# Patient Record
Sex: Female | Born: 1945
Health system: Southern US, Community
[De-identification: ages and names within clinical notes are randomized; demographics above are authoritative.]

## PROBLEM LIST (undated history)

## (undated) DIAGNOSIS — M1990 Unspecified osteoarthritis, unspecified site: Secondary | ICD-10-CM

## (undated) DIAGNOSIS — Z9889 Other specified postprocedural states: Secondary | ICD-10-CM

## (undated) DIAGNOSIS — IMO0001 Reserved for inherently not codable concepts without codable children: Secondary | ICD-10-CM

## (undated) DIAGNOSIS — K219 Gastro-esophageal reflux disease without esophagitis: Secondary | ICD-10-CM

## (undated) DIAGNOSIS — R7302 Impaired glucose tolerance (oral): Secondary | ICD-10-CM

## (undated) DIAGNOSIS — M8580 Other specified disorders of bone density and structure, unspecified site: Secondary | ICD-10-CM

## (undated) DIAGNOSIS — J45909 Unspecified asthma, uncomplicated: Secondary | ICD-10-CM

## (undated) DIAGNOSIS — I10 Essential (primary) hypertension: Secondary | ICD-10-CM

## (undated) DIAGNOSIS — R7301 Impaired fasting glucose: Secondary | ICD-10-CM

## (undated) DIAGNOSIS — F419 Anxiety disorder, unspecified: Secondary | ICD-10-CM

## (undated) DIAGNOSIS — R112 Nausea with vomiting, unspecified: Secondary | ICD-10-CM

## (undated) DIAGNOSIS — J189 Pneumonia, unspecified organism: Secondary | ICD-10-CM

## (undated) DIAGNOSIS — T7840XA Allergy, unspecified, initial encounter: Secondary | ICD-10-CM

## (undated) DIAGNOSIS — E782 Mixed hyperlipidemia: Secondary | ICD-10-CM

## (undated) HISTORY — DX: Allergy, unspecified, initial encounter: T78.40XA

## (undated) HISTORY — DX: Gastro-esophageal reflux disease without esophagitis: K21.9

## (undated) HISTORY — DX: Other specified disorders of bone density and structure, unspecified site: M85.80

## (undated) HISTORY — PX: OTHER SURGICAL HISTORY: SHX169

## (undated) HISTORY — DX: Essential (primary) hypertension: I10

## (undated) HISTORY — DX: Anxiety disorder, unspecified: F41.9

## (undated) HISTORY — DX: Mixed hyperlipidemia: E78.2

## (undated) HISTORY — PX: SHOULDER SURGERY: SHX246

## (undated) HISTORY — DX: Impaired glucose tolerance (oral): R73.02

## (undated) HISTORY — PX: CARPAL TUNNEL RELEASE: SHX101

## (undated) HISTORY — PX: TUBAL LIGATION: SHX77

## (undated) HISTORY — DX: Impaired fasting glucose: R73.01

## (undated) HISTORY — DX: Reserved for inherently not codable concepts without codable children: IMO0001

---

## 2000-10-24 ENCOUNTER — Encounter: Payer: Self-pay | Admitting: *Deleted

## 2000-10-24 ENCOUNTER — Ambulatory Visit (HOSPITAL_COMMUNITY): Admission: RE | Admit: 2000-10-24 | Discharge: 2000-10-24 | Payer: Self-pay | Admitting: *Deleted

## 2001-11-12 ENCOUNTER — Ambulatory Visit (HOSPITAL_COMMUNITY): Admission: RE | Admit: 2001-11-12 | Discharge: 2001-11-12 | Payer: Self-pay | Admitting: *Deleted

## 2001-11-12 ENCOUNTER — Encounter: Payer: Self-pay | Admitting: *Deleted

## 2002-11-18 ENCOUNTER — Encounter: Payer: Self-pay | Admitting: *Deleted

## 2002-11-18 ENCOUNTER — Ambulatory Visit (HOSPITAL_COMMUNITY): Admission: RE | Admit: 2002-11-18 | Discharge: 2002-11-18 | Payer: Self-pay | Admitting: *Deleted

## 2003-03-04 ENCOUNTER — Ambulatory Visit (HOSPITAL_COMMUNITY): Admission: RE | Admit: 2003-03-04 | Discharge: 2003-03-04 | Payer: Self-pay | Admitting: Family Medicine

## 2003-07-22 ENCOUNTER — Ambulatory Visit (HOSPITAL_COMMUNITY): Admission: RE | Admit: 2003-07-22 | Discharge: 2003-07-22 | Payer: Self-pay | Admitting: *Deleted

## 2003-09-14 ENCOUNTER — Ambulatory Visit (HOSPITAL_COMMUNITY): Admission: RE | Admit: 2003-09-14 | Discharge: 2003-09-14 | Payer: Self-pay | Admitting: Internal Medicine

## 2003-12-14 ENCOUNTER — Ambulatory Visit (HOSPITAL_COMMUNITY): Admission: RE | Admit: 2003-12-14 | Discharge: 2003-12-14 | Payer: Self-pay | Admitting: *Deleted

## 2004-01-17 ENCOUNTER — Ambulatory Visit (HOSPITAL_COMMUNITY): Admission: RE | Admit: 2004-01-17 | Discharge: 2004-01-17 | Payer: Self-pay | Admitting: Family Medicine

## 2004-12-14 ENCOUNTER — Ambulatory Visit (HOSPITAL_COMMUNITY): Admission: RE | Admit: 2004-12-14 | Discharge: 2004-12-14 | Payer: Self-pay | Admitting: *Deleted

## 2005-12-17 ENCOUNTER — Ambulatory Visit (HOSPITAL_COMMUNITY): Admission: RE | Admit: 2005-12-17 | Discharge: 2005-12-17 | Payer: Self-pay | Admitting: Obstetrics and Gynecology

## 2006-12-19 ENCOUNTER — Ambulatory Visit (HOSPITAL_COMMUNITY): Admission: RE | Admit: 2006-12-19 | Discharge: 2006-12-19 | Payer: Self-pay | Admitting: Specialist

## 2007-09-03 ENCOUNTER — Other Ambulatory Visit: Admission: RE | Admit: 2007-09-03 | Discharge: 2007-09-03 | Payer: Self-pay | Admitting: Obstetrics and Gynecology

## 2007-12-25 ENCOUNTER — Ambulatory Visit (HOSPITAL_COMMUNITY): Admission: RE | Admit: 2007-12-25 | Discharge: 2007-12-25 | Payer: Self-pay | Admitting: Obstetrics and Gynecology

## 2008-09-03 ENCOUNTER — Other Ambulatory Visit: Admission: RE | Admit: 2008-09-03 | Discharge: 2008-09-03 | Payer: Self-pay | Admitting: Obstetrics and Gynecology

## 2008-12-27 ENCOUNTER — Ambulatory Visit (HOSPITAL_COMMUNITY): Admission: RE | Admit: 2008-12-27 | Discharge: 2008-12-27 | Payer: Self-pay | Admitting: Obstetrics and Gynecology

## 2009-08-17 ENCOUNTER — Other Ambulatory Visit: Admission: RE | Admit: 2009-08-17 | Discharge: 2009-08-17 | Payer: Self-pay | Admitting: Obstetrics and Gynecology

## 2009-12-29 ENCOUNTER — Ambulatory Visit (HOSPITAL_COMMUNITY): Admission: RE | Admit: 2009-12-29 | Discharge: 2009-12-29 | Payer: Self-pay | Admitting: Obstetrics and Gynecology

## 2010-01-04 ENCOUNTER — Ambulatory Visit (HOSPITAL_COMMUNITY): Admission: RE | Admit: 2010-01-04 | Discharge: 2010-01-04 | Payer: Self-pay | Admitting: Obstetrics and Gynecology

## 2010-08-30 ENCOUNTER — Other Ambulatory Visit (HOSPITAL_COMMUNITY)
Admission: RE | Admit: 2010-08-30 | Discharge: 2010-08-30 | Disposition: A | Discharge status: Home / Self Care | Payer: Medicare Other | Source: Ambulatory Visit | Attending: Obstetrics & Gynecology | Admitting: Obstetrics & Gynecology

## 2010-08-30 DIAGNOSIS — Z124 Encounter for screening for malignant neoplasm of cervix: Secondary | ICD-10-CM | POA: Insufficient documentation

## 2010-09-22 NOTE — Op Note (Signed)
Breanna Mason, KEMMERER                       ACCOUNT NO.:  [id number]   MEDICAL RECORD NO.:  [id number]                   PATIENT TYPE:  AMB   LOCATION:  DAY                                  FACILITY:  [facility]   PHYSICIAN:  Lionel December, M.D.                 DATE OF BIRTH:  04-15-1946   DATE OF PROCEDURE:  09/14/2003  DATE OF DISCHARGE:                                 OPERATIVE REPORT   PROCEDURE:  Total colonoscopy.   INDICATIONS:  Kalsey is a 65 year old Caucasian female who is undergoing  screening colonoscopy.  Procedure was reviewed with the patient and informed  consent was obtained.   PREOPERATIVE MEDICATIONS:  Demerol 50 mg IV, Versed 7 mg IV in divided  doses.   FINDINGS:  The procedure was performed in the endoscopy suite.  The  patient's vital signs and O2 saturation were monitored during the procedure  and remained stable.  The patient was placed in the left lateral recumbent  position.  Rectal examination was performed.  No abnormality noted no  external or digital exam.  Olympus video scope was placed in the rectum and  advanced under vision into the sigmoid colon which was somewhat tortuous.  Preparation was satisfactory.  The scope was passed to the cecum which was  identified by the ileocecal valve.  The blunt cecum was well seen and was  normal.  As the scope was withdrawn, colonic mucosa was carefully examined.  There was a single diverticulum at the hepatic flexure.  There was a 3 mm  polyp at proximal transverse colon which was obliterated by cold biopsy.  The mucosa of the rest of the colon was normal.  Scope was retroflexed to  examine the anorectal junction and small hemorrhoids were noted below the  dentate line.  The scope was straightened and withdrawn.  The patient  tolerated the procedure well.   FINAL DIAGNOSIS:  1. Single small polyp at the proximal transverse colon which was obliterated     by cold biopsy.  2. Single diverticulum at the hepatic  flexure.  3. Small external hemorrhoids.   RECOMMENDATIONS:  1. High-fiber diet.  2. I will be contacting the patient with biopsy results and further     recommendations.      ___________________________________________                                            Lionel December, M.D.   NR/MEDQ  D:  09/14/2003  T:  09/14/2003  Job:  [id number]   cc:   Donna Bernard, M.D.  [address]  [place]  [place] [address]  Fax: [phone]

## 2010-11-28 ENCOUNTER — Other Ambulatory Visit: Payer: Self-pay | Admitting: Obstetrics and Gynecology

## 2010-11-28 DIAGNOSIS — Z139 Encounter for screening, unspecified: Secondary | ICD-10-CM

## 2011-01-01 ENCOUNTER — Ambulatory Visit (HOSPITAL_COMMUNITY): Payer: 59

## 2011-01-09 ENCOUNTER — Ambulatory Visit (HOSPITAL_COMMUNITY)
Admission: RE | Admit: 2011-01-09 | Discharge: 2011-01-09 | Disposition: A | Discharge status: Home / Self Care | Payer: Medicare Other | Source: Ambulatory Visit | Attending: Obstetrics and Gynecology | Admitting: Obstetrics and Gynecology

## 2011-01-09 DIAGNOSIS — Z1231 Encounter for screening mammogram for malignant neoplasm of breast: Secondary | ICD-10-CM | POA: Insufficient documentation

## 2011-01-09 DIAGNOSIS — Z139 Encounter for screening, unspecified: Secondary | ICD-10-CM

## 2011-02-16 ENCOUNTER — Encounter: Payer: Self-pay | Admitting: Cardiology

## 2011-03-06 ENCOUNTER — Encounter: Payer: Self-pay | Admitting: Cardiology

## 2011-03-06 ENCOUNTER — Ambulatory Visit (INDEPENDENT_AMBULATORY_CARE_PROVIDER_SITE_OTHER): Payer: Medicare Other | Admitting: Cardiology

## 2011-03-06 DIAGNOSIS — I10 Essential (primary) hypertension: Secondary | ICD-10-CM

## 2011-03-06 DIAGNOSIS — R7302 Impaired glucose tolerance (oral): Secondary | ICD-10-CM | POA: Insufficient documentation

## 2011-03-06 DIAGNOSIS — R079 Chest pain, unspecified: Secondary | ICD-10-CM

## 2011-03-06 DIAGNOSIS — R7309 Other abnormal glucose: Secondary | ICD-10-CM

## 2011-03-06 DIAGNOSIS — E782 Mixed hyperlipidemia: Secondary | ICD-10-CM

## 2011-03-06 DIAGNOSIS — J45909 Unspecified asthma, uncomplicated: Secondary | ICD-10-CM

## 2011-03-06 NOTE — Assessment & Plan Note (Signed)
Could be that some of her symptoms are related to exercise-induced asthma.

## 2011-03-06 NOTE — Progress Notes (Signed)
Clinical Summary Breanna Mason is a 65 y.o.female referred for cardiology consultation by Dr. Gerda Diss. She reports a several month history of exertional chest tightness, specifically when she is walking on her treadmill at home. This occurs when she walks on an incline at speed greater than 3 miles per hour. She states that symptoms last for approximately 30 minutes prior to resolving with rest. She reports a history of asthma, and thought that this may be related. She states that she does not use her rescue inhaler with any regularity, does use Advair. Otherwise when she is walking on level ground, walking outdoors, she reports no exertional chest pain or unusual shortness of breath.  She reports compliance with her medications. Cardiac risk factors are reviewed. She has a low Framingham risk score. Recent labs show total cholesterol 208, HDL 53, triglycerides 92, LDL 137, AST 19, ALT 23.   Allergies  Allergen Reactions  . Penicillins     Medication list reviewed.  Past Medical History  Diagnosis Date  . Essential hypertension, benign   . GERD (gastroesophageal reflux disease)   . Asthma   . Mixed hyperlipidemia   . Impaired glucose tolerance   . Osteoporosis   . Anxiety     Past Surgical History  Procedure Date  . Cesarean section   . Bilateral tubal ligation   . Shoulder surgery     Family History  Problem Relation Age of Onset  . Hypertension Father   . Diabetes type II Father     Social History Breanna Mason reports that she has never smoked. She has never used smokeless tobacco. Breanna Mason reports that she does not drink alcohol.  Review of Systems No cough or hemoptysis. No fevers or chills. Reports stable appetite. No melena or hematochezia. No palpitations or syncope. Otherwise negative.  Physical Examination Filed Vitals:   03/06/11 0837  BP: 116/73  Pulse: 75   Normally nourished appearing woman in no acute distress. HEENT: Strabismus noted, oropharynx  clear with moist mucosa. Neck: Supple, no elevated JVP or carotid bruits, no thyromegaly. Lungs: Clear to auscultation, diminished breath sounds, no wheezing or labored breathing. Cardiac: Regular rate and rhythm, no significant murmur or gallop. Abdomen: Soft, nontender, bowel sounds present. Skin: Warm and dry. Musculoskeletal: No kyphosis. Extremities: No edema, limited range of motion right shoulder. Neuropsychiatric: Alert and oriented x3, affect appropriate.  ECG Recent tracing from October 19 reviewed showing sinus rhythm with normal intervals, nonspecific T-wave changes.   Problem List and Plan

## 2011-03-06 NOTE — Assessment & Plan Note (Signed)
On statin therapy, recent LDL 137. Ideally goal should be under 100.

## 2011-03-06 NOTE — Assessment & Plan Note (Signed)
Suggestive of angina based on description, and with cardiac risk factors including hypertension, hyperlipidemia, and impaired glucose tolerance. Framingham risk score is however low, and there is no definitive family history of premature CAD. Baseline ECG is normal. Further risk stratification is being pursued via exercise Myoview.

## 2011-03-06 NOTE — Assessment & Plan Note (Signed)
Followed by Dr.Luking. Hemoglobin A1c 6.0.

## 2011-03-06 NOTE — Patient Instructions (Signed)
Your physician has requested that you have en exercise stress myoview. For further information please visit [email or web address] Please follow instruction sheet, as given.  Your physician recommends that you continue on your current medications as directed. Please refer to the Current Medication list given to you today.  Your physician recommends that you schedule a follow-up appointment in: We will contact you with results of test

## 2011-03-06 NOTE — Assessment & Plan Note (Signed)
Blood pressure well-controlled today. 

## 2011-03-13 ENCOUNTER — Encounter (HOSPITAL_COMMUNITY)
Admission: RE | Admit: 2011-03-13 | Discharge: 2011-03-13 | Disposition: A | Discharge status: Home / Self Care | Payer: Medicare Other | Source: Ambulatory Visit | Attending: Cardiology | Admitting: Cardiology

## 2011-03-13 ENCOUNTER — Ambulatory Visit (INDEPENDENT_AMBULATORY_CARE_PROVIDER_SITE_OTHER): Payer: Medicare Other | Admitting: *Deleted

## 2011-03-13 ENCOUNTER — Encounter (HOSPITAL_COMMUNITY): Payer: Self-pay | Admitting: Cardiology

## 2011-03-13 DIAGNOSIS — R079 Chest pain, unspecified: Secondary | ICD-10-CM

## 2011-03-13 DIAGNOSIS — I10 Essential (primary) hypertension: Secondary | ICD-10-CM | POA: Insufficient documentation

## 2011-03-13 MED ORDER — TECHNETIUM TC 99M TETROFOSMIN IV KIT
30.0000 | PACK | Freq: Once | INTRAVENOUS | Status: AC | PRN
  Administered 2011-03-13: 30 via INTRAVENOUS

## 2011-03-13 MED ORDER — TECHNETIUM TC 99M TETROFOSMIN IV KIT
10.0000 | PACK | Freq: Once | INTRAVENOUS | Status: AC | PRN
  Administered 2011-03-13: 9.9 via INTRAVENOUS

## 2011-03-13 NOTE — Progress Notes (Signed)
Stress Lab Nurses Notes - [facility]  SUPRENA TRAVAGLINI 03/13/2011  Reason for doing test: Chest Pain  Type of test: Stress Myoview  Nurse performing test: Parke Poisson, RN  Nuclear Medicine Tech: Lyndel Pleasure  Echo Tech: Not Applicable  MD performing test: Ival Bible & Joni Reining, NP  Family MD: Lubertha South  Test explained and consent signed: yes  IV started: 22g jelco, Saline lock flushed, No redness or edema and Saline lock started in radiology  Symptoms: SOB & fatigue  Treatment/Intervention: None  Reason test stopped: fatigue  After recovery IV was: Discontinued via X-ray tech and No redness or edema  Patient to return to Nuc. Med at : 73 Noon  Patient discharged: Home  Patient's Condition upon discharge was: stable  Comments: During test peak BP 178/72 & HR 151.  Recovery BP 128/70 & HR 100.  Symptoms resolved in recovery.  [name]

## 2011-05-15 DIAGNOSIS — IMO0002 Reserved for concepts with insufficient information to code with codable children: Secondary | ICD-10-CM | POA: Diagnosis not present

## 2011-06-21 DIAGNOSIS — H5200 Hypermetropia, unspecified eye: Secondary | ICD-10-CM | POA: Diagnosis not present

## 2011-06-21 DIAGNOSIS — H35369 Drusen (degenerative) of macula, unspecified eye: Secondary | ICD-10-CM | POA: Diagnosis not present

## 2011-06-21 DIAGNOSIS — H524 Presbyopia: Secondary | ICD-10-CM | POA: Diagnosis not present

## 2011-08-27 DIAGNOSIS — J218 Acute bronchiolitis due to other specified organisms: Secondary | ICD-10-CM | POA: Diagnosis not present

## 2011-08-27 DIAGNOSIS — J301 Allergic rhinitis due to pollen: Secondary | ICD-10-CM | POA: Diagnosis not present

## 2011-08-27 DIAGNOSIS — J988 Other specified respiratory disorders: Secondary | ICD-10-CM | POA: Diagnosis not present

## 2011-08-31 DIAGNOSIS — R7301 Impaired fasting glucose: Secondary | ICD-10-CM | POA: Diagnosis not present

## 2011-08-31 DIAGNOSIS — E782 Mixed hyperlipidemia: Secondary | ICD-10-CM | POA: Diagnosis not present

## 2011-08-31 DIAGNOSIS — Z79899 Other long term (current) drug therapy: Secondary | ICD-10-CM | POA: Diagnosis not present

## 2011-09-06 DIAGNOSIS — I10 Essential (primary) hypertension: Secondary | ICD-10-CM | POA: Diagnosis not present

## 2011-09-06 DIAGNOSIS — R7301 Impaired fasting glucose: Secondary | ICD-10-CM | POA: Diagnosis not present

## 2011-09-06 DIAGNOSIS — J45909 Unspecified asthma, uncomplicated: Secondary | ICD-10-CM | POA: Diagnosis not present

## 2011-09-06 DIAGNOSIS — E785 Hyperlipidemia, unspecified: Secondary | ICD-10-CM | POA: Diagnosis not present

## 2011-09-20 DIAGNOSIS — L570 Actinic keratosis: Secondary | ICD-10-CM | POA: Diagnosis not present

## 2011-12-10 ENCOUNTER — Other Ambulatory Visit: Payer: Self-pay | Admitting: Obstetrics and Gynecology

## 2011-12-10 DIAGNOSIS — Z139 Encounter for screening, unspecified: Secondary | ICD-10-CM

## 2012-01-14 ENCOUNTER — Ambulatory Visit (HOSPITAL_COMMUNITY)
Admission: RE | Admit: 2012-01-14 | Discharge: 2012-01-14 | Disposition: A | Discharge status: Home / Self Care | Payer: Medicare Other | Source: Ambulatory Visit | Attending: Obstetrics and Gynecology | Admitting: Obstetrics and Gynecology

## 2012-01-14 DIAGNOSIS — Z1231 Encounter for screening mammogram for malignant neoplasm of breast: Secondary | ICD-10-CM | POA: Diagnosis not present

## 2012-01-14 DIAGNOSIS — Z139 Encounter for screening, unspecified: Secondary | ICD-10-CM

## 2012-01-24 DIAGNOSIS — M19019 Primary osteoarthritis, unspecified shoulder: Secondary | ICD-10-CM | POA: Diagnosis not present

## 2012-02-27 DIAGNOSIS — Z79899 Other long term (current) drug therapy: Secondary | ICD-10-CM | POA: Diagnosis not present

## 2012-02-27 DIAGNOSIS — R7301 Impaired fasting glucose: Secondary | ICD-10-CM | POA: Diagnosis not present

## 2012-02-27 DIAGNOSIS — E782 Mixed hyperlipidemia: Secondary | ICD-10-CM | POA: Diagnosis not present

## 2012-03-04 DIAGNOSIS — Z23 Encounter for immunization: Secondary | ICD-10-CM | POA: Diagnosis not present

## 2012-03-05 DIAGNOSIS — N390 Urinary tract infection, site not specified: Secondary | ICD-10-CM | POA: Diagnosis not present

## 2012-03-19 DIAGNOSIS — I10 Essential (primary) hypertension: Secondary | ICD-10-CM | POA: Diagnosis not present

## 2012-03-19 DIAGNOSIS — E785 Hyperlipidemia, unspecified: Secondary | ICD-10-CM | POA: Diagnosis not present

## 2012-03-19 DIAGNOSIS — R7301 Impaired fasting glucose: Secondary | ICD-10-CM | POA: Diagnosis not present

## 2012-05-08 DIAGNOSIS — K2970 Gastritis, unspecified, without bleeding: Secondary | ICD-10-CM | POA: Diagnosis not present

## 2012-08-14 DIAGNOSIS — Z0289 Encounter for other administrative examinations: Secondary | ICD-10-CM

## 2012-08-18 ENCOUNTER — Telehealth: Payer: Self-pay | Admitting: Family Medicine

## 2012-08-18 DIAGNOSIS — E782 Mixed hyperlipidemia: Secondary | ICD-10-CM

## 2012-08-18 DIAGNOSIS — Z79899 Other long term (current) drug therapy: Secondary | ICD-10-CM

## 2012-08-18 NOTE — Telephone Encounter (Signed)
Patient needs BW paperwork °

## 2012-08-19 DIAGNOSIS — Z79899 Other long term (current) drug therapy: Secondary | ICD-10-CM | POA: Diagnosis not present

## 2012-08-19 DIAGNOSIS — E782 Mixed hyperlipidemia: Secondary | ICD-10-CM | POA: Diagnosis not present

## 2012-08-19 NOTE — Telephone Encounter (Signed)
Lip liv met 7 

## 2012-08-19 NOTE — Telephone Encounter (Signed)
bw papers ready to be pick up. Pt notified on voicemail

## 2012-08-26 DIAGNOSIS — Z79899 Other long term (current) drug therapy: Secondary | ICD-10-CM | POA: Diagnosis not present

## 2012-08-26 DIAGNOSIS — E782 Mixed hyperlipidemia: Secondary | ICD-10-CM | POA: Diagnosis not present

## 2012-08-26 LAB — HEPATIC FUNCTION PANEL
ALT: 24 U/L (ref 0–35)
AST: 18 U/L (ref 0–37)
Albumin: 4.2 g/dL (ref 3.5–5.2)
Alkaline Phosphatase: 38 U/L — ABNORMAL LOW (ref 39–117)
Bilirubin, Direct: 0.1 mg/dL (ref 0.0–0.3)
Indirect Bilirubin: 0.5 mg/dL (ref 0.0–0.9)
Total Bilirubin: 0.6 mg/dL (ref 0.3–1.2)
Total Protein: 6.6 g/dL (ref 6.0–8.3)

## 2012-08-26 LAB — LIPID PANEL
Cholesterol: 172 mg/dL (ref 0–200)
HDL: 51 mg/dL (ref >39)
LDL Cholesterol: 106 mg/dL — ABNORMAL HIGH (ref 0–99)
Total CHOL/HDL Ratio: 3.4 Ratio
Triglycerides: 74 mg/dL (ref <150)
VLDL: 15 mg/dL (ref 0–40)

## 2012-08-26 LAB — BASIC METABOLIC PANEL
BUN: 15 mg/dL (ref 6–23)
CO2: 25 mEq/L (ref 19–32)
Calcium: 9 mg/dL (ref 8.4–10.5)
Chloride: 107 mEq/L (ref 96–112)
Creat: 0.67 mg/dL (ref 0.50–1.10)
Glucose, Bld: 112 mg/dL — ABNORMAL HIGH (ref 70–99)
Potassium: 4.3 mEq/L (ref 3.5–5.3)
Sodium: 139 mEq/L (ref 135–145)

## 2012-09-05 ENCOUNTER — Encounter: Payer: Self-pay | Admitting: Family Medicine

## 2012-09-05 ENCOUNTER — Ambulatory Visit (INDEPENDENT_AMBULATORY_CARE_PROVIDER_SITE_OTHER): Payer: Medicare Other | Admitting: Family Medicine

## 2012-09-05 VITALS — BP 108/77 | HR 60 | Wt 147.8 lb

## 2012-09-05 DIAGNOSIS — E782 Mixed hyperlipidemia: Secondary | ICD-10-CM

## 2012-09-05 DIAGNOSIS — R7302 Impaired glucose tolerance (oral): Secondary | ICD-10-CM

## 2012-09-05 DIAGNOSIS — J45909 Unspecified asthma, uncomplicated: Secondary | ICD-10-CM | POA: Diagnosis not present

## 2012-09-05 DIAGNOSIS — K219 Gastro-esophageal reflux disease without esophagitis: Secondary | ICD-10-CM

## 2012-09-05 DIAGNOSIS — I10 Essential (primary) hypertension: Secondary | ICD-10-CM | POA: Diagnosis not present

## 2012-09-05 DIAGNOSIS — R7309 Other abnormal glucose: Secondary | ICD-10-CM

## 2012-09-05 NOTE — Progress Notes (Signed)
  Subjective:    Patient ID: Breanna Mason, female    DOB: 1945/09/16, 67 y.o.   MRN: [id number]  HPI  patient arrives office for followup of many concerns. She claims compliance with all of her medications. No obvious side effects from the medicine.  Patient has chronic rhinitis. This is also complicated by true asthma. Patient reports compliance with her medicines. Overall her breathing is doing well.  Patient claims compliance with her blood pressure medicine. No headache or chest pain. Decent appetite. Walking some. Watch her salt intake.  Patient reports her reflux is stable as long she stays on her medication.  Trying to watch fats in her diet has cut down her cholesterol intake. Exercising regularly.    Review of Systems    review systems otherwise negative. Objective:   Physical Exam Alert no acute distress. Vitals reviewed. HEENT normal. Lungs clear. Heart regular in rhythm. Abdomen benign. Neuro intact. Ankles without edema.   Results for orders placed in visit on 08/18/12  LIPID PANEL      Result Value Range   Cholesterol 172  0 - 200 mg/dL   Triglycerides 74  <782 mg/dL   HDL 51  >95 mg/dL   Total CHOL/HDL Ratio 3.4     VLDL 15  0 - 40 mg/dL   LDL Cholesterol 621 (*) 0 - 99 mg/dL  HEPATIC FUNCTION PANEL      Result Value Range   Total Bilirubin 0.6  0.3 - 1.2 mg/dL   Bilirubin, Direct 0.1  0.0 - 0.3 mg/dL   Indirect Bilirubin 0.5  0.0 - 0.9 mg/dL   Alkaline Phosphatase 38 (*) 39 - 117 U/L   AST 18  0 - 37 U/L   ALT 24  0 - 35 U/L   Total Protein 6.6  6.0 - 8.3 g/dL   Albumin 4.2  3.5 - 5.2 g/dL  BASIC METABOLIC PANEL      Result Value Range   Sodium 139  135 - 145 mEq/L   Potassium 4.3  3.5 - 5.3 mEq/L   Chloride 107  96 - 112 mEq/L   CO2 25  19 - 32 mEq/L   Glucose, Bld 112 (*) 70 - 99 mg/dL   BUN 15  6 - 23 mg/dL   Creat 3.08  6.57 - 8.46 mg/dL   Calcium 9.0  8.4 - 96.2 mg/dL       Assessment & Plan:  Impression 1 hypertension good control. #2  hyperlipidemia good control result reviewed. #3 glucose intolerance ongoing discussed. #4 reflux clinically stable. #5 asthma stable. Plan maintain all meds. Diet exercise discussed. Recheck in 6 months. Medications maintained. WSL

## 2012-09-07 DIAGNOSIS — K219 Gastro-esophageal reflux disease without esophagitis: Secondary | ICD-10-CM | POA: Insufficient documentation

## 2012-09-09 ENCOUNTER — Encounter: Payer: Self-pay | Admitting: *Deleted

## 2012-10-15 ENCOUNTER — Other Ambulatory Visit: Payer: Self-pay | Admitting: Obstetrics and Gynecology

## 2012-10-17 ENCOUNTER — Other Ambulatory Visit (HOSPITAL_COMMUNITY)
Admission: RE | Admit: 2012-10-17 | Discharge: 2012-10-17 | Disposition: A | Discharge status: Home / Self Care | Payer: Medicare Other | Source: Ambulatory Visit | Attending: Obstetrics and Gynecology | Admitting: Obstetrics and Gynecology

## 2012-10-17 ENCOUNTER — Encounter: Payer: Self-pay | Admitting: Obstetrics and Gynecology

## 2012-10-17 ENCOUNTER — Ambulatory Visit (INDEPENDENT_AMBULATORY_CARE_PROVIDER_SITE_OTHER): Payer: Medicare Other | Admitting: Obstetrics and Gynecology

## 2012-10-17 VITALS — BP 120/80 | Ht 63.0 in | Wt 152.2 lb

## 2012-10-17 DIAGNOSIS — Z124 Encounter for screening for malignant neoplasm of cervix: Secondary | ICD-10-CM

## 2012-10-17 DIAGNOSIS — Z01419 Encounter for gynecological examination (general) (routine) without abnormal findings: Secondary | ICD-10-CM | POA: Insufficient documentation

## 2012-10-17 DIAGNOSIS — Z1151 Encounter for screening for human papillomavirus (HPV): Secondary | ICD-10-CM | POA: Diagnosis not present

## 2012-10-17 NOTE — Progress Notes (Signed)
Patient ID: Breanna Mason, female   DOB: 24-Jan-1946, 67 y.o.   MRN: [id number] Pt here today for annual pap and physical. Pt states she is doing well, has some burning with intercourse. Pt denies any issues at this time.    [facility] Visit  Patient name: Breanna Mason MRN [id number]  Date of birth: Jul 05, 1945  CC & HPI:  Breanna Mason is a 67 y.o. female presenting today for annual visit No hormone tx, none desired No gyn probs at present  ROS:    Pertinent History Reviewed:  Medical & Surgical Hx:  Reviewed: Significant for see pmh Medications: Reviewed & Updated - see associated section Social History: Reviewed -  reports that she has never smoked. She has never used smokeless tobacco.  Objective Findings:  Vitals: BP 120/80  Ht 5\' 3"  (1.6 m)  Wt 69.037 kg (152 lb 3.2 oz)  BMI 26.97 kg/m2  Physical Examination: General appearance - alert, well appearing, and in no distress, oriented to person, place, and time, normal appearing weight and well hydrated Mental status - alert, oriented to person, place, and time, normal mood, behavior, speech, dress, motor activity, and thought processes Mouth - mucous membranes moist, pharynx normal without lesions Neck - supple, no significant adenopathy Heart - normal rate and regular rhythm Abdomen - soft, nontender, nondistended, no masses or organomegaly Breasts - breasts appear normal, no suspicious masses, no skin or nipple changes or axillary nodes Pelvic - normal external genitalia, vulva, vagina, cervix, uterus and adnexa Extremities - peripheral pulses normal, no pedal edema, no clubbing or cyanosis, no pedal edema noted    Assessment & Plan:    gyn exam with pap Labs thru work/ primary care Pap q 3-5 yr

## 2012-10-24 ENCOUNTER — Other Ambulatory Visit: Payer: Medicare Other | Admitting: Obstetrics and Gynecology

## 2012-11-03 ENCOUNTER — Other Ambulatory Visit: Payer: Self-pay

## 2012-11-03 MED ORDER — PRAVASTATIN SODIUM 40 MG PO TABS: 40.0000 mg | ORAL_TABLET | Freq: Every day | ORAL | Status: DC

## 2012-11-24 ENCOUNTER — Other Ambulatory Visit: Payer: Self-pay | Admitting: Family Medicine

## 2012-12-10 ENCOUNTER — Other Ambulatory Visit: Payer: Self-pay

## 2012-12-15 ENCOUNTER — Other Ambulatory Visit: Payer: Self-pay | Admitting: Obstetrics and Gynecology

## 2012-12-15 DIAGNOSIS — Z139 Encounter for screening, unspecified: Secondary | ICD-10-CM

## 2013-01-16 ENCOUNTER — Ambulatory Visit (HOSPITAL_COMMUNITY)
Admission: RE | Admit: 2013-01-16 | Discharge: 2013-01-16 | Disposition: A | Discharge status: Home / Self Care | Payer: Medicare Other | Source: Ambulatory Visit | Attending: Obstetrics and Gynecology | Admitting: Obstetrics and Gynecology

## 2013-01-16 DIAGNOSIS — Z1231 Encounter for screening mammogram for malignant neoplasm of breast: Secondary | ICD-10-CM | POA: Diagnosis not present

## 2013-01-16 DIAGNOSIS — Z139 Encounter for screening, unspecified: Secondary | ICD-10-CM

## 2013-01-26 ENCOUNTER — Other Ambulatory Visit: Payer: Self-pay | Admitting: Obstetrics and Gynecology

## 2013-01-26 ENCOUNTER — Other Ambulatory Visit: Payer: Self-pay | Admitting: Family Medicine

## 2013-01-26 NOTE — Telephone Encounter (Signed)
Ok plus 5 monthly ref 

## 2013-01-30 ENCOUNTER — Other Ambulatory Visit: Payer: Self-pay | Admitting: *Deleted

## 2013-01-30 ENCOUNTER — Other Ambulatory Visit: Payer: Self-pay | Admitting: Family Medicine

## 2013-01-30 ENCOUNTER — Telehealth: Payer: Self-pay | Admitting: Family Medicine

## 2013-01-30 MED ORDER — ALPRAZOLAM 0.5 MG PO TABS: ORAL_TABLET | ORAL | Status: DC

## 2013-01-30 NOTE — Telephone Encounter (Signed)
Rx was already faxed to pharmacy.  Patient notified.

## 2013-01-30 NOTE — Telephone Encounter (Signed)
Ok plus 2 ref 

## 2013-01-30 NOTE — Telephone Encounter (Signed)
Patient needs Rx for alprazolam to [facility] is saying they do not have it.

## 2013-02-09 ENCOUNTER — Telehealth: Payer: Self-pay | Admitting: Family Medicine

## 2013-02-09 DIAGNOSIS — Z79899 Other long term (current) drug therapy: Secondary | ICD-10-CM

## 2013-02-09 DIAGNOSIS — E782 Mixed hyperlipidemia: Secondary | ICD-10-CM

## 2013-02-09 NOTE — Telephone Encounter (Signed)
Patient has appointment 02/25/2013 for Follow Up.  Patient would like to have blood work completed this coming Saturday 02/14/2013.  Please call Patient when this has been called to the lab.

## 2013-02-12 NOTE — Telephone Encounter (Signed)
FYI, patient wants done at [facility]

## 2013-02-12 NOTE — Telephone Encounter (Signed)
Lip liv m7 

## 2013-02-13 DIAGNOSIS — Z23 Encounter for immunization: Secondary | ICD-10-CM | POA: Diagnosis not present

## 2013-02-13 NOTE — Telephone Encounter (Signed)
Blood work ordered in Epic. Patient notified. 

## 2013-02-17 DIAGNOSIS — Z79899 Other long term (current) drug therapy: Secondary | ICD-10-CM | POA: Diagnosis not present

## 2013-02-17 DIAGNOSIS — E782 Mixed hyperlipidemia: Secondary | ICD-10-CM | POA: Diagnosis not present

## 2013-02-18 LAB — LIPID PANEL
HDL: 52 mg/dL (ref >39)
LDL Cholesterol: 123 mg/dL — ABNORMAL HIGH (ref 0–99)
Triglycerides: 87 mg/dL (ref <150)

## 2013-02-18 LAB — HEPATIC FUNCTION PANEL
ALT: 19 U/L (ref 0–35)
Bilirubin, Direct: 0.1 mg/dL (ref 0.0–0.3)
Total Bilirubin: 0.4 mg/dL (ref 0.3–1.2)
Total Protein: 6.5 g/dL (ref 6.0–8.3)

## 2013-02-18 LAB — BASIC METABOLIC PANEL
CO2: 28 mEq/L (ref 19–32)
Glucose, Bld: 100 mg/dL — ABNORMAL HIGH (ref 70–99)
Potassium: 4.3 mEq/L (ref 3.5–5.3)
Sodium: 141 mEq/L (ref 135–145)

## 2013-02-25 ENCOUNTER — Ambulatory Visit (INDEPENDENT_AMBULATORY_CARE_PROVIDER_SITE_OTHER): Payer: Medicare Other | Admitting: Family Medicine

## 2013-02-25 ENCOUNTER — Encounter: Payer: Self-pay | Admitting: Family Medicine

## 2013-02-25 VITALS — BP 128/82 | Ht 63.0 in | Wt 145.0 lb

## 2013-02-25 DIAGNOSIS — J45909 Unspecified asthma, uncomplicated: Secondary | ICD-10-CM | POA: Diagnosis not present

## 2013-02-25 DIAGNOSIS — I10 Essential (primary) hypertension: Secondary | ICD-10-CM | POA: Diagnosis not present

## 2013-02-25 DIAGNOSIS — R7309 Other abnormal glucose: Secondary | ICD-10-CM

## 2013-02-25 DIAGNOSIS — E782 Mixed hyperlipidemia: Secondary | ICD-10-CM

## 2013-02-25 DIAGNOSIS — K219 Gastro-esophageal reflux disease without esophagitis: Secondary | ICD-10-CM | POA: Diagnosis not present

## 2013-02-25 DIAGNOSIS — R7302 Impaired glucose tolerance (oral): Secondary | ICD-10-CM

## 2013-02-25 MED ORDER — ENALAPRIL MALEATE 10 MG PO TABS: ORAL_TABLET | ORAL | Status: DC

## 2013-02-25 MED ORDER — RANITIDINE HCL 300 MG PO CAPS: 300.0000 mg | ORAL_CAPSULE | Freq: Every evening | ORAL | Status: DC

## 2013-02-25 MED ORDER — PRAVASTATIN SODIUM 40 MG PO TABS: 40.0000 mg | ORAL_TABLET | Freq: Every day | ORAL | Status: DC

## 2013-02-25 NOTE — Progress Notes (Signed)
  Subjective:    Patient ID: Breanna Mason, female    DOB: Dec 27, 1945, 67 y.o.   MRN: [id number]  HPI Patient arrives for a follow up on hypertension. Claims compliance with her medication. Blood pressure good when checked elsewhere.  Also worked on sugar intake. Family history diabetes. Patient has a problem with sugar.  Claims overall good control of her asthma. Only has to use inhaler very rarely.   Claims compliance with her lipid medication. Trying to watch diet in that regard. No obvious side effects.  No major difficulties with reflux. Last flu shot last week pneumonia shot within the last 3 years. Working hard on diet. Had lost weight.   Exercise so so. an   Results for orders placed in visit on 02/09/13  LIPID PANEL      Result Value Range   Cholesterol 192  0 - 200 mg/dL   Triglycerides 87  <409 mg/dL   HDL 52  >81 mg/dL   Total CHOL/HDL Ratio 3.7     VLDL 17  0 - 40 mg/dL   LDL Cholesterol 191 (*) 0 - 99 mg/dL  HEPATIC FUNCTION PANEL      Result Value Range   Total Bilirubin 0.4  0.3 - 1.2 mg/dL   Bilirubin, Direct 0.1  0.0 - 0.3 mg/dL   Indirect Bilirubin 0.3  0.0 - 0.9 mg/dL   Alkaline Phosphatase 44  39 - 117 U/L   AST 17  0 - 37 U/L   ALT 19  0 - 35 U/L   Total Protein 6.5  6.0 - 8.3 g/dL   Albumin 4.0  3.5 - 5.2 g/dL  BASIC METABOLIC PANEL      Result Value Range   Sodium 141  135 - 145 mEq/L   Potassium 4.3  3.5 - 5.3 mEq/L   Chloride 105  96 - 112 mEq/L   CO2 28  19 - 32 mEq/L   Glucose, Bld 100 (*) 70 - 99 mg/dL   BUN 17  6 - 23 mg/dL   Creat 4.78  2.95 - 6.21 mg/dL   Calcium 8.8  8.4 - 30.8 mg/dL   discuss recent lab work.   Review of Systems No chest pain no headache no back pain no abdominal pain no change in bowel habits no blood in stool no weight loss weight gain no excess fever or fatigue ROS otherwise negative    Objective:   Physical Exam  Alert blood pressure excellent on repeat HEENT normal. Lungs clear. Heart regular in  rhythm. Ankles without edema.      Assessment & Plan:  Impression 1 asthma clinically stable. #2 hypertension good control. Number hyperlipidemia good control. #4 impaired fasting glucose improved discussed with patient. #5 Zantac stable on meds discuss. #6 osteoporosis stable on meds. Plan more exercises warranted discussed at length maintain same medications check every 6 months 25 minutes spent most in discussion. WSL

## 2013-03-03 ENCOUNTER — Other Ambulatory Visit: Payer: Self-pay | Admitting: *Deleted

## 2013-03-03 MED ORDER — RANITIDINE HCL 300 MG PO CAPS: 300.0000 mg | ORAL_CAPSULE | Freq: Every evening | ORAL | Status: DC

## 2013-03-03 MED ORDER — ENALAPRIL MALEATE 10 MG PO TABS: ORAL_TABLET | ORAL | Status: DC

## 2013-03-03 MED ORDER — PRAVASTATIN SODIUM 40 MG PO TABS: 40.0000 mg | ORAL_TABLET | Freq: Every day | ORAL | Status: DC

## 2013-04-10 ENCOUNTER — Ambulatory Visit (INDEPENDENT_AMBULATORY_CARE_PROVIDER_SITE_OTHER): Payer: Medicare Other | Admitting: Family Medicine

## 2013-04-10 ENCOUNTER — Emergency Department (HOSPITAL_COMMUNITY)
Admission: EM | Admit: 2013-04-10 | Discharge: 2013-04-10 | Discharge status: Against Medical Advice | Payer: Medicare Other | Attending: Emergency Medicine | Admitting: Emergency Medicine

## 2013-04-10 ENCOUNTER — Encounter: Payer: Self-pay | Admitting: Family Medicine

## 2013-04-10 ENCOUNTER — Emergency Department (HOSPITAL_COMMUNITY): Payer: Medicare Other

## 2013-04-10 ENCOUNTER — Encounter (HOSPITAL_COMMUNITY): Payer: Self-pay | Admitting: Emergency Medicine

## 2013-04-10 VITALS — BP 110/78 | Temp 97.8°F | Ht 63.0 in | Wt 147.1 lb

## 2013-04-10 DIAGNOSIS — R0789 Other chest pain: Secondary | ICD-10-CM

## 2013-04-10 DIAGNOSIS — F411 Generalized anxiety disorder: Secondary | ICD-10-CM | POA: Diagnosis not present

## 2013-04-10 DIAGNOSIS — R079 Chest pain, unspecified: Secondary | ICD-10-CM

## 2013-04-10 DIAGNOSIS — M899 Disorder of bone, unspecified: Secondary | ICD-10-CM | POA: Diagnosis not present

## 2013-04-10 DIAGNOSIS — Z79899 Other long term (current) drug therapy: Secondary | ICD-10-CM | POA: Diagnosis not present

## 2013-04-10 DIAGNOSIS — K219 Gastro-esophageal reflux disease without esophagitis: Secondary | ICD-10-CM | POA: Insufficient documentation

## 2013-04-10 DIAGNOSIS — Z880 Allergy status to penicillin: Secondary | ICD-10-CM | POA: Diagnosis not present

## 2013-04-10 DIAGNOSIS — J45901 Unspecified asthma with (acute) exacerbation: Secondary | ICD-10-CM | POA: Insufficient documentation

## 2013-04-10 DIAGNOSIS — I10 Essential (primary) hypertension: Secondary | ICD-10-CM | POA: Diagnosis not present

## 2013-04-10 DIAGNOSIS — E785 Hyperlipidemia, unspecified: Secondary | ICD-10-CM | POA: Diagnosis not present

## 2013-04-10 LAB — BASIC METABOLIC PANEL
BUN: 14 mg/dL (ref 6–23)
Chloride: 104 mEq/L (ref 96–112)
Creatinine, Ser: 0.71 mg/dL (ref 0.50–1.10)
GFR calc Af Amer: >90 mL/min (ref >90)
GFR calc non Af Amer: 88 mL/min — ABNORMAL LOW (ref >90)
Glucose, Bld: 98 mg/dL (ref 70–99)

## 2013-04-10 LAB — CBC WITH DIFFERENTIAL/PLATELET
Basophils Absolute: 0 10*3/uL (ref 0.0–0.1)
Basophils Relative: 0 % (ref 0–1)
Eosinophils Absolute: 0.1 10*3/uL (ref 0.0–0.7)
HCT: 41.7 % (ref 36.0–46.0)
Hemoglobin: 14.3 g/dL (ref 12.0–15.0)
Lymphs Abs: 1.7 10*3/uL (ref 0.7–4.0)
MCH: 31.3 pg (ref 26.0–34.0)
MCHC: 34.3 g/dL (ref 30.0–36.0)
Monocytes Absolute: 0.7 10*3/uL (ref 0.1–1.0)
Monocytes Relative: 13 % — ABNORMAL HIGH (ref 3–12)
Neutro Abs: 3.1 10*3/uL (ref 1.7–7.7)
RBC: 4.57 MIL/uL (ref 3.87–5.11)

## 2013-04-10 LAB — HEPATIC FUNCTION PANEL
ALT: 29 U/L (ref 0–35)
AST: 22 U/L (ref 0–37)
Albumin: 4.2 g/dL (ref 3.5–5.2)
Alkaline Phosphatase: 57 U/L (ref 39–117)
Bilirubin, Direct: <0.1 mg/dL (ref 0.0–0.3)
Total Bilirubin: 0.2 mg/dL — ABNORMAL LOW (ref 0.3–1.2)
Total Protein: 7.4 g/dL (ref 6.0–8.3)

## 2013-04-10 MED ORDER — OMEPRAZOLE 20 MG PO CPDR: 20.0000 mg | DELAYED_RELEASE_CAPSULE | Freq: Every day | ORAL | Status: DC

## 2013-04-10 NOTE — ED Provider Notes (Addendum)
CSN: [id number]     Arrival date & time 04/10/13  1215 History  This chart was scribed for Glynn Octave, MD by Leone Payor, ED Scribe. This patient was seen in room [facility]12[facility] and the patient's care was started 1:34 PM.    Chief Complaint  Patient presents with  . Chest Pain    The history is provided by the patient. No language interpreter was used.    HPI Comments: Breanna Mason is a 67 y.o. female with past medical history of HTN, GERD who presents to the Emergency Department complaining of intermittent episodes of central, non-radiating chest pain that began 5 days ago. She states this pain is usually worse at night. She states the last episode was early this morning and lasted for 7 hours with some improvement in intensity. She had similar episode occurring 4 days ago. Pt states she has associated SOB with the chest pains as well as a mild cough. She reports having a stress test several years ago. She denies history of MI.    Past Medical History  Diagnosis Date  . Essential hypertension, benign   . GERD (gastroesophageal reflux disease)   . Asthma   . Mixed hyperlipidemia   . Impaired glucose tolerance   . Osteoporosis   . Anxiety   . Osteopenia   . Reflux   . IFG (impaired fasting glucose)   . Allergy    Past Surgical History  Procedure Laterality Date  . Cesarean section    . Bilateral tubal ligation    . Shoulder surgery     Family History  Problem Relation Age of Onset  . Hypertension Father   . Diabetes type II Father    History  Substance Use Topics  . Smoking status: Never Smoker   . Smokeless tobacco: Never Used  . Alcohol Use: No   OB History   Grav Para Term Preterm Abortions TAB SAB Ect Mult Living                 Review of Systems A complete 10 system review of systems was obtained and all systems are negative except as noted in the HPI and PMH.   Allergies  Penicillins  Home Medications   Current Outpatient Rx  Name  Route  Sig   Dispense  Refill  . alendronate (FOSAMAX) 70 MG tablet      TAKE ONE TABLET BY MOUTH ONCE A WEEK   14 tablet   3   . ALPRAZolam (XANAX) 0.5 MG tablet      TAKE ONE TABLET BY MOUTH EVERY DAY AS NEEDED FOR ANXIETY   30 tablet   2     May fill monthly   . calcium-vitamin D (OSCAL WITH D) 500-200 MG-UNIT per tablet   Oral   Take 1 tablet by mouth daily.           . enalapril (VASOTEC) 10 MG tablet      TAKE ONE TABLET BY MOUTH EVERY DAY   90 tablet   1   . meclizine (ANTIVERT) 25 MG tablet   Oral   Take 25 mg by mouth as needed.           . pravastatin (PRAVACHOL) 40 MG tablet   Oral   Take 1 tablet (40 mg total) by mouth daily.   90 tablet   1   . omeprazole (PRILOSEC) 20 MG capsule   Oral   Take 1 capsule (20 mg total) by mouth daily.  30 capsule   0    BP 154/80  Pulse 82  Temp(Src) 98.1 F (36.7 C) (Oral)  Resp 22  Ht 5\' 3"  (1.6 m)  Wt 147 lb (66.679 kg)  BMI 26.05 kg/m2  SpO2 95% Physical Exam  Nursing note and vitals reviewed. Constitutional: She is oriented to person, place, and time. She appears well-developed and well-nourished. No distress.  HENT:  Head: Normocephalic and atraumatic.  Eyes: EOM are normal.  Neck: Neck supple. No tracheal deviation present.  Cardiovascular: Normal rate, regular rhythm and normal heart sounds.   Pulmonary/Chest: Effort normal and breath sounds normal. No respiratory distress. She has no wheezes. She has no rales. She exhibits no tenderness.  Abdominal: Soft. Bowel sounds are normal. There is no tenderness (no RUQ or epigastric tenderness).  Musculoskeletal: Normal range of motion.  Neurological: She is alert and oriented to person, place, and time.  Skin: Skin is warm and dry.  Psychiatric: She has a normal mood and affect. Her behavior is normal.    ED Course  Procedures   DIAGNOSTIC STUDIES: Oxygen Saturation is 100% on RA, normal by my interpretation.    COORDINATION OF CARE: 1:38 PM Will order  CXR, Hepatic function panel, Lipase, CBC, BMP, Troponin. Discussed treatment plan with pt at bedside and pt agreed to plan.   Labs Review Labs Reviewed  CBC WITH DIFFERENTIAL - Abnormal; Notable for the following:    Monocytes Relative 13 (*)    All other components within normal limits  BASIC METABOLIC PANEL - Abnormal; Notable for the following:    GFR calc non Af Amer 88 (*)    All other components within normal limits  HEPATIC FUNCTION PANEL - Abnormal; Notable for the following:    Total Bilirubin 0.2 (*)    All other components within normal limits  TROPONIN I  LIPASE, BLOOD  TROPONIN I   Imaging Review Dg Chest 2 View  04/10/2013   CLINICAL DATA:  68 year old female chest pain, hypertension. Initial encounter.  EXAM: CHEST  2 VIEW  COMPARISON:  None.  FINDINGS: Lung volumes at the upper limits of normal. Normal cardiac size and mediastinal contours. Visualized tracheal air column is within normal limits. No pneumothorax, pulmonary edema, pleural effusion or confluent pulmonary opacity. Postoperative changes to the right shoulder and proximal right humerus. No acute osseous abnormality identified.  IMPRESSION: No acute cardiopulmonary abnormality.   Electronically Signed   By: Augusto Gamble M.D.   On: 04/10/2013 13:03    EKG Interpretation    Date/Time:  Friday April 10 2013 12:22:20 EST Ventricular Rate:  82 PR Interval:  152 QRS Duration: 94 QT Interval:  396 QTC Calculation: 462 R Axis:   -4 Text Interpretation:  Normal sinus rhythm Normal ECG No previous ECGs available No previous ECGs available Confirmed by Manus Gunning  MD,  ([id number]) on 04/10/2013 2:19:18 PM            MDM   1. Atypical chest pain    Patient with episodes of chest pain have been intermittent for the past week her in the center of her chest and lasts for several hours at a time it is does not radiate. It is worse at night. He goes away with burping. Denies any cardiac history. She has a history  of hyperlipidemia acid reflux. She sent by Dr. looking for evaluation. No chest pain currently.  EKG is normal. Patient had a stress test in 2012 negative. IMPRESSION:  Low risk exercise Myoview. No clearly diagnostic  ST-segment  changes in the setting of lead motion artifact. Hypertensive blood  pressure response was noted. Perfusion imaging shows evidence of  breast tissue attenuation, however no definite ischemia or scar.  LVEF is normal at 77% without focal wall motion abnormality  Her chest pain seems atypical for ACS. It is not exertional. This morning the pain was ongoing for 4 AM to 11 AM. Troponin is negative.  Patient is asymptomatic and wants to go home. She is agreeable to second troponin. She was offered observation for complete cardiac rule out which she declined. She is advised to schedule stress test with Dr. Diona Browner. She'll be started on PPI for gastritis/esophagitis.  Patient is unwilling to wait for second troponin and will leave AMA. She understands the risks including heart attack and death and has capacity to make decisions.  I personally performed the services described in this documentation, which was scribed in my presence. The recorded information has been reviewed and is accurate.   Glynn Octave, MD 04/10/13 1611  Glynn Octave, MD 04/11/13 [phone]

## 2013-04-10 NOTE — ED Notes (Signed)
Pt not happy with care. States she was made to come here and nothing has been done for her. Nurse reviewed all result with pt and family member. Pt is waiting on troponin to be drawn. Pt made aware

## 2013-04-10 NOTE — ED Notes (Addendum)
Pt refused to wait for 2nd Troponin.  Notified edp.  Explained to pt that if she leaves AMA she's at risk for her condition worsening or death.  Pt reports that she understands.

## 2013-04-10 NOTE — ED Notes (Signed)
Pt c/o chest pain that gets worse at night for the past week.  Pt says wakes up in the middle of the night with pain and sob.  Reports pain is in center of chest.  Denies n/v.  Pt says during the day she can drink a sprite, burp, and pain goes away.

## 2013-04-10 NOTE — Progress Notes (Signed)
   Subjective:    Patient ID: Breanna Mason, female    DOB: Apr 30, 1946, 67 y.o.   MRN: [id number]  Chest Pain  This is a new problem. The current episode started in the past 7 days. The onset quality is sudden. The problem occurs constantly. The problem has been unchanged. The pain is present in the epigastric region. The pain is at a severity of 3/10. The pain is mild. The quality of the pain is described as pressure. The pain does not radiate.   Substernal discomfort woke up in the middle of the night  Felt sob, no sig nausea  Some discomfort at first of the week, this occur during the day  Felt it both day and nght. Drank some sprite. Seemed to ease off a bit.  Nausea or sicknessnone, and no sweatiness, no sob  Long-standing history of hypertension and hyperlipidemia. Significant history of smoke exposure.  Review of Systems  Cardiovascular: Positive for chest pain.   No abdominal pain no change in bowel habits no blood in stools ROS otherwise negative    Objective:   Physical Exam  Alert somewhat anxious appearing no acute distress. Vital stable. H&T normal. Lungs clear. Heart rare in rhythm. Abdomen benign. Ankles without edema.  EKG normal sinus rhythm no significant ST T changes      Assessment & Plan:  Impression chest discomfort with multiple r risk factors and some symptomatology suggestive of possible unstable angina feel further workup is warranted discussed with patient. Plan I spoke with ER Dr. Gaynell Face Penn for further tests and evaluation to rule out acute ischemic process. WSL

## 2013-04-15 ENCOUNTER — Encounter: Payer: Self-pay | Admitting: Family Medicine

## 2013-04-15 ENCOUNTER — Ambulatory Visit (INDEPENDENT_AMBULATORY_CARE_PROVIDER_SITE_OTHER): Payer: Medicare Other | Admitting: Family Medicine

## 2013-04-15 VITALS — BP 130/80 | Ht 63.0 in | Wt 148.0 lb

## 2013-04-15 DIAGNOSIS — R079 Chest pain, unspecified: Secondary | ICD-10-CM | POA: Diagnosis not present

## 2013-04-15 MED ORDER — CEPHALEXIN 500 MG PO CAPS: 500.0000 mg | ORAL_CAPSULE | Freq: Three times a day (TID) | ORAL | Status: DC

## 2013-04-15 MED ORDER — RANITIDINE HCL 300 MG PO TABS: 300.0000 mg | ORAL_TABLET | Freq: Every day | ORAL | Status: DC

## 2013-04-15 MED ORDER — ALBUTEROL SULFATE HFA 108 (90 BASE) MCG/ACT IN AERS: 2.0000 | INHALATION_SPRAY | Freq: Four times a day (QID) | RESPIRATORY_TRACT | Status: DC | PRN

## 2013-04-15 MED ORDER — ADVAIR DISKUS 250-50 MCG/DOSE IN AEPB: 1.0000 | INHALATION_SPRAY | Freq: Two times a day (BID) | RESPIRATORY_TRACT | Status: DC

## 2013-04-15 NOTE — Patient Instructions (Signed)
You will hear from brendale this week on the cardiology consult

## 2013-04-15 NOTE — Progress Notes (Signed)
   Subjective:    Patient ID: Breanna Mason, female    DOB: [birth year], 67 y.o.   MRN: [id number]  HPI Patient is here today to follow up on chest pain. Was seen at [facility] ER on last Friday and had a EKG, chest xray and blood work done. She would like to discuss these results.   Had a challenging time with the ER. She felt that she was not treated fairly. It made her stay in emergency room for 5 hours. Then decided she did not need a second set of enzymes. Full ER note reviewed. Cardiac enzymes were fortunately negative. EKG negative. Telemetry negative. Patient was started on omeprazole in hopes of helping some of her symptoms.  Patient thinks that she may have an element of bronchitis. Cough at times. Occasional productive of phlegm. No fever no chills  Woke up in the mid of the night with pressure again this weekend.. Substernal transient lasted a few minutes.    Review of Systems No nausea no diaphoresis no dyspnea no abdominal pain no change in bowel habits    Objective:   Physical Exam  Alert mildly anxious. HEENT normal vital stable. Lungs clear. Heart regular in rhythm. Abdomen benign.      Assessment & Plan:  Impression 1 chest pain doubt cardiac but enough features that we should see through a proper workup. Discussed at length with patient. Easily 25 minutes spent most in discussion. Plan add antibiotic. Cardiology referral. After one month on omeprazole switch to ranitidine. Followup in several months.

## 2013-04-17 ENCOUNTER — Ambulatory Visit: Payer: Medicare Other | Admitting: Cardiology

## 2013-04-18 ENCOUNTER — Encounter: Payer: Self-pay | Admitting: Family Medicine

## 2013-04-20 ENCOUNTER — Ambulatory Visit (INDEPENDENT_AMBULATORY_CARE_PROVIDER_SITE_OTHER): Payer: Medicare Other | Admitting: Adult Health

## 2013-04-20 ENCOUNTER — Encounter: Payer: Self-pay | Admitting: Adult Health

## 2013-04-20 ENCOUNTER — Encounter: Payer: Self-pay | Admitting: *Deleted

## 2013-04-20 VITALS — BP 114/57 | HR 86 | Ht 63.0 in | Wt 145.0 lb

## 2013-04-20 DIAGNOSIS — E782 Mixed hyperlipidemia: Secondary | ICD-10-CM | POA: Diagnosis not present

## 2013-04-20 DIAGNOSIS — I10 Essential (primary) hypertension: Secondary | ICD-10-CM | POA: Diagnosis not present

## 2013-04-20 DIAGNOSIS — R079 Chest pain, unspecified: Secondary | ICD-10-CM

## 2013-04-20 DIAGNOSIS — R0789 Other chest pain: Secondary | ICD-10-CM | POA: Insufficient documentation

## 2013-04-20 MED ORDER — BUDESONIDE-FORMOTEROL FUMARATE 160-4.5 MCG/ACT IN AERO: 1.0000 | INHALATION_SPRAY | Freq: Two times a day (BID) | RESPIRATORY_TRACT | Status: DC

## 2013-04-20 NOTE — Progress Notes (Signed)
HPI: Breanna Mason is a 67 year old patient of Dr. Diona Browner we are following for ongoing assessment and management of recurrent chest pain. She has a history of hypertension, GERD, has been lost to followup since 2012. Other history includes asthma, anxiety, and mixed hyperlipidemia. On last visit her EKG was normal, she had a stress Myoview which was completed 10 2012 that was normal without evidence of ischemia. Chest pain was found to be atypical for ACS. The patient was recently seen in the emergency room with recurrent chest pain. She is referred back to cardiology with her further evaluation.   Patient states one week ago the patient woke up with chest pain substernal nonradiating without associated shortness of breath or diaphoresis. She described it as tightness. The pain when away on its own by lunchtime but she remains sore thereafter. The patient states she usually feels it first thing in the morning and has been occurring almost everyday since original that one week ago. She states the pain is usually gone by lunchtime. This not occur with any exertion, anxiety, and is not associated with any issues with breathing. She was recently started on protonix 20 mg daily, and told to hold Zantac for one week. She was to return to Zantac after a 30 day supply of the protonix. She states she has not feeling any different with use of new medication.  Allergies  Allergen Reactions  . Penicillins Other (See Comments)    " Makes me sick"    Current Outpatient Prescriptions  Medication Sig Dispense Refill  . albuterol (PROVENTIL HFA;VENTOLIN HFA) 108 (90 BASE) MCG/ACT inhaler Inhale 2 puffs into the lungs every 6 (six) hours as needed for wheezing or shortness of breath.  1 Inhaler  2  . alendronate (FOSAMAX) 70 MG tablet TAKE ONE TABLET BY MOUTH ONCE A WEEK  14 tablet  3  . ALPRAZolam (XANAX) 0.5 MG tablet TAKE ONE TABLET BY MOUTH EVERY DAY AS NEEDED FOR ANXIETY  30 tablet  2  .  budesonide-formoterol (SYMBICORT) 160-4.5 MCG/ACT inhaler Inhale 1 puff into the lungs 2 (two) times daily.  1 Inhaler  5  . calcium-vitamin D (OSCAL WITH D) 500-200 MG-UNIT per tablet Take 1 tablet by mouth daily.        . cephALEXin (KEFLEX) 500 MG capsule Take 1 capsule (500 mg total) by mouth 3 (three) times daily. For 10 days  30 capsule  0  . enalapril (VASOTEC) 10 MG tablet TAKE ONE TABLET BY MOUTH EVERY DAY  90 tablet  1  . meclizine (ANTIVERT) 25 MG tablet Take 25 mg by mouth as needed.        . pravastatin (PRAVACHOL) 40 MG tablet Take 1 tablet (40 mg total) by mouth daily.  90 tablet  1  . ranitidine (ZANTAC) 300 MG tablet Take 1 tablet (300 mg total) by mouth at bedtime.  30 tablet  2   No current facility-administered medications for this visit.    Past Medical History  Diagnosis Date  . Essential hypertension, benign   . GERD (gastroesophageal reflux disease)   . Asthma   . Mixed hyperlipidemia   . Impaired glucose tolerance   . Osteoporosis   . Anxiety   . Osteopenia   . Reflux   . IFG (impaired fasting glucose)   . Allergy     Past Surgical History  Procedure Laterality Date  . Cesarean section    . Bilateral tubal ligation    . Shoulder surgery  ROS: Review of systems complete and found to be negative unless listed above  PHYSICAL EXAM BP 114/57  Pulse 86  Ht 5\' 3"  (1.6 m)  Wt 145 lb (65.772 kg)  BMI 25.69 kg/m2  General: Well developed, well nourished, in no acute distress Head: Eyes PERRLA, No xanthomas.   Normal cephalic and atramatic  Lungs: Clear bilaterally to auscultation and percussion. Heart: HRRR S1 S2, without MRG.  Pulses are 2+ & equal.            No carotid bruit. No JVD.  No abdominal bruits. No femoral bruits. Abdomen: Bowel sounds are positive, abdomen soft and non-tender without masses or                  Hernia's noted. Msk:  Back normal, normal gait. Normal strength and tone for age. Extremities: No clubbing, cyanosis or  edema.  DP +1 Neuro: Alert and oriented X 3. Psych:  Good affect, responds appropriately  :  ASSESSMENT AND PLAN

## 2013-04-20 NOTE — Assessment & Plan Note (Signed)
Followed by Dr. Gerda Diss on annual appointments. No blood work will be drawn.

## 2013-04-20 NOTE — Assessment & Plan Note (Signed)
Blood pressure is well-controlled currently we will not make any changes in her medication regimen at this time. She will continue ACE inhibitor.

## 2013-04-20 NOTE — Patient Instructions (Signed)
Your physician recommends that you schedule a follow-up appointment in: Post test with Dr Diona Browner  Your physician has requested that you have a stress echocardiogram. For further information please visit [email or web address] Please follow instruction sheet as given.

## 2013-04-20 NOTE — Progress Notes (Deleted)
Name: Breanna Mason    DOB: June 05, 1945  Age: 67 y.o.  MR#: [id number]       PCP:  Harlow Asa, MD      Insurance: Payor: MEDICARE / Plan: MEDICARE PART A AND B / Product Type: *No Product type* /   CC:    Chief Complaint  Patient presents with  . Chest Pain  . Hypertension    VS Filed Vitals:   04/20/13 1414  BP: 114/57  Pulse: 86  Height: 5\' 3"  (1.6 m)  Weight: 145 lb (65.772 kg)    Weights Current Weight  04/20/13 145 lb (65.772 kg)  04/15/13 148 lb (67.132 kg)  04/10/13 147 lb (66.679 kg)    Blood Pressure  BP Readings from Last 3 Encounters:  04/20/13 114/57  04/15/13 130/80  04/10/13 154/80     Admit date:  (Not on file) Last encounter with RMR:  Visit date not found   Allergy Penicillins  Current Outpatient Prescriptions  Medication Sig Dispense Refill  . albuterol (PROVENTIL HFA;VENTOLIN HFA) 108 (90 BASE) MCG/ACT inhaler Inhale 2 puffs into the lungs every 6 (six) hours as needed for wheezing or shortness of breath.  1 Inhaler  2  . alendronate (FOSAMAX) 70 MG tablet TAKE ONE TABLET BY MOUTH ONCE A WEEK  14 tablet  3  . ALPRAZolam (XANAX) 0.5 MG tablet TAKE ONE TABLET BY MOUTH EVERY DAY AS NEEDED FOR ANXIETY  30 tablet  2  . budesonide-formoterol (SYMBICORT) 160-4.5 MCG/ACT inhaler Inhale 1 puff into the lungs 2 (two) times daily.  1 Inhaler  5  . calcium-vitamin D (OSCAL WITH D) 500-200 MG-UNIT per tablet Take 1 tablet by mouth daily.        . cephALEXin (KEFLEX) 500 MG capsule Take 1 capsule (500 mg total) by mouth 3 (three) times daily. For 10 days  30 capsule  0  . enalapril (VASOTEC) 10 MG tablet TAKE ONE TABLET BY MOUTH EVERY DAY  90 tablet  1  . meclizine (ANTIVERT) 25 MG tablet Take 25 mg by mouth as needed.        . pravastatin (PRAVACHOL) 40 MG tablet Take 1 tablet (40 mg total) by mouth daily.  90 tablet  1  . ranitidine (ZANTAC) 300 MG tablet Take 1 tablet (300 mg total) by mouth at bedtime.  30 tablet  2   No current facility-administered  medications for this visit.    Discontinued Meds:    Medications Discontinued During This Encounter  Medication Reason  . omeprazole (PRILOSEC) 20 MG capsule Error    Patient Active Problem List   Diagnosis Date Noted  . Esophageal reflux 09/07/2012  . Exertional chest pain 03/06/2011  . Essential hypertension, benign 03/06/2011  . Mixed hyperlipidemia 03/06/2011  . Impaired glucose tolerance 03/06/2011  . Asthma 03/06/2011    LABS    Component Value Date/Time   NA 139 04/10/2013 1321   NA 141 02/17/2013 0735   NA 139 08/26/2012 0834   K 3.8 04/10/2013 1321   K 4.3 02/17/2013 0735   K 4.3 08/26/2012 0834   CL 104 04/10/2013 1321   CL 105 02/17/2013 0735   CL 107 08/26/2012 0834   CO2 25 04/10/2013 1321   CO2 28 02/17/2013 0735   CO2 25 08/26/2012 0834   GLUCOSE 98 04/10/2013 1321   GLUCOSE 100* 02/17/2013 0735   GLUCOSE 112* 08/26/2012 0834   BUN 14 04/10/2013 1321   BUN 17 02/17/2013 0735   BUN 15 08/26/2012 0834  CREATININE 0.71 04/10/2013 1321   CREATININE 0.59 02/17/2013 0735   CREATININE 0.67 08/26/2012 0834   CALCIUM 9.4 04/10/2013 1321   CALCIUM 8.8 02/17/2013 0735   CALCIUM 9.0 08/26/2012 0834   GFRNONAA 88* 04/10/2013 1321   GFRAA >90 04/10/2013 1321   CMP     Component Value Date/Time   NA 139 04/10/2013 1321   K 3.8 04/10/2013 1321   CL 104 04/10/2013 1321   CO2 25 04/10/2013 1321   GLUCOSE 98 04/10/2013 1321   BUN 14 04/10/2013 1321   CREATININE 0.71 04/10/2013 1321   CREATININE 0.59 02/17/2013 0735   CALCIUM 9.4 04/10/2013 1321   PROT 7.4 04/10/2013 1321   ALBUMIN 4.2 04/10/2013 1321   AST 22 04/10/2013 1321   ALT 29 04/10/2013 1321   ALKPHOS 57 04/10/2013 1321   BILITOT 0.2* 04/10/2013 1321   GFRNONAA 88* 04/10/2013 1321   GFRAA >90 04/10/2013 1321       Component Value Date/Time   WBC 5.7 04/10/2013 1321   HGB 14.3 04/10/2013 1321   HCT 41.7 04/10/2013 1321   MCV 91.2 04/10/2013 1321    Lipid Panel     Component Value Date/Time   CHOL 192 02/17/2013 0735    TRIG 87 02/17/2013 0735   HDL 52 02/17/2013 0735   CHOLHDL 3.7 02/17/2013 0735   VLDL 17 02/17/2013 0735   LDLCALC 123* 02/17/2013 0735    ABG No results found for this basename: phart, pco2, pco2art, po2, po2art, hco3, tco2, acidbasedef, o2sat     No results found for this basename: TSH   BNP (last 3 results) No results found for this basename: PROBNP,  in the last 8760 hours Cardiac Panel (last 3 results) No results found for this basename: CKTOTAL, CKMB, TROPONINI, RELINDX,  in the last 72 hours  Iron/TIBC/Ferritin No results found for this basename: iron, tibc, ferritin     EKG Orders placed during the hospital encounter of 04/10/13  . EKG 12-LEAD  . EKG 12-LEAD  . ED EKG  . ED EKG  . EKG     Prior Assessment and Plan Problem List as of 04/20/2013   Exertional chest pain   Last Assessment & Plan   03/06/2011 Office Visit Written 03/06/2011  9:09 AM by Jonelle Sidle, MD     Suggestive of angina based on description, and with cardiac risk factors including hypertension, hyperlipidemia, and impaired glucose tolerance. Framingham risk score is however low, and there is no definitive family history of premature CAD. Baseline ECG is normal. Further risk stratification is being pursued via exercise Myoview.    Essential hypertension, benign   Last Assessment & Plan   03/06/2011 Office Visit Written 03/06/2011  9:10 AM by Jonelle Sidle, MD     Blood pressure well controlled today.    Mixed hyperlipidemia   Last Assessment & Plan   03/06/2011 Office Visit Written 03/06/2011  9:11 AM by Jonelle Sidle, MD     On statin therapy, recent LDL 137. Ideally goal should be under 100.    Impaired glucose tolerance   Last Assessment & Plan   03/06/2011 Office Visit Written 03/06/2011  9:10 AM by Jonelle Sidle, MD     Followed by Dr.Luking. Hemoglobin A1c 6.0.    Asthma   Last Assessment & Plan   03/06/2011 Office Visit Written 03/06/2011  9:10 AM by Jonelle Sidle, MD     Could be that some of her symptoms are related to exercise-induced asthma.  Esophageal reflux       Imaging: Dg Chest 2 View  04/10/2013   CLINICAL DATA:  67 year old female chest pain, hypertension. Initial encounter.  EXAM: CHEST  2 VIEW  COMPARISON:  None.  FINDINGS: Lung volumes at the upper limits of normal. Normal cardiac size and mediastinal contours. Visualized tracheal air column is within normal limits. No pneumothorax, pulmonary edema, pleural effusion or confluent pulmonary opacity. Postoperative changes to the right shoulder and proximal right humerus. No acute osseous abnormality identified.  IMPRESSION: No acute cardiopulmonary abnormality.   Electronically Signed   By: Augusto Gamble M.D.   On: 04/10/2013 13:03

## 2013-04-20 NOTE — Assessment & Plan Note (Signed)
The pain appears atypical. Is not elicited by exercise, not associated with shortness of breath, diaphoresis, or dizziness. Occurs only in the morning after she awakens, he remains sore until about lunchtime. She has been treated with a PPI. Cardiovascular risk factors include hypertension age and family history. We will schedule her for a stress echocardiogram for further assessment for ischemia. I doubt that this is related.   Would consider sleep study for OSA, as this may be causing some soreness in the morning and the chest wall. We will await stress test results before referring her back to PCP for further testing for noncardiac etiology of chest pain.

## 2013-04-27 ENCOUNTER — Other Ambulatory Visit: Payer: Self-pay | Admitting: Family Medicine

## 2013-05-01 ENCOUNTER — Encounter: Payer: Self-pay | Admitting: Family Medicine

## 2013-05-05 ENCOUNTER — Ambulatory Visit (HOSPITAL_COMMUNITY)
Admission: RE | Admit: 2013-05-05 | Discharge: 2013-05-05 | Disposition: A | Discharge status: Home / Self Care | Payer: Medicare Other | Source: Ambulatory Visit | Attending: Adult Health | Admitting: Adult Health

## 2013-05-05 ENCOUNTER — Encounter (HOSPITAL_COMMUNITY): Payer: Self-pay

## 2013-05-05 DIAGNOSIS — E785 Hyperlipidemia, unspecified: Secondary | ICD-10-CM | POA: Diagnosis not present

## 2013-05-05 DIAGNOSIS — R072 Precordial pain: Secondary | ICD-10-CM | POA: Diagnosis not present

## 2013-05-05 DIAGNOSIS — I10 Essential (primary) hypertension: Secondary | ICD-10-CM | POA: Insufficient documentation

## 2013-05-05 DIAGNOSIS — R079 Chest pain, unspecified: Secondary | ICD-10-CM

## 2013-05-05 NOTE — Progress Notes (Signed)
Stress Lab Nurses Notes - Breanna Mason  Breanna Mason 05/05/2013 Reason for doing test: Chest Pain Type of test: Stress Echo Nurse performing test: Parke Poisson, RN Nuclear Medicine Tech: Not Applicable Echo Tech: Veda Canning MD performing test: Ival Bible / K.Lyman Bishop NP Family MD: Lubertha South Test explained and consent signed: yes IV started: No IV started Symptoms: None Treatment/Intervention: None Reason test stopped: reached target HR After recovery IV was: NA Patient to return to Nuc. Med at : NA Patient discharged: Home Patient's Condition upon discharge was: stable Comments:During test peak BP 153/89 & HR 164.  Recovery BP 124/77 & HR 93.  Symptoms resolved in recovery. [name]

## 2013-05-05 NOTE — Progress Notes (Signed)
*  PRELIMINARY RESULTS* Echocardiogram Echocardiogram Stress Test has been performed.  ,  05/05/2013, 10:55 AM

## 2013-05-08 ENCOUNTER — Ambulatory Visit: Payer: Medicare Other | Admitting: Cardiology

## 2013-05-19 ENCOUNTER — Ambulatory Visit (INDEPENDENT_AMBULATORY_CARE_PROVIDER_SITE_OTHER): Payer: Medicare Other | Admitting: Cardiology

## 2013-05-19 ENCOUNTER — Encounter: Payer: Self-pay | Admitting: Cardiology

## 2013-05-19 VITALS — BP 126/47 | HR 78 | Ht 63.0 in | Wt 146.0 lb

## 2013-05-19 DIAGNOSIS — I10 Essential (primary) hypertension: Secondary | ICD-10-CM | POA: Diagnosis not present

## 2013-05-19 DIAGNOSIS — R0789 Other chest pain: Secondary | ICD-10-CM | POA: Diagnosis not present

## 2013-05-19 NOTE — Assessment & Plan Note (Signed)
No change to current regimen. Keep followup with Dr. Luking. 

## 2013-05-19 NOTE — Progress Notes (Signed)
Clinical Summary Breanna Mason is a 69 y.o.female presenting for office followup. I saw her in consultation back in October 2012 and at that time she had a low risk exercise Myoview without evidence of ischemia, and has followed up with Dr. Wolfgang Phoenix since then. More recently she was referred back to the office and saw Ms. Lawrence NP in December 2014 with atypical chest pain.  She was referred for repeat ischemic testing and underwent an exercise echocardiogram in December 2014 that was low risk, no evidence of ischemia by ECG or echocardiographic criteria.  She reports symptoms that are suggestive of reflux. She has been trying Zantac and Nexium intermittently. Still has symptoms in the morning, gets up and has belching, seems to be somewhat better with medications.   Allergies  Allergen Reactions  . Penicillins Other (See Comments)    " Makes me sick"    Current Outpatient Prescriptions  Medication Sig Dispense Refill  . albuterol (PROVENTIL HFA;VENTOLIN HFA) 108 (90 BASE) MCG/ACT inhaler Inhale 2 puffs into the lungs every 6 (six) hours as needed for wheezing or shortness of breath.  1 Inhaler  2  . alendronate (FOSAMAX) 70 MG tablet TAKE ONE TABLET BY MOUTH ONCE A WEEK  14 tablet  3  . ALPRAZolam (XANAX) 0.5 MG tablet TAKE ONE TABLET BY MOUTH EVERY DAY AS NEEDED FOR ANXIETY  30 tablet  2  . budesonide-formoterol (SYMBICORT) 160-4.5 MCG/ACT inhaler Inhale 1 puff into the lungs 2 (two) times daily.  1 Inhaler  5  . calcium-vitamin D (OSCAL WITH D) 500-200 MG-UNIT per tablet Take 1 tablet by mouth daily.        . enalapril (VASOTEC) 10 MG tablet TAKE ONE TABLET BY MOUTH EVERY DAY  90 tablet  1  . meclizine (ANTIVERT) 25 MG tablet Take 25 mg by mouth as needed.        . pravastatin (PRAVACHOL) 40 MG tablet TAKE ONE TABLET BY MOUTH ONCE DAILY  90 tablet  1  . pravastatin (PRAVACHOL) 40 MG tablet Take 20 mg by mouth daily.      . ranitidine (ZANTAC) 300 MG tablet Take 1 tablet (300 mg  total) by mouth at bedtime.  30 tablet  2   No current facility-administered medications for this visit.    Past Medical History  Diagnosis Date  . Essential hypertension, benign   . GERD (gastroesophageal reflux disease)   . Asthma   . Mixed hyperlipidemia   . Impaired glucose tolerance   . Osteoporosis   . Anxiety   . Osteopenia   . Reflux   . IFG (impaired fasting glucose)   . Allergy     Social History Breanna Mason reports that she has never smoked. She has never used smokeless tobacco. Breanna Mason reports that she does not drink alcohol.  Review of Systems No palpitations, dizziness, syncope. No exertional chest pain. No unusual breathlessness. Otherwise negative.  Physical Examination Filed Vitals:   05/19/13 1113  BP: 126/47  Pulse: 78   Filed Weights   05/19/13 1113  Weight: 146 lb (66.225 kg)   Patient appears comfortable at rest. HEENT: Conjunctiva and lids normal, oropharynx clear. Neck: Supple, no elevated JVP or carotid bruits, no thyromegaly. Lungs: Clear to auscultation, nonlabored breathing at rest. Cardiac: Regular rate and rhythm, no S3 or significant systolic murmur, no pericardial rub. Extremities: No pitting edema, distal pulses 2+.   Problem List and Plan   Chest discomfort Possibly reflux based on description. Recent exercise echocardiogram  was normal, low risk for obstructive CAD and predicts low risk of cardiac events. No further cardiac workup at this time. Followup can be as needed. Keep regular visits with Dr. Wolfgang Phoenix.  Essential hypertension, benign No change to current regimen. Keep followup with Dr. Wolfgang Phoenix.    Satira Sark, M.D., F.A.C.C.

## 2013-05-19 NOTE — Patient Instructions (Addendum)
Your physician recommends that you schedule a follow-up appointment only as needed   Your physician recommends that you continue on your current medications as directed. Please refer to the Current Medication list given to you today.

## 2013-05-19 NOTE — Assessment & Plan Note (Addendum)
Possibly reflux based on description. Recent exercise echocardiogram was normal, low risk for obstructive CAD and predicts low risk of cardiac events. No further cardiac workup at this time. Followup can be as needed. Keep regular visits with Dr. Wolfgang Phoenix.

## 2013-05-22 ENCOUNTER — Other Ambulatory Visit: Payer: Self-pay | Admitting: Family Medicine

## 2013-05-28 ENCOUNTER — Ambulatory Visit (INDEPENDENT_AMBULATORY_CARE_PROVIDER_SITE_OTHER): Payer: Medicare Other | Admitting: Family Medicine

## 2013-05-28 ENCOUNTER — Encounter: Payer: Self-pay | Admitting: Family Medicine

## 2013-05-28 VITALS — BP 132/88 | Temp 97.9°F | Ht 63.0 in | Wt 146.0 lb

## 2013-05-28 DIAGNOSIS — K219 Gastro-esophageal reflux disease without esophagitis: Secondary | ICD-10-CM | POA: Diagnosis not present

## 2013-05-28 MED ORDER — PANTOPRAZOLE SODIUM 40 MG PO TBEC: 40.0000 mg | DELAYED_RELEASE_TABLET | Freq: Every day | ORAL | Status: DC

## 2013-05-28 NOTE — Progress Notes (Signed)
   Subjective:    Patient ID: Breanna Mason, female    DOB: Oct 06, 1945, 68 y.o.   MRN: [id number]  Gastrophageal Reflux She complains of belching. This is a new problem. The current episode started more than 1 month ago. Treatments tried: ranitidine 300mg     Pt went to ED 04/10/13 for chest pain. Saw Dr. Domenic Polite on 05/19/13.  Was on nexium/ranitidien  ne for chest discomfort--did okay Patient notes often worse after meals. Some substernal pressure. Some slight nausea. No diaphoresis shortness of breath or severe pain  Review of Systems No vomiting or diarrhea see above ROS otherwise negative    Objective:   Physical Exam  Alert slight malaise. HEENT normal. Lungs clear. Heart rare in rhythm. Abdomen benign. Chest wall nontender.      Assessment & Plan:  Impression probable reflux discussed plan add protonic since daily. Of note cardiac workup negative.. Symptomatic care discussed. WSL

## 2013-06-14 ENCOUNTER — Encounter: Payer: Self-pay | Admitting: Family Medicine

## 2013-06-15 MED ORDER — SUCRALFATE 1 G PO TABS: ORAL_TABLET | ORAL | Status: DC

## 2013-07-02 ENCOUNTER — Encounter: Payer: Self-pay | Admitting: Family Medicine

## 2013-07-03 ENCOUNTER — Other Ambulatory Visit: Payer: Self-pay | Admitting: *Deleted

## 2013-07-03 MED ORDER — MECLIZINE HCL 25 MG PO TABS: 25.0000 mg | ORAL_TABLET | ORAL | Status: DC | PRN

## 2013-07-14 ENCOUNTER — Ambulatory Visit: Payer: Medicare Other | Admitting: Family Medicine

## 2013-08-04 ENCOUNTER — Telehealth: Payer: Self-pay | Admitting: Family Medicine

## 2013-08-04 NOTE — Telephone Encounter (Signed)
Phone number to dr. Olevia Perches office given to patient

## 2013-08-04 NOTE — Telephone Encounter (Signed)
Pt is needing a colonoscopy and wants to know if we can set this up for her?  She saw Dr Laural Golden last time if we could use him again.   Question give pt a call

## 2013-08-04 NOTE — Telephone Encounter (Signed)
Nurse cll give her [facility] number, families now sched reg screeningcolonoscopies directly

## 2013-08-05 ENCOUNTER — Other Ambulatory Visit (INDEPENDENT_AMBULATORY_CARE_PROVIDER_SITE_OTHER): Payer: Self-pay | Admitting: *Deleted

## 2013-08-05 ENCOUNTER — Telehealth (INDEPENDENT_AMBULATORY_CARE_PROVIDER_SITE_OTHER): Payer: Self-pay | Admitting: *Deleted

## 2013-08-05 DIAGNOSIS — Z86010 Personal history of colonic polyps: Secondary | ICD-10-CM

## 2013-08-05 DIAGNOSIS — Z1211 Encounter for screening for malignant neoplasm of colon: Secondary | ICD-10-CM

## 2013-08-05 MED ORDER — PEG-KCL-NACL-NASULF-NA ASC-C 100 G PO SOLR: 1.0000 | Freq: Once | ORAL | Status: DC

## 2013-08-05 NOTE — Telephone Encounter (Signed)
Patient needs movi prep 

## 2013-08-14 ENCOUNTER — Telehealth: Payer: Self-pay | Admitting: Family Medicine

## 2013-08-14 DIAGNOSIS — Z79899 Other long term (current) drug therapy: Secondary | ICD-10-CM

## 2013-08-14 DIAGNOSIS — E782 Mixed hyperlipidemia: Secondary | ICD-10-CM

## 2013-08-14 NOTE — Telephone Encounter (Signed)
Pt calling to get BW orders, call when sent

## 2013-08-16 NOTE — Telephone Encounter (Signed)
Lip liv glu 

## 2013-08-17 NOTE — Telephone Encounter (Signed)
Blood work orders placed in Epic. Patient notified. 

## 2013-08-24 ENCOUNTER — Telehealth (INDEPENDENT_AMBULATORY_CARE_PROVIDER_SITE_OTHER): Payer: Self-pay | Admitting: *Deleted

## 2013-08-24 DIAGNOSIS — E782 Mixed hyperlipidemia: Secondary | ICD-10-CM | POA: Diagnosis not present

## 2013-08-24 DIAGNOSIS — Z79899 Other long term (current) drug therapy: Secondary | ICD-10-CM | POA: Diagnosis not present

## 2013-08-24 NOTE — Telephone Encounter (Signed)
agree

## 2013-08-24 NOTE — Telephone Encounter (Signed)
  Procedure: tcs  Reason/Indication:  Hx polyps  Has patient had this procedure before?  Yes, 2005 -- epic  If so, when, by whom and where?    Is there a family history of colon cancer?  no  Who?  What age when diagnosed?    Is patient diabetic?   no      Does patient have prosthetic heart valve?  no  Do you have a pacemaker?  no  Has patient ever had endocarditis? no  Has patient had joint replacement within last 12 months?  no  Does patient tend to be constipated or take laxatives? no  Is patient on Coumadin, Plavix and/or Aspirin? no  Medications: pravastatin 20 mg daily, enalapril 10 mg daily, alprazolam 0.5 mg prn, alendronate 70 mg 1 tab weekly, oscal 500 mg daily  Allergies: pcn  Medication Adjustment:   Procedure date & time: 09/17/13

## 2013-08-25 LAB — LIPID PANEL
Cholesterol: 206 mg/dL — ABNORMAL HIGH (ref 0–200)
HDL: 53 mg/dL (ref >39)
LDL Cholesterol: 134 mg/dL — ABNORMAL HIGH (ref 0–99)
Total CHOL/HDL Ratio: 3.9 Ratio
Triglycerides: 95 mg/dL (ref <150)
VLDL: 19 mg/dL (ref 0–40)

## 2013-08-25 LAB — HEPATIC FUNCTION PANEL
ALT: 21 U/L (ref 0–35)
AST: 17 U/L (ref 0–37)
Albumin: 4.4 g/dL (ref 3.5–5.2)
Alkaline Phosphatase: 41 U/L (ref 39–117)
Bilirubin, Direct: 0.1 mg/dL (ref 0.0–0.3)
Indirect Bilirubin: 0.4 mg/dL (ref 0.2–1.2)
Total Bilirubin: 0.5 mg/dL (ref 0.2–1.2)
Total Protein: 6.6 g/dL (ref 6.0–8.3)

## 2013-08-25 LAB — GLUCOSE, RANDOM: Glucose, Bld: 110 mg/dL — ABNORMAL HIGH (ref 70–99)

## 2013-09-04 ENCOUNTER — Encounter (HOSPITAL_COMMUNITY): Payer: Self-pay | Admitting: Pharmacy Technician

## 2013-09-04 ENCOUNTER — Ambulatory Visit (INDEPENDENT_AMBULATORY_CARE_PROVIDER_SITE_OTHER): Payer: Medicare Other | Admitting: Family Medicine

## 2013-09-04 ENCOUNTER — Encounter: Payer: Self-pay | Admitting: Family Medicine

## 2013-09-04 VITALS — BP 112/80 | Ht 63.0 in | Wt 150.0 lb

## 2013-09-04 DIAGNOSIS — R7309 Other abnormal glucose: Secondary | ICD-10-CM | POA: Diagnosis not present

## 2013-09-04 DIAGNOSIS — Z23 Encounter for immunization: Secondary | ICD-10-CM

## 2013-09-04 DIAGNOSIS — E785 Hyperlipidemia, unspecified: Secondary | ICD-10-CM | POA: Diagnosis not present

## 2013-09-04 MED ORDER — ENALAPRIL MALEATE 10 MG PO TABS: ORAL_TABLET | ORAL | Status: DC

## 2013-09-04 MED ORDER — ALPRAZOLAM 0.5 MG PO TABS: ORAL_TABLET | ORAL | Status: DC

## 2013-09-04 MED ORDER — PRAVASTATIN SODIUM 40 MG PO TABS: ORAL_TABLET | ORAL | Status: DC

## 2013-09-04 NOTE — Progress Notes (Signed)
   Subjective:    Patient ID: Breanna Mason, female    DOB: [birth year], 68 y.o.   MRN: [id number]  Hypertension This is a chronic problem. The current episode started more than 1 year ago. The problem has been gradually improving since onset. The problem is controlled. There are no associated agents to hypertension. There are no known risk factors for coronary artery disease. Treatments tried: enalapril. The current treatment provides significant improvement. There are no compliance problems.   Patient wants to discuss a tetanus and pneumonia vaccine. No other concerns at this time.  History of glucose intolerance. Trying to watch her sugar in her diet. Positive family history diabetes.  Compliant with medicine for her cholesterol. No obvious side effects. Mostly watch her diet.  Compliant with blood pressure medications. No obvious side effects. Watch her diet. Not exercising as much as she would hope.  Exercises off and on  Sticking with med    Results for orders placed in visit on 08/14/13  LIPID PANEL      Result Value Ref Range   Cholesterol 206 (*) 0 - 200 mg/dL   Triglycerides 95  <150 mg/dL   HDL 53  >39 mg/dL   Total CHOL/HDL Ratio 3.9     VLDL 19  0 - 40 mg/dL   LDL Cholesterol 134 (*) 0 - 99 mg/dL  HEPATIC FUNCTION PANEL      Result Value Ref Range   Total Bilirubin 0.5  0.2 - 1.2 mg/dL   Bilirubin, Direct 0.1  0.0 - 0.3 mg/dL   Indirect Bilirubin 0.4  0.2 - 1.2 mg/dL   Alkaline Phosphatase 41  39 - 117 U/L   AST 17  0 - 37 U/L   ALT 21  0 - 35 U/L   Total Protein 6.6  6.0 - 8.3 g/dL   Albumin 4.4  3.5 - 5.2 g/dL  GLUCOSE, RANDOM      Result Value Ref Range   Glucose, Bld 110 (*) 70 - 99 mg/dL   Tries not to miss the meds  Needs refills on prav zanax and bp med  Review of Systems No headache no chest pain no back pain no abdominal pain no change in bowel habits no blood in stool.    Objective:   Physical Exam Alert and no major distress. HEENT normal.  Vital stable. Blood pressure good on repeat. Lungs clear. Heart regular in rhythm. Ankles without edema.       Assessment & Plan:  Impression 1 hyperlipidemia good control blood work reviewed. #2 hypertension good control. #3 intermittent insomnia discussed with #4 glucose intolerance stable discussed plan exercise encourage. Maintain same meds. Check in 6 months. Tetanus shot. And pneumonia shot. WSL

## 2013-09-17 ENCOUNTER — Ambulatory Visit (HOSPITAL_COMMUNITY)
Admission: RE | Admit: 2013-09-17 | Discharge: 2013-09-17 | Disposition: A | Discharge status: Home / Self Care | Payer: Medicare Other | Source: Ambulatory Visit | Attending: Internal Medicine | Admitting: Internal Medicine

## 2013-09-17 ENCOUNTER — Encounter (HOSPITAL_COMMUNITY): Payer: Self-pay | Admitting: *Deleted

## 2013-09-17 ENCOUNTER — Encounter (HOSPITAL_COMMUNITY)
Admission: RE | Disposition: A | Discharge status: Home / Self Care | Payer: Self-pay | Source: Ambulatory Visit | Attending: Internal Medicine

## 2013-09-17 DIAGNOSIS — K644 Residual hemorrhoidal skin tags: Secondary | ICD-10-CM | POA: Insufficient documentation

## 2013-09-17 DIAGNOSIS — E782 Mixed hyperlipidemia: Secondary | ICD-10-CM | POA: Insufficient documentation

## 2013-09-17 DIAGNOSIS — Z79899 Other long term (current) drug therapy: Secondary | ICD-10-CM | POA: Diagnosis not present

## 2013-09-17 DIAGNOSIS — M810 Age-related osteoporosis without current pathological fracture: Secondary | ICD-10-CM | POA: Diagnosis not present

## 2013-09-17 DIAGNOSIS — K5730 Diverticulosis of large intestine without perforation or abscess without bleeding: Secondary | ICD-10-CM | POA: Diagnosis not present

## 2013-09-17 DIAGNOSIS — Z1211 Encounter for screening for malignant neoplasm of colon: Secondary | ICD-10-CM | POA: Insufficient documentation

## 2013-09-17 DIAGNOSIS — K219 Gastro-esophageal reflux disease without esophagitis: Secondary | ICD-10-CM | POA: Diagnosis not present

## 2013-09-17 DIAGNOSIS — Z86010 Personal history of colonic polyps: Secondary | ICD-10-CM

## 2013-09-17 DIAGNOSIS — D126 Benign neoplasm of colon, unspecified: Secondary | ICD-10-CM | POA: Insufficient documentation

## 2013-09-17 DIAGNOSIS — J45909 Unspecified asthma, uncomplicated: Secondary | ICD-10-CM | POA: Diagnosis not present

## 2013-09-17 DIAGNOSIS — Z7983 Long term (current) use of bisphosphonates: Secondary | ICD-10-CM | POA: Insufficient documentation

## 2013-09-17 DIAGNOSIS — F411 Generalized anxiety disorder: Secondary | ICD-10-CM | POA: Insufficient documentation

## 2013-09-17 DIAGNOSIS — I10 Essential (primary) hypertension: Secondary | ICD-10-CM | POA: Diagnosis not present

## 2013-09-17 HISTORY — PX: COLONOSCOPY: SHX5424

## 2013-09-17 SURGERY — COLONOSCOPY
Anesthesia: Moderate Sedation

## 2013-09-17 MED ORDER — MEPERIDINE HCL 50 MG/ML IJ SOLN
INTRAMUSCULAR | Status: DC | PRN
  Administered 2013-09-17 (×2): 25 mg via INTRAVENOUS

## 2013-09-17 MED ORDER — MIDAZOLAM HCL 5 MG/5ML IJ SOLN
INTRAMUSCULAR | Status: AC
  Filled 2013-09-17: qty 10

## 2013-09-17 MED ORDER — MIDAZOLAM HCL 5 MG/5ML IJ SOLN
INTRAMUSCULAR | Status: DC | PRN
  Administered 2013-09-17 (×2): 1 mg via INTRAVENOUS
  Administered 2013-09-17: 2 mg via INTRAVENOUS
  Administered 2013-09-17: 1 mg via INTRAVENOUS
  Administered 2013-09-17 (×2): 2 mg via INTRAVENOUS

## 2013-09-17 MED ORDER — STERILE WATER FOR IRRIGATION IR SOLN
Status: DC | PRN
  Administered 2013-09-17: 12:00:00

## 2013-09-17 MED ORDER — MEPERIDINE HCL 50 MG/ML IJ SOLN
INTRAMUSCULAR | Status: AC
  Filled 2013-09-17: qty 1

## 2013-09-17 MED ORDER — SODIUM CHLORIDE 0.9 % IV SOLN
INTRAVENOUS | Status: DC
  Administered 2013-09-17: 11:00:00 via INTRAVENOUS

## 2013-09-17 NOTE — Op Note (Signed)
COLONOSCOPY PROCEDURE REPORT  PATIENT:  Breanna Mason  MR#:  [id number] Birthdate:  01-07-1946, 68 y.o., female Endoscopist:  Dr. Rogene Houston, MD Referred By:  Dr. Grace Bushy. Wolfgang Phoenix, MD  Procedure Date: 09/17/2013  Procedure:   Colonoscopy  Indications:  Patient is 68 year old Caucasian female who is here for screening colonoscopy. Her last exam was 10 years ago with removal of single small polyp which possibly was hypoplastic.  Informed Consent:  The procedure and risks were reviewed with the patient and informed consent was obtained.  Medications:  Demerol 50 mg IV Versed 9 mg IV  Description of procedure:  After a digital rectal exam was performed, that colonoscope was advanced from the anus through the rectum and colon to the area of the cecum, ileocecal valve and appendiceal orifice. The cecum was deeply intubated. These structures were well-seen and photographed for the record. From the level of the cecum and ileocecal valve, the scope was slowly and cautiously withdrawn. The mucosal surfaces were carefully surveyed utilizing scope tip to flexion to facilitate fold flattening as needed. The scope was pulled down into the rectum where a thorough exam including retroflexion was performed.  Findings:   Prep excellent. 5 mm polyp cold snare from splenic flexure other 5 mm polyp cold snare from sigmoid colon. These polyps were submitted together. Threel diverticula in sigmoid colon. Normal rectal mucosa. Small hemorrhoids below the dentate line.   Therapeutic/Diagnostic Maneuvers Performed:  See above   Complications:  None  Cecal Withdrawal Time:  8 minutes  Impression:  Examination performed to cecum. Two 5 mm polyps were cold snared and submitted together(splenic flexure and sigmoid colon). Three diverticula at sigmoid colon. External hemorrhoids.  Recommendations:  Standard instructions given. I will contact patient with biopsy results and further  recommendations.  Rogene Houston  09/17/2013 12:53 PM  CC: Dr. Rubbie Battiest, MD & Dr. Rayne Du ref. provider found

## 2013-09-17 NOTE — H&P (Signed)
Breanna Mason is an 68 y.o. female.   Chief Complaint: Patient is here for colonoscopy. HPI: Patient is 68 year old Caucasian female who is here for screening colonoscopy. Her last colonoscopy was 10 years ago when she had single small polyp removed. She was advised to wait 10 years before her next exam. She denies abdominal pain change in bowel habits or rectal bleeding. Family history is negative for CRC.  Past Medical History  Diagnosis Date  . Essential hypertension, benign   . GERD (gastroesophageal reflux disease)   . Asthma   . Mixed hyperlipidemia   . Impaired glucose tolerance   . Osteoporosis   . Anxiety   . Osteopenia   . Reflux   . IFG (impaired fasting glucose)   . Allergy     Past Surgical History  Procedure Laterality Date  . Cesarean section    . Bilateral tubal ligation    . Shoulder surgery      Family History  Problem Relation Age of Onset  . Hypertension Father   . Diabetes type II Father    Social History:  reports that she has never smoked. She has never used smokeless tobacco. She reports that she does not drink alcohol or use illicit drugs.  Allergies:  Allergies  Allergen Reactions  . Penicillins Other (See Comments)    " Makes me sick"    Medications Prior to Admission  Medication Sig Dispense Refill  . albuterol (PROVENTIL HFA;VENTOLIN HFA) 108 (90 BASE) MCG/ACT inhaler Inhale 2 puffs into the lungs every 6 (six) hours as needed for wheezing or shortness of breath.  1 Inhaler  2  . alendronate (FOSAMAX) 70 MG tablet TAKE ONE TABLET BY MOUTh on Saturdays      . ALPRAZolam (XANAX) 0.5 MG tablet TAKE ONE TABLET BY MOUTH EVERY DAY AS NEEDED FOR ANXIETY  30 tablet  5  . budesonide-formoterol (SYMBICORT) 160-4.5 MCG/ACT inhaler Inhale 1 puff into the lungs 2 (two) times daily.  1 Inhaler  5  . calcium-vitamin D (OSCAL WITH D) 500-200 MG-UNIT per tablet Take 1 tablet by mouth daily.        . enalapril (VASOTEC) 10 MG tablet Take 10 mg by mouth  at bedtime. TAKE ONE TABLET BY MOUTH EVERY DAY      . meclizine (ANTIVERT) 25 MG tablet Take 1 tablet (25 mg total) by mouth as needed.  30 tablet  0  . pantoprazole (PROTONIX) 40 MG tablet Take 1 tablet (40 mg total) by mouth daily.  30 tablet  6  . pravastatin (PRAVACHOL) 40 MG tablet Take 40 mg by mouth at bedtime.        No results found for this or any previous visit (from the past 48 hour(s)). No results found.  ROS  Blood pressure 121/75, pulse 92, temperature 98.7 F (37.1 C), temperature source Oral, resp. rate 18, weight 145 lb (65.772 kg), SpO2 95.00%. Physical Exam  Constitutional: She appears well-developed and well-nourished.  HENT:  Mouth/Throat: Oropharynx is clear and moist.  Eyes: Conjunctivae are normal.  Neck: No thyromegaly present.  Cardiovascular: Normal rate, regular rhythm and normal heart sounds.   No murmur heard. Respiratory: Effort normal and breath sounds normal.  GI: Soft. She exhibits no distension and no mass. There is no tenderness.  Musculoskeletal: She exhibits no edema.  Lymphadenopathy:    She has no cervical adenopathy.  Neurological: She is alert.  Skin: Skin is warm.     Assessment/Plan Average risk screening colonoscopy. (Patient had  single small polyp removed 10 years possibly hyperplastic)  Najeeb U Rehman 09/17/2013, 12:03 PM

## 2013-09-17 NOTE — Discharge Instructions (Signed)
Resume usual medications and diet. °No driving for 24 hours. °Physician will call with biopsy results. ° °Colonoscopy, Care After °Refer to this sheet in the next few weeks. These instructions provide you with information on caring for yourself after your procedure. Your health care provider may also give you more specific instructions. Your treatment has been planned according to current medical practices, but problems sometimes occur. Call your health care provider if you have any problems or questions after your procedure. °WHAT TO EXPECT AFTER THE PROCEDURE  °After your procedure, it is typical to have the following: °· A small amount of blood in your stool. °· Moderate amounts of gas and mild abdominal cramping or bloating. °HOME CARE INSTRUCTIONS °· Do not drive, operate machinery, or sign important documents for 24 hours. °· You may shower and resume your regular physical activities, but move at a slower pace for the first 24 hours. °· Take frequent rest periods for the first 24 hours. °· Walk around or put a warm pack on your abdomen to help reduce abdominal cramping and bloating. °· Drink enough fluids to keep your urine clear or pale yellow. °· You may resume your normal diet as instructed by your health care provider. Avoid heavy or fried foods that are hard to digest. °· Avoid drinking alcohol for 24 hours or as instructed by your health care provider. °· Only take over-the-counter or prescription medicines as directed by your health care provider. °· If a tissue sample (biopsy) was taken during your procedure: °· Do not take aspirin or blood thinners for 7 days, or as instructed by your health care provider. °· Do not drink alcohol for 7 days, or as instructed by your health care provider. °· Eat soft foods for the first 24 hours. °SEEK MEDICAL CARE IF: °You have persistent spotting of blood in your stool 2 3 days after the procedure. °SEEK IMMEDIATE MEDICAL CARE IF: °· You have more than a small  spotting of blood in your stool. °· You pass large blood clots in your stool. °· Your abdomen is swollen (distended). °· You have nausea or vomiting. °· You have a fever. °· You have increasing abdominal pain that is not relieved with medicine. °Document Released: 12/06/2003 Document Revised: 02/11/2013 Document Reviewed: 12/29/2012 °Exit[employer]® Patient Information ©2014 [employer]. ° °

## 2013-09-18 ENCOUNTER — Encounter (HOSPITAL_COMMUNITY): Payer: Self-pay | Admitting: Internal Medicine

## 2013-12-25 ENCOUNTER — Other Ambulatory Visit: Payer: Self-pay | Admitting: Obstetrics and Gynecology

## 2013-12-25 DIAGNOSIS — Z1231 Encounter for screening mammogram for malignant neoplasm of breast: Secondary | ICD-10-CM

## 2014-01-02 ENCOUNTER — Other Ambulatory Visit: Payer: Self-pay | Admitting: Obstetrics and Gynecology

## 2014-01-18 ENCOUNTER — Ambulatory Visit (HOSPITAL_COMMUNITY)
Admission: RE | Admit: 2014-01-18 | Discharge: 2014-01-18 | Disposition: A | Discharge status: Home / Self Care | Payer: Medicare Other | Source: Ambulatory Visit | Attending: Obstetrics and Gynecology | Admitting: Obstetrics and Gynecology

## 2014-01-18 DIAGNOSIS — Z1231 Encounter for screening mammogram for malignant neoplasm of breast: Secondary | ICD-10-CM | POA: Insufficient documentation

## 2014-01-28 ENCOUNTER — Telehealth: Payer: Self-pay | Admitting: Family Medicine

## 2014-01-28 DIAGNOSIS — E785 Hyperlipidemia, unspecified: Secondary | ICD-10-CM

## 2014-01-28 DIAGNOSIS — R7301 Impaired fasting glucose: Secondary | ICD-10-CM

## 2014-01-28 DIAGNOSIS — Z79899 Other long term (current) drug therapy: Secondary | ICD-10-CM

## 2014-01-28 NOTE — Telephone Encounter (Signed)
bloodwork orders ready. Pt notified.  

## 2014-01-28 NOTE — Telephone Encounter (Signed)
Patient needs order for blood work. °

## 2014-01-28 NOTE — Telephone Encounter (Signed)
Lip liv m7 

## 2014-01-28 NOTE — Telephone Encounter (Signed)
08/24/13 lipid, liver, glucose

## 2014-02-10 DIAGNOSIS — Z79899 Other long term (current) drug therapy: Secondary | ICD-10-CM | POA: Diagnosis not present

## 2014-02-10 DIAGNOSIS — E785 Hyperlipidemia, unspecified: Secondary | ICD-10-CM | POA: Diagnosis not present

## 2014-02-10 DIAGNOSIS — R7301 Impaired fasting glucose: Secondary | ICD-10-CM | POA: Diagnosis not present

## 2014-02-10 LAB — BASIC METABOLIC PANEL
BUN: 19 mg/dL (ref 6–23)
CHLORIDE: 106 meq/L (ref 96–112)
CO2: 23 meq/L (ref 19–32)
Calcium: 9 mg/dL (ref 8.4–10.5)
Creat: 0.66 mg/dL (ref 0.50–1.10)
Glucose, Bld: 112 mg/dL — ABNORMAL HIGH (ref 70–99)
Potassium: 4.3 mEq/L (ref 3.5–5.3)
Sodium: 139 mEq/L (ref 135–145)

## 2014-02-10 LAB — LIPID PANEL
Cholesterol: 189 mg/dL (ref 0–200)
HDL: 53 mg/dL (ref >39)
LDL Cholesterol: 111 mg/dL — ABNORMAL HIGH (ref 0–99)
Total CHOL/HDL Ratio: 3.6 Ratio
Triglycerides: 126 mg/dL (ref <150)
VLDL: 25 mg/dL (ref 0–40)

## 2014-02-10 LAB — HEPATIC FUNCTION PANEL
ALBUMIN: 4.3 g/dL (ref 3.5–5.2)
ALT: 28 U/L (ref 0–35)
AST: 21 U/L (ref 0–37)
Alkaline Phosphatase: 40 U/L (ref 39–117)
BILIRUBIN TOTAL: 0.5 mg/dL (ref 0.2–1.2)
Bilirubin, Direct: 0.1 mg/dL (ref 0.0–0.3)
Indirect Bilirubin: 0.4 mg/dL (ref 0.2–1.2)
TOTAL PROTEIN: 7.1 g/dL (ref 6.0–8.3)

## 2014-02-23 ENCOUNTER — Encounter: Payer: Self-pay | Admitting: Family Medicine

## 2014-02-23 ENCOUNTER — Ambulatory Visit (INDEPENDENT_AMBULATORY_CARE_PROVIDER_SITE_OTHER): Payer: Medicare Other | Admitting: Family Medicine

## 2014-02-23 VITALS — BP 114/80 | Ht 63.0 in | Wt 149.0 lb

## 2014-02-23 DIAGNOSIS — E782 Mixed hyperlipidemia: Secondary | ICD-10-CM

## 2014-02-23 DIAGNOSIS — K219 Gastro-esophageal reflux disease without esophagitis: Secondary | ICD-10-CM

## 2014-02-23 DIAGNOSIS — Z23 Encounter for immunization: Secondary | ICD-10-CM

## 2014-02-23 DIAGNOSIS — J4520 Mild intermittent asthma, uncomplicated: Secondary | ICD-10-CM | POA: Diagnosis not present

## 2014-02-23 DIAGNOSIS — I10 Essential (primary) hypertension: Secondary | ICD-10-CM

## 2014-02-23 MED ORDER — PRAVASTATIN SODIUM 40 MG PO TABS: 40.0000 mg | ORAL_TABLET | Freq: Every day | ORAL | Status: DC

## 2014-02-23 MED ORDER — BUDESONIDE-FORMOTEROL FUMARATE 160-4.5 MCG/ACT IN AERO: 1.0000 | INHALATION_SPRAY | Freq: Two times a day (BID) | RESPIRATORY_TRACT | Status: DC

## 2014-02-23 MED ORDER — ALPRAZOLAM 0.5 MG PO TABS: ORAL_TABLET | ORAL | Status: DC

## 2014-02-23 MED ORDER — ENALAPRIL MALEATE 10 MG PO TABS: 10.0000 mg | ORAL_TABLET | Freq: Every day | ORAL | Status: DC

## 2014-02-23 MED ORDER — ALENDRONATE SODIUM 70 MG PO TABS: ORAL_TABLET | ORAL | Status: DC

## 2014-02-23 MED ORDER — PANTOPRAZOLE SODIUM 40 MG PO TBEC: 40.0000 mg | DELAYED_RELEASE_TABLET | Freq: Every day | ORAL | Status: DC

## 2014-02-23 NOTE — Progress Notes (Signed)
   Subjective:    Patient ID: Breanna Mason, female    DOB: 1945/06/12, 68 y.o.   MRN: [id number]  Hyperlipidemia This is a new problem. The current episode started more than 1 year ago. There are no compliance problems.   flu vaccine today.   Results for orders placed in visit on 01/28/14  LIPID PANEL      Result Value Ref Range   Cholesterol 189  0 - 200 mg/dL   Triglycerides 126  <150 mg/dL   HDL 53  >39 mg/dL   Total CHOL/HDL Ratio 3.6     VLDL 25  0 - 40 mg/dL   LDL Cholesterol 111 (*) 0 - 99 mg/dL  HEPATIC FUNCTION PANEL      Result Value Ref Range   Total Bilirubin 0.5  0.2 - 1.2 mg/dL   Bilirubin, Direct 0.1  0.0 - 0.3 mg/dL   Indirect Bilirubin 0.4  0.2 - 1.2 mg/dL   Alkaline Phosphatase 40  39 - 117 U/L   AST 21  0 - 37 U/L   ALT 28  0 - 35 U/L   Total Protein 7.1  6.0 - 8.3 g/dL   Albumin 4.3  3.5 - 5.2 g/dL  BASIC METABOLIC PANEL      Result Value Ref Range   Sodium 139  135 - 145 mEq/L   Potassium 4.3  3.5 - 5.3 mEq/L   Chloride 106  96 - 112 mEq/L   CO2 23  19 - 32 mEq/L   Glucose, Bld 112 (*) 70 - 99 mg/dL   BUN 19  6 - 23 mg/dL   Creat 0.66  0.50 - 1.10 mg/dL   Calcium 9.0  8.4 - 10.5 mg/dL   Patient states her asthma is stable. Rare use of albuterol. General in no wheezing at all.  Compliant with her lipid medications. No obvious side effects.  Compliant with blood pressure medication. Watching salt intake. Walking some. No obvious side effects   Review of Systems No headache no chest pain and back pain abdominal pain no change in bowel habits    Objective:   Physical Exam Alert no apparent distress HEENT normal neck supple. Lungs clear heart regular rhythm cassette edema       Assessment & Plan:  Impression 1 hypertension good control #2 hyperlipidemia results back good control discussed #3 impaired fasting glucose ongoing #4 asthma clinically stable plan diet exercise discussed. Maintain same medications. Flu vaccine. WS

## 2014-03-02 DIAGNOSIS — M12511 Traumatic arthropathy, right shoulder: Secondary | ICD-10-CM | POA: Diagnosis not present

## 2014-03-05 ENCOUNTER — Encounter (HOSPITAL_COMMUNITY): Payer: Self-pay | Admitting: Pharmacy Technician

## 2014-03-09 ENCOUNTER — Encounter (HOSPITAL_COMMUNITY): Payer: Self-pay

## 2014-03-09 ENCOUNTER — Encounter (HOSPITAL_COMMUNITY)
Admission: RE | Admit: 2014-03-09 | Discharge: 2014-03-09 | Disposition: A | Discharge status: Home / Self Care | Payer: Medicare Other | Source: Ambulatory Visit | Attending: Orthopedic Surgery | Admitting: Orthopedic Surgery

## 2014-03-09 DIAGNOSIS — M12811 Other specific arthropathies, not elsewhere classified, right shoulder: Secondary | ICD-10-CM | POA: Diagnosis not present

## 2014-03-09 DIAGNOSIS — G8918 Other acute postprocedural pain: Secondary | ICD-10-CM | POA: Diagnosis not present

## 2014-03-09 DIAGNOSIS — Z01812 Encounter for preprocedural laboratory examination: Secondary | ICD-10-CM | POA: Diagnosis not present

## 2014-03-09 DIAGNOSIS — I10 Essential (primary) hypertension: Secondary | ICD-10-CM | POA: Diagnosis present

## 2014-03-09 DIAGNOSIS — M19011 Primary osteoarthritis, right shoulder: Secondary | ICD-10-CM | POA: Diagnosis present

## 2014-03-09 DIAGNOSIS — K219 Gastro-esophageal reflux disease without esophagitis: Secondary | ICD-10-CM | POA: Diagnosis not present

## 2014-03-09 DIAGNOSIS — Z79899 Other long term (current) drug therapy: Secondary | ICD-10-CM | POA: Diagnosis not present

## 2014-03-09 DIAGNOSIS — M75101 Unspecified rotator cuff tear or rupture of right shoulder, not specified as traumatic: Secondary | ICD-10-CM | POA: Diagnosis present

## 2014-03-09 DIAGNOSIS — E782 Mixed hyperlipidemia: Secondary | ICD-10-CM | POA: Diagnosis present

## 2014-03-09 DIAGNOSIS — F419 Anxiety disorder, unspecified: Secondary | ICD-10-CM | POA: Diagnosis present

## 2014-03-09 DIAGNOSIS — M810 Age-related osteoporosis without current pathological fracture: Secondary | ICD-10-CM | POA: Diagnosis present

## 2014-03-09 DIAGNOSIS — J45909 Unspecified asthma, uncomplicated: Secondary | ICD-10-CM | POA: Diagnosis present

## 2014-03-09 HISTORY — DX: Pneumonia, unspecified organism: J18.9

## 2014-03-09 HISTORY — DX: Unspecified osteoarthritis, unspecified site: M19.90

## 2014-03-09 HISTORY — DX: Nausea with vomiting, unspecified: Z98.890

## 2014-03-09 HISTORY — DX: Nausea with vomiting, unspecified: R11.2

## 2014-03-09 LAB — CBC
HCT: 42.5 % (ref 36.0–46.0)
HEMOGLOBIN: 14.9 g/dL (ref 12.0–15.0)
MCH: 31.1 pg (ref 26.0–34.0)
MCHC: 35.1 g/dL (ref 30.0–36.0)
MCV: 88.7 fL (ref 78.0–100.0)
Platelets: 234 10*3/uL (ref 150–400)
RBC: 4.79 MIL/uL (ref 3.87–5.11)
RDW: 13 % (ref 11.5–15.5)
WBC: 6.1 10*3/uL (ref 4.0–10.5)

## 2014-03-09 LAB — BASIC METABOLIC PANEL
Anion gap: 15 (ref 5–15)
BUN: 18 mg/dL (ref 6–23)
CHLORIDE: 103 meq/L (ref 96–112)
CO2: 22 mEq/L (ref 19–32)
Calcium: 9.3 mg/dL (ref 8.4–10.5)
Creatinine, Ser: 0.71 mg/dL (ref 0.50–1.10)
GFR calc Af Amer: >90 mL/min (ref >90)
GFR calc non Af Amer: 87 mL/min — ABNORMAL LOW (ref >90)
GLUCOSE: 103 mg/dL — AB (ref 70–99)
POTASSIUM: 4.5 meq/L (ref 3.7–5.3)
Sodium: 140 mEq/L (ref 137–147)

## 2014-03-09 LAB — ABO/RH: ABO/RH(D): A POS

## 2014-03-09 LAB — TYPE AND SCREEN
ABO/RH(D): A POS
Antibody Screen: NEGATIVE

## 2014-03-09 NOTE — Pre-Procedure Instructions (Addendum)
Breanna Mason  03/09/2014   Your procedure is scheduled on:  03/11/14  Report to  [facility] short stay admitting at 800 AM.  Call this number if you have problems the morning of [phone]   Remember:   Do not eat food or drink liquids after midnight.   Take these medicines the morning of surgery with A SIP OF WATER: inhalers if needed(bring albuterol), xanax if needed       Take all meds as needed until day of surgery except as instructed below or per dr   Bridgette Habermann all herbel meds, nsaids (aleve,naproxen,advil,ibuprofen) now including aspirin, vitamins   Do not wear jewelry, make-up or nail polish.  Do not wear lotions, powders, or perfumes. You may wear deodorant.  Do not shave 48 hours prior to surgery. Men may shave face and neck.  Do not bring valuables to the hospital.  [facility] is not responsible                  for any belongings or valuables.               Contacts, dentures or bridgework may not be worn into surgery.  Leave suitcase in the car. After surgery it may be brought to your room.  For patients admitted to the hospital, discharge time is determined by your                treatment team.               Patients discharged the day of surgery will not be allowed to drive  home.  Name and phone number of your driver:   Special Instructions:  Special Instructions: [facility] - Preparing for Surgery  Before surgery, you can play an important role.  Because skin is not sterile, your skin needs to be as free of germs as possible.  You can reduce the number of germs on you skin by washing with CHG (chlorahexidine gluconate) soap before surgery.  CHG is an antiseptic cleaner which kills germs and bonds with the skin to continue killing germs even after washing.  Please DO NOT use if you have an allergy to CHG or antibacterial soaps.  If your skin becomes reddened/irritated stop using the CHG and inform your nurse when you arrive at Short Stay.  Do not shave  (including legs and underarms) for at least 48 hours prior to the first CHG shower.  You may shave your face.  Please follow these instructions carefully:   1.  Shower with CHG Soap the night before surgery and the morning of Surgery.  2.  If you choose to wash your hair, wash your hair first as usual with your normal shampoo.  3.  After you shampoo, rinse your hair and body thoroughly to remove the Shampoo.  4.  Use CHG as you would any other liquid soap.  You can apply chg directly  to the skin and wash gently with scrungie or a clean washcloth.  5.  Apply the CHG Soap to your body ONLY FROM THE NECK DOWN.  Do not use on open wounds or open sores.  Avoid contact with your eyes ears, mouth and genitals (private parts).  Wash genitals (private parts)       with your normal soap.  6.  Wash thoroughly, paying special attention to the area where your surgery will be performed.  7.  Thoroughly rinse your body with warm water from the neck down.  8.  DO NOT shower/wash with your normal soap after using and rinsing off the CHG Soap.  9.  Pat yourself dry with a clean towel.            10.  Wear clean pajamas.            11.  Place clean sheets on your bed the night of your first shower and do not sleep with pets.  Day of Surgery  Do not apply any lotions/deodorants the morning of surgery.  Please wear clean clothes to the hospital/surgery center.   Please read over the following fact sheets that you were given: Pain Booklet, Coughing and Deep Breathing and Surgical Site Infection Prevention

## 2014-03-10 MED ORDER — CEFAZOLIN SODIUM-DEXTROSE 2-3 GM-% IV SOLR
2.0000 g | INTRAVENOUS | Status: AC
  Administered 2014-03-11: 2 g via INTRAVENOUS
  Filled 2014-03-10: qty 50

## 2014-03-10 MED ORDER — CHLORHEXIDINE GLUCONATE 4 % EX LIQD
60.0000 mL | Freq: Once | CUTANEOUS | Status: DC
  Filled 2014-03-10: qty 60

## 2014-03-10 MED ORDER — LACTATED RINGERS IV SOLN
INTRAVENOUS | Status: DC
  Administered 2014-03-11 (×2): via INTRAVENOUS

## 2014-03-11 ENCOUNTER — Inpatient Hospital Stay (HOSPITAL_COMMUNITY): Payer: Medicare Other | Admitting: Anesthesiology

## 2014-03-11 ENCOUNTER — Inpatient Hospital Stay (HOSPITAL_COMMUNITY)
Admission: RE | Admit: 2014-03-11 | Discharge: 2014-03-12 | DRG: 483 | Disposition: A | Discharge status: Home Health | Payer: Medicare Other | Source: Ambulatory Visit | Attending: Orthopedic Surgery | Admitting: Orthopedic Surgery

## 2014-03-11 ENCOUNTER — Encounter (HOSPITAL_COMMUNITY): Payer: Self-pay | Admitting: Anesthesiology

## 2014-03-11 ENCOUNTER — Encounter (HOSPITAL_COMMUNITY)
Admission: RE | Disposition: A | Discharge status: Home Health | Payer: Self-pay | Source: Ambulatory Visit | Attending: Orthopedic Surgery

## 2014-03-11 DIAGNOSIS — E782 Mixed hyperlipidemia: Secondary | ICD-10-CM | POA: Diagnosis present

## 2014-03-11 DIAGNOSIS — F419 Anxiety disorder, unspecified: Secondary | ICD-10-CM | POA: Diagnosis present

## 2014-03-11 DIAGNOSIS — M12811 Other specific arthropathies, not elsewhere classified, right shoulder: Secondary | ICD-10-CM | POA: Diagnosis not present

## 2014-03-11 DIAGNOSIS — Z79899 Other long term (current) drug therapy: Secondary | ICD-10-CM | POA: Diagnosis not present

## 2014-03-11 DIAGNOSIS — Z01812 Encounter for preprocedural laboratory examination: Secondary | ICD-10-CM

## 2014-03-11 DIAGNOSIS — M810 Age-related osteoporosis without current pathological fracture: Secondary | ICD-10-CM | POA: Diagnosis present

## 2014-03-11 DIAGNOSIS — G8918 Other acute postprocedural pain: Secondary | ICD-10-CM | POA: Diagnosis not present

## 2014-03-11 DIAGNOSIS — I10 Essential (primary) hypertension: Secondary | ICD-10-CM | POA: Diagnosis present

## 2014-03-11 DIAGNOSIS — M75101 Unspecified rotator cuff tear or rupture of right shoulder, not specified as traumatic: Secondary | ICD-10-CM | POA: Diagnosis not present

## 2014-03-11 DIAGNOSIS — J45909 Unspecified asthma, uncomplicated: Secondary | ICD-10-CM | POA: Diagnosis present

## 2014-03-11 DIAGNOSIS — K219 Gastro-esophageal reflux disease without esophagitis: Secondary | ICD-10-CM | POA: Diagnosis not present

## 2014-03-11 DIAGNOSIS — M19011 Primary osteoarthritis, right shoulder: Principal | ICD-10-CM | POA: Diagnosis present

## 2014-03-11 DIAGNOSIS — Z96619 Presence of unspecified artificial shoulder joint: Secondary | ICD-10-CM

## 2014-03-11 HISTORY — PX: REVERSE SHOULDER ARTHROPLASTY: SHX5054

## 2014-03-11 SURGERY — ARTHROPLASTY, SHOULDER, TOTAL, REVERSE
Anesthesia: General | Site: Shoulder | Laterality: Right

## 2014-03-11 MED ORDER — ALUM & MAG HYDROXIDE-SIMETH 200-200-20 MG/5ML PO SUSP: 30.0000 mL | ORAL | Status: DC | PRN

## 2014-03-11 MED ORDER — SCOPOLAMINE 1 MG/3DAYS TD PT72
1.0000 | MEDICATED_PATCH | TRANSDERMAL | Status: DC
  Administered 2014-03-11: 1.5 mg via TRANSDERMAL
  Filled 2014-03-11: qty 1

## 2014-03-11 MED ORDER — NEOSTIGMINE METHYLSULFATE 10 MG/10ML IV SOLN
INTRAVENOUS | Status: DC | PRN
  Administered 2014-03-11: 3 mg via INTRAVENOUS

## 2014-03-11 MED ORDER — MEPERIDINE HCL 25 MG/ML IJ SOLN: 6.2500 mg | INTRAMUSCULAR | Status: DC | PRN

## 2014-03-11 MED ORDER — LIDOCAINE HCL (CARDIAC) 20 MG/ML IV SOLN
INTRAVENOUS | Status: DC | PRN
  Administered 2014-03-11: 100 mg via INTRAVENOUS

## 2014-03-11 MED ORDER — LIDOCAINE HCL (CARDIAC) 20 MG/ML IV SOLN
INTRAVENOUS | Status: AC
  Filled 2014-03-11: qty 5

## 2014-03-11 MED ORDER — GLYCOPYRROLATE 0.2 MG/ML IJ SOLN
INTRAMUSCULAR | Status: AC
  Filled 2014-03-11: qty 2

## 2014-03-11 MED ORDER — DIAZEPAM 5 MG PO TABS
ORAL_TABLET | ORAL | Status: AC
  Administered 2014-03-11: 5 mg via ORAL
  Filled 2014-03-11: qty 1

## 2014-03-11 MED ORDER — OXYCODONE-ACETAMINOPHEN 5-325 MG PO TABS
1.0000 | ORAL_TABLET | ORAL | Status: DC | PRN
  Administered 2014-03-11 – 2014-03-12 (×3): 2 via ORAL
  Filled 2014-03-11 (×3): qty 2

## 2014-03-11 MED ORDER — MIDAZOLAM HCL 2 MG/2ML IJ SOLN
1.0000 mg | INTRAMUSCULAR | Status: DC | PRN
  Administered 2014-03-11: 1 mg via INTRAVENOUS
  Filled 2014-03-11: qty 2

## 2014-03-11 MED ORDER — ROCURONIUM BROMIDE 100 MG/10ML IV SOLN
INTRAVENOUS | Status: DC | PRN
  Administered 2014-03-11: 40 mg via INTRAVENOUS

## 2014-03-11 MED ORDER — DIAZEPAM 5 MG PO TABS
5.0000 mg | ORAL_TABLET | Freq: Four times a day (QID) | ORAL | Status: DC | PRN
  Administered 2014-03-11: 5 mg via ORAL

## 2014-03-11 MED ORDER — 0.9 % SODIUM CHLORIDE (POUR BTL) OPTIME
TOPICAL | Status: DC | PRN
  Administered 2014-03-11: 1000 mL

## 2014-03-11 MED ORDER — ONDANSETRON HCL 4 MG/2ML IJ SOLN: 4.0000 mg | Freq: Four times a day (QID) | INTRAMUSCULAR | Status: DC | PRN

## 2014-03-11 MED ORDER — LACTATED RINGERS IV SOLN
INTRAVENOUS | Status: DC
  Administered 2014-03-11: 09:00:00 via INTRAVENOUS

## 2014-03-11 MED ORDER — FENTANYL CITRATE 0.05 MG/ML IJ SOLN
25.0000 ug | INTRAMUSCULAR | Status: DC | PRN
  Administered 2014-03-11 (×2): 50 ug via INTRAVENOUS

## 2014-03-11 MED ORDER — FENTANYL CITRATE 0.05 MG/ML IJ SOLN
INTRAMUSCULAR | Status: AC
  Administered 2014-03-11: 50 ug via INTRAVENOUS
  Filled 2014-03-11: qty 2

## 2014-03-11 MED ORDER — DOCUSATE SODIUM 100 MG PO CAPS
100.0000 mg | ORAL_CAPSULE | Freq: Two times a day (BID) | ORAL | Status: DC
  Administered 2014-03-11 – 2014-03-12 (×2): 100 mg via ORAL
  Filled 2014-03-11 (×3): qty 1

## 2014-03-11 MED ORDER — ALBUTEROL SULFATE HFA 108 (90 BASE) MCG/ACT IN AERS: 2.0000 | INHALATION_SPRAY | Freq: Four times a day (QID) | RESPIRATORY_TRACT | Status: DC | PRN

## 2014-03-11 MED ORDER — CEFAZOLIN SODIUM 1-5 GM-% IV SOLN
1.0000 g | Freq: Four times a day (QID) | INTRAVENOUS | Status: AC
  Administered 2014-03-11 – 2014-03-12 (×3): 1 g via INTRAVENOUS
  Filled 2014-03-11 (×4): qty 50

## 2014-03-11 MED ORDER — ONDANSETRON HCL 4 MG PO TABS: 4.0000 mg | ORAL_TABLET | Freq: Four times a day (QID) | ORAL | Status: DC | PRN

## 2014-03-11 MED ORDER — LACTATED RINGERS IV SOLN
INTRAVENOUS | Status: DC
  Administered 2014-03-11 – 2014-03-12 (×2): via INTRAVENOUS

## 2014-03-11 MED ORDER — BUDESONIDE-FORMOTEROL FUMARATE 160-4.5 MCG/ACT IN AERO
1.0000 | INHALATION_SPRAY | Freq: Two times a day (BID) | RESPIRATORY_TRACT | Status: DC
  Filled 2014-03-11: qty 6

## 2014-03-11 MED ORDER — SODIUM CHLORIDE 0.9 % IV SOLN
10.0000 mg | INTRAVENOUS | Status: DC | PRN
  Administered 2014-03-11: 20 ug/min via INTRAVENOUS

## 2014-03-11 MED ORDER — PRAVASTATIN SODIUM 40 MG PO TABS
40.0000 mg | ORAL_TABLET | Freq: Every day | ORAL | Status: DC
  Administered 2014-03-11: 40 mg via ORAL
  Filled 2014-03-11 (×2): qty 1

## 2014-03-11 MED ORDER — FLEET ENEMA 7-19 GM/118ML RE ENEM: 1.0000 | ENEMA | Freq: Once | RECTAL | Status: AC | PRN

## 2014-03-11 MED ORDER — ENALAPRIL MALEATE 10 MG PO TABS
10.0000 mg | ORAL_TABLET | Freq: Every day | ORAL | Status: DC
  Administered 2014-03-11: 10 mg via ORAL
  Filled 2014-03-11 (×2): qty 1

## 2014-03-11 MED ORDER — SCOPOLAMINE 1 MG/3DAYS TD PT72: 1.0000 | MEDICATED_PATCH | TRANSDERMAL | Status: DC

## 2014-03-11 MED ORDER — CALCIUM CARBONATE-VITAMIN D 500-200 MG-UNIT PO TABS
1.0000 | ORAL_TABLET | Freq: Every day | ORAL | Status: DC
  Administered 2014-03-11 – 2014-03-12 (×2): 1 via ORAL
  Filled 2014-03-11 (×2): qty 1

## 2014-03-11 MED ORDER — KETOROLAC TROMETHAMINE 15 MG/ML IJ SOLN
15.0000 mg | Freq: Four times a day (QID) | INTRAMUSCULAR | Status: DC
  Administered 2014-03-11 – 2014-03-12 (×4): 15 mg via INTRAVENOUS
  Filled 2014-03-11 (×7): qty 1

## 2014-03-11 MED ORDER — BISACODYL 5 MG PO TBEC: 5.0000 mg | DELAYED_RELEASE_TABLET | Freq: Every day | ORAL | Status: DC | PRN

## 2014-03-11 MED ORDER — GLYCOPYRROLATE 0.2 MG/ML IJ SOLN
INTRAMUSCULAR | Status: DC | PRN
  Administered 2014-03-11: 0.4 mg via INTRAVENOUS

## 2014-03-11 MED ORDER — ACETAMINOPHEN 650 MG RE SUPP: 650.0000 mg | Freq: Four times a day (QID) | RECTAL | Status: DC | PRN

## 2014-03-11 MED ORDER — MECLIZINE HCL 25 MG PO TABS
25.0000 mg | ORAL_TABLET | Freq: Every day | ORAL | Status: DC | PRN
  Filled 2014-03-11: qty 1

## 2014-03-11 MED ORDER — ACETAMINOPHEN 325 MG PO TABS: 650.0000 mg | ORAL_TABLET | Freq: Four times a day (QID) | ORAL | Status: DC | PRN

## 2014-03-11 MED ORDER — POLYETHYLENE GLYCOL 3350 17 G PO PACK: 17.0000 g | PACK | Freq: Every day | ORAL | Status: DC | PRN

## 2014-03-11 MED ORDER — FENTANYL CITRATE 0.05 MG/ML IJ SOLN
INTRAMUSCULAR | Status: DC | PRN
  Administered 2014-03-11: 100 ug via INTRAVENOUS

## 2014-03-11 MED ORDER — OXYCODONE-ACETAMINOPHEN 5-325 MG PO TABS
ORAL_TABLET | ORAL | Status: AC
  Administered 2014-03-11: 2 via ORAL
  Filled 2014-03-11: qty 2

## 2014-03-11 MED ORDER — PROPOFOL 10 MG/ML IV BOLUS
INTRAVENOUS | Status: DC | PRN
Start: 2014-03-11 — End: 2014-03-11
  Administered 2014-03-11: 130 mg via INTRAVENOUS

## 2014-03-11 MED ORDER — PROMETHAZINE HCL 25 MG/ML IJ SOLN
6.2500 mg | INTRAMUSCULAR | Status: DC | PRN
Start: 2014-03-11 — End: 2014-03-11

## 2014-03-11 MED ORDER — KETOROLAC TROMETHAMINE 15 MG/ML IJ SOLN
INTRAMUSCULAR | Status: AC
  Administered 2014-03-11: 15 mg via INTRAVENOUS
  Filled 2014-03-11: qty 1

## 2014-03-11 MED ORDER — BUPIVACAINE-EPINEPHRINE (PF) 0.5% -1:200000 IJ SOLN
INTRAMUSCULAR | Status: DC | PRN
  Administered 2014-03-11: 25 mL

## 2014-03-11 MED ORDER — DIPHENHYDRAMINE HCL 12.5 MG/5ML PO ELIX: 12.5000 mg | ORAL_SOLUTION | ORAL | Status: DC | PRN

## 2014-03-11 MED ORDER — MENTHOL 3 MG MT LOZG: 1.0000 | LOZENGE | OROMUCOSAL | Status: DC | PRN

## 2014-03-11 MED ORDER — MIDAZOLAM HCL 2 MG/2ML IJ SOLN
INTRAMUSCULAR | Status: AC
  Filled 2014-03-11: qty 2

## 2014-03-11 MED ORDER — METOCLOPRAMIDE HCL 10 MG PO TABS: 5.0000 mg | ORAL_TABLET | Freq: Three times a day (TID) | ORAL | Status: DC | PRN

## 2014-03-11 MED ORDER — FENTANYL CITRATE 0.05 MG/ML IJ SOLN
INTRAMUSCULAR | Status: AC
  Filled 2014-03-11: qty 5

## 2014-03-11 MED ORDER — HYDROMORPHONE HCL 1 MG/ML IJ SOLN: 0.2500 mg | INTRAMUSCULAR | Status: DC | PRN

## 2014-03-11 MED ORDER — PHENOL 1.4 % MT LIQD: 1.0000 | OROMUCOSAL | Status: DC | PRN

## 2014-03-11 MED ORDER — ALBUTEROL SULFATE (2.5 MG/3ML) 0.083% IN NEBU: 2.5000 mg | INHALATION_SOLUTION | Freq: Four times a day (QID) | RESPIRATORY_TRACT | Status: DC | PRN

## 2014-03-11 MED ORDER — FENTANYL CITRATE 0.05 MG/ML IJ SOLN
50.0000 ug | INTRAMUSCULAR | Status: DC | PRN
  Administered 2014-03-11: 100 ug via INTRAVENOUS
  Filled 2014-03-11: qty 2

## 2014-03-11 MED ORDER — ONDANSETRON HCL 4 MG/2ML IJ SOLN
INTRAMUSCULAR | Status: AC
  Filled 2014-03-11: qty 2

## 2014-03-11 MED ORDER — METOCLOPRAMIDE HCL 5 MG/ML IJ SOLN: 5.0000 mg | Freq: Three times a day (TID) | INTRAMUSCULAR | Status: DC | PRN

## 2014-03-11 MED ORDER — ONDANSETRON HCL 4 MG/2ML IJ SOLN
INTRAMUSCULAR | Status: DC | PRN
  Administered 2014-03-11: 4 mg via INTRAVENOUS

## 2014-03-11 MED ORDER — ROCURONIUM BROMIDE 50 MG/5ML IV SOLN
INTRAVENOUS | Status: AC
  Filled 2014-03-11: qty 1

## 2014-03-11 MED ORDER — PROPOFOL 10 MG/ML IV BOLUS
INTRAVENOUS | Status: AC
  Filled 2014-03-11: qty 20

## 2014-03-11 SURGICAL SUPPLY — 71 items
ADH SKN CLS APL DERMABOND .7 (GAUZE/BANDAGES/DRESSINGS) ×1
BIT DRILL 170X2.5X (BIT) IMPLANT
BIT DRL 170X2.5X (BIT)
BLADE SAW SGTL 83.5X18.5 (BLADE) ×3 IMPLANT
BRUSH FEMORAL CANAL (MISCELLANEOUS) IMPLANT
CAPT SHOULD DELTAXTEND CEM MOD ×2 IMPLANT
COVER SURGICAL LIGHT HANDLE (MISCELLANEOUS) ×3 IMPLANT
DERMABOND ADVANCED (GAUZE/BANDAGES/DRESSINGS) ×2
DERMABOND ADVANCED .7 DNX12 (GAUZE/BANDAGES/DRESSINGS) ×1 IMPLANT
DRAPE ORTHO SPLIT 77X108 STRL (DRAPES) ×6
DRAPE SURG 17X11 SM STRL (DRAPES) ×3 IMPLANT
DRAPE SURG ORHT 6 SPLT 77X108 (DRAPES) ×2 IMPLANT
DRILL 2.5 (BIT)
DRILL BIT 7/64X5 (BIT) ×3 IMPLANT
DRSG AQUACEL AG ADV 3.5X10 (GAUZE/BANDAGES/DRESSINGS) ×3 IMPLANT
DRSG MEPILEX BORDER 4X8 (GAUZE/BANDAGES/DRESSINGS) IMPLANT
DURAPREP 26ML APPLICATOR (WOUND CARE) ×3 IMPLANT
ELECT BLADE 4.0 EZ CLEAN MEGAD (MISCELLANEOUS) ×3
ELECT CAUTERY BLADE 6.4 (BLADE) ×3 IMPLANT
ELECT REM PT RETURN 9FT ADLT (ELECTROSURGICAL) ×3
ELECTRODE BLDE 4.0 EZ CLN MEGD (MISCELLANEOUS) ×1 IMPLANT
ELECTRODE REM PT RTRN 9FT ADLT (ELECTROSURGICAL) ×1 IMPLANT
FACESHIELD WRAPAROUND (MASK) ×6 IMPLANT
FACESHIELD WRAPAROUND OR TEAM (MASK) ×3 IMPLANT
GLOVE BIO SURGEON STRL SZ7.5 (GLOVE) ×3 IMPLANT
GLOVE BIO SURGEON STRL SZ8 (GLOVE) ×3 IMPLANT
GLOVE EUDERMIC 7 POWDERFREE (GLOVE) ×3 IMPLANT
GLOVE SS BIOGEL STRL SZ 7.5 (GLOVE) ×1 IMPLANT
GLOVE SUPERSENSE BIOGEL SZ 7.5 (GLOVE) ×2
GOWN STRL REUS W/ TWL LRG LVL3 (GOWN DISPOSABLE) IMPLANT
GOWN STRL REUS W/ TWL XL LVL3 (GOWN DISPOSABLE) ×2 IMPLANT
GOWN STRL REUS W/TWL LRG LVL3 (GOWN DISPOSABLE) ×6
GOWN STRL REUS W/TWL XL LVL3 (GOWN DISPOSABLE) ×6
HANDPIECE INTERPULSE COAX TIP (DISPOSABLE)
KIT BASIN OR (CUSTOM PROCEDURE TRAY) ×3 IMPLANT
KIT ROOM TURNOVER OR (KITS) ×3 IMPLANT
MANIFOLD NEPTUNE II (INSTRUMENTS) ×3 IMPLANT
NDL HYPO 25GX1X1/2 BEV (NEEDLE) IMPLANT
NDL SUT 6 .5 CRC .975X.05 MAYO (NEEDLE) IMPLANT
NEEDLE HYPO 25GX1X1/2 BEV (NEEDLE) IMPLANT
NEEDLE MAYO TAPER (NEEDLE) ×3
NS IRRIG 1000ML POUR BTL (IV SOLUTION) ×3 IMPLANT
PACK SHOULDER (CUSTOM PROCEDURE TRAY) ×3 IMPLANT
PAD ARMBOARD 7.5X6 YLW CONV (MISCELLANEOUS) ×6 IMPLANT
PASSER SUT SWANSON 36MM LOOP (INSTRUMENTS) IMPLANT
PIN GUIDE 1.2 (PIN) IMPLANT
PIN GUIDE GLENOPHERE 1.5MX300M (PIN) IMPLANT
PIN METAGLENE 2.5 (PIN) IMPLANT
PRESSURIZER FEMORAL UNIV (MISCELLANEOUS) IMPLANT
SET HNDPC FAN SPRY TIP SCT (DISPOSABLE) IMPLANT
SLING ARM FOAM STRAP LRG (SOFTGOODS) ×2 IMPLANT
SPONGE LAP 18X18 X RAY DECT (DISPOSABLE) ×6 IMPLANT
SPONGE LAP 4X18 X RAY DECT (DISPOSABLE) IMPLANT
SUCTION FRAZIER TIP 10 FR DISP (SUCTIONS) ×3 IMPLANT
SUT BONE WAX W31G (SUTURE) IMPLANT
SUT FIBERWIRE #2 38 T-5 BLUE (SUTURE) ×9
SUT MNCRL AB 3-0 PS2 18 (SUTURE) ×3 IMPLANT
SUT VIC AB 1 CT1 27 (SUTURE) ×3
SUT VIC AB 1 CT1 27XBRD ANBCTR (SUTURE) ×1 IMPLANT
SUT VIC AB 2-0 CT1 27 (SUTURE) ×6
SUT VIC AB 2-0 CT1 TAPERPNT 27 (SUTURE) ×1 IMPLANT
SUT VIC AB 2-0 SH 27 (SUTURE)
SUT VIC AB 2-0 SH 27X BRD (SUTURE) IMPLANT
SUTURE FIBERWR #2 38 T-5 BLUE (SUTURE) ×2 IMPLANT
SYR 30ML SLIP (SYRINGE) ×3 IMPLANT
SYR CONTROL 10ML LL (SYRINGE) IMPLANT
TOWEL OR 17X24 6PK STRL BLUE (TOWEL DISPOSABLE) ×3 IMPLANT
TOWEL OR 17X26 10 PK STRL BLUE (TOWEL DISPOSABLE) ×3 IMPLANT
TOWER CARTRIDGE SMART MIX (DISPOSABLE) IMPLANT
TRAY FOLEY CATH 16FRSI W/METER (SET/KITS/TRAYS/PACK) IMPLANT
WATER STERILE IRR 1000ML POUR (IV SOLUTION) ×1 IMPLANT

## 2014-03-11 NOTE — Anesthesia Postprocedure Evaluation (Signed)
  Anesthesia Post-op Note  Patient: Breanna Mason  Procedure(s) Performed: Procedure(s): RIGHT REVERSE SHOULDER ARTHROPLASTY (Right)  Patient Location: PACU  Anesthesia Type:General  Level of Consciousness: awake, alert  and oriented  Airway and Oxygen Therapy: Patient Spontanous Breathing  Post-op Pain: mild  Post-op Assessment: Post-op Vital signs reviewed, Patient's Cardiovascular Status Stable, Respiratory Function Stable, Patent Airway and No signs of Nausea or vomiting  Post-op Vital Signs: Reviewed and stable  Last Vitals:  Filed Vitals:   03/11/14 1222  BP: 143/70  Pulse: 86  Temp: 36.6 C  Resp: 15    Complications: No apparent anesthesia complications

## 2014-03-11 NOTE — Transfer of Care (Signed)
Immediate Anesthesia Transfer of Care Note  Patient: Breanna Mason  Procedure(s) Performed: Procedure(s): RIGHT REVERSE SHOULDER ARTHROPLASTY (Right)  Patient Location: PACU  Anesthesia Type:General  Level of Consciousness: awake, alert  and oriented  Airway & Oxygen Therapy: Patient Spontanous Breathing and Patient connected to nasal cannula oxygen  Post-op Assessment: Report given to PACU RN and Post -op Vital signs reviewed and stable  Post vital signs: Reviewed and stable  Complications: No apparent anesthesia complications

## 2014-03-11 NOTE — Op Note (Signed)
03/11/2014  12:04 PM  PATIENT:   Breanna Mason  68 y.o. female  PRE-OPERATIVE DIAGNOSIS:  right shoulder cuff tear arthroplathy  POST-OPERATIVE DIAGNOSIS:  same  PROCEDURE:  Right RSA #8 stem, +3 poly, 38 standard glenosphere  SURGEON:  , Metta Clines M.D.  ASSISTANTS: Shuford pac   ANESTHESIA:   GET + ISB  EBL: 200  SPECIMEN:  none  Drains: none   PATIENT DISPOSITION:  PACU - hemodynamically stable.    PLAN OF CARE: Admit to inpatient   Dictation# [id number]

## 2014-03-11 NOTE — H&P (Signed)
[place]    Chief Complaint: right shoulder cuff tear arthroplathy HPI: The patient is a 68 y.o. female with end stage right shoulder rotator cuff tear arthropathy  Past Medical History  Diagnosis Date  . Essential hypertension, benign   . GERD (gastroesophageal reflux disease)   . Asthma   . Mixed hyperlipidemia   . Impaired glucose tolerance   . Osteoporosis   . Anxiety   . Osteopenia   . Reflux   . IFG (impaired fasting glucose)   . Allergy   . PONV (postoperative nausea and vomiting)   . Pneumonia     hx  . Arthritis     Past Surgical History  Procedure Laterality Date  . Cesarean section    . Bilateral tubal ligation    . Shoulder surgery Right     x4  . Colonoscopy N/A 09/17/2013    Procedure: COLONOSCOPY;  Surgeon: Rogene Houston, MD;  Location: [facility];  Service: Endoscopy;  Laterality: N/A;  1200  . Tubal ligation    . Carpal tunnel release Right     30 yrs    Family History  Problem Relation Age of Onset  . Hypertension Father   . Diabetes type II Father     Social History:  reports that she has never smoked. She has never used smokeless tobacco. She reports that she does not drink alcohol or use illicit drugs.  Allergies:  Allergies  Allergen Reactions  . Penicillins Other (See Comments)    " Makes me sick"    Medications Prior to Admission  Medication Sig Dispense Refill  . albuterol (PROVENTIL HFA;VENTOLIN HFA) 108 (90 BASE) MCG/ACT inhaler Inhale 2 puffs into the lungs every 6 (six) hours as needed for wheezing or shortness of breath. 1 Inhaler 2  . alendronate (FOSAMAX) 70 MG tablet TAKE ONE TABLET BY MOUTH ONCE A WEEK 12 tablet 1  . ALPRAZolam (XANAX) 0.5 MG tablet TAKE ONE TABLET BY MOUTH EVERY DAY AS NEEDED FOR ANXIETY 30 tablet 5  . budesonide-formoterol (SYMBICORT) 160-4.5 MCG/ACT inhaler Inhale 1 puff into the lungs 2 (two) times daily. 3 Inhaler 1  . calcium-vitamin D (OSCAL WITH D) 500-200 MG-UNIT per tablet Take 1 tablet  by mouth daily.      [name] Kitchen docusate sodium (COLACE) 50 MG capsule Take 50 mg by mouth as needed for mild constipation. duralax    . enalapril (VASOTEC) 10 MG tablet Take 1 tablet (10 mg total) by mouth at bedtime. TAKE ONE TABLET BY MOUTH EVERY DAY 90 tablet 1  . meclizine (ANTIVERT) 25 MG tablet Take 1 tablet (25 mg total) by mouth as needed. 30 tablet 0  . pravastatin (PRAVACHOL) 40 MG tablet Take 1 tablet (40 mg total) by mouth at bedtime. 90 tablet 1     Physical Exam: right shoulder with painful and restricted motion as noted at recent office vistis  Vitals  Temp:  [98 F (36.7 C)] 98 F (36.7 C) (11/05 0811) Pulse Rate:  [92] 92 (11/05 0811) Resp:  [18] 18 (11/05 0811) BP: (128)/(45) 128/45 mmHg (11/05 0811) SpO2:  [98 %] 98 % (11/05 0811) Weight:  [67.132 kg (148 lb)] 67.132 kg (148 lb) (11/05 0811)  Assessment/Plan  Impression: right shoulder cuff tear arthroplathy  Plan of Action: Procedure(s): RIGHT REVERSE SHOULDER ARTHROPLASTY  , M 03/11/2014, 9:32 AM

## 2014-03-11 NOTE — Discharge Instructions (Signed)
° °  Kevin M. Supple, M.D., F.A.A.O.S. °Orthopaedic Surgery °Specializing in Arthroscopic and Reconstructive °Surgery of the Shoulder and Knee °[phone] °[address] - [place], [place] [address] - Fax [phone] ° ° °POST-OP TOTAL SHOULDER REPLACEMENT/SHOULDER HEMIARTHROPLASTY INSTRUCTIONS ° °1. Call the office at [phone] to schedule your first post-op appointment 10-14 days from the date of your surgery. ° °2. The bandage over your incision is waterproof. You may begin showering with this dressing on. You may leave this dressing on until first follow up appointment within 2 weeks. If you would like to remove it you may do so after the 5th day. Go slow and tug at the borders gently to break the bond the dressing has with the skin. The steri strips may come off with the dressing. At this point if there is no drainage it is okay to go without a bandage or you may cover it with a light guaze and tape. Leave the steri-strips in place over your incision. You can expect drainage that is bloody or yellow in nature that should gradually decrease from day of surgery. Change your dressing daily until drainage is completely resolved, then you may feel free to go without a bandage. You can also expect significant bruising around your shoulder that will drift down your arm and into your chest wall. This is very normal and should resolve over several days. ° ° 3. Wear your sling/immobilizer at all times except to perform the exercises below or to occasionally let your arm dangle by your side to stretch your elbow. You also need to sleep in your sling immobilizer until instructed otherwise. ° °4. Range of motion to your elbow, wrist, and hand are encouraged 3-5 times daily. Exercise to your hand and fingers helps to reduce swelling you may experience. ° °5. Utilize ice to the shoulder 3-5 times minimum a day and additionally if you are experiencing pain. ° °6. Prescriptions for a pain medication and a muscle  relaxant are provided for you. It is recommended that if you are experiencing pain that you pain medication alone is not controlling, add the muscle relaxant along with the pain medication which can give additional pain relief. The first 1-2 days is generally the most severe of your pain and then should gradually decrease. As your pain lessens it is recommended that you decrease your use of the pain medications to an "as needed basis'" only and to always comply with the recommended dosages of the pain medications. ° °7. Pain medications can produce constipation along with their use. If you experience this, the use of an over the counter stool softener or laxative daily is recommended.  ° °8. For most patients, if insurance allows, home health services to include therapy has been arranged. ° °9. For additional questions or concerns, please do not hesitate to call the office. If after hours there is an answering service to forward your concerns to the physician on call. ° °POST-OP EXERCISES ° °Pendulum Exercises ° °Perform pendulum exercises while standing and bending at the waist. Support your uninvolved arm on a table or chair and allow your operated arm to hang freely. Make sure to do these exercises passively - not using you shoulder muscles. ° °Repeat 20 times. Do 3 sessions per day. ° ° ° ° °

## 2014-03-11 NOTE — Progress Notes (Signed)
Utilization review completed.  

## 2014-03-11 NOTE — Anesthesia Procedure Notes (Addendum)
Anesthesia Regional Block:  Supraclavicular block  Pre-Anesthetic Checklist: ,, timeout performed, Correct Patient, Correct Site, Correct Laterality, Correct Procedure, Correct Position, site marked, Risks and benefits discussed,  Surgical consent,  Pre-op evaluation,  At surgeon's request and post-op pain management   Prep: Maximum Sterile Barrier Precautions used and chloraprep       Needles:   Needle Type: Echogenic Stimulator Needle     Needle Length: 9cm 9 cm Needle Gauge: 22 and 22 G    Additional Needles:  Procedures: ultrasound guided (picture in chart) and nerve stimulator Supraclavicular block  Nerve Stimulator or Paresthesia:  Response: 0.5 mA,   Additional Responses:   Narrative:  Start time: 03/11/2014 9:21 AM End time: 03/11/2014 9:45 AM  Additional Notes: R supraclavicular block, [place], Stimulator, 25cc .5% marcaine with epi, multiple asp, talked throughout, no compleications   Procedure Name: Intubation Date/Time: 03/11/2014 10:16 AM Performed by: Rush Farmer E Pre-anesthesia Checklist: Patient identified, Emergency Drugs available, Suction available, Patient being monitored and Timeout performed Patient Re-evaluated:Patient Re-evaluated prior to inductionOxygen Delivery Method: Circle system utilized Preoxygenation: Pre-oxygenation with 100% oxygen Intubation Type: IV induction Ventilation: Mask ventilation without difficulty Laryngoscope Size: Mac and 3 Grade View: Grade I Tube type: Oral Tube size: 7.0 mm Number of attempts: 1 Airway Equipment and Method: Stylet Placement Confirmation: ETT inserted through vocal cords under direct vision,  positive ETCO2 and breath sounds checked- equal and bilateral Secured at: 21 cm Tube secured with: Tape Dental Injury: Teeth and Oropharynx as per pre-operative assessment

## 2014-03-11 NOTE — Anesthesia Preprocedure Evaluation (Addendum)
Anesthesia Evaluation  Patient identified by MRN, date of birth, ID band Patient awake    Reviewed: Allergy & Precautions, H&P , NPO status , Patient's Chart, lab work & pertinent test results  History of Anesthesia Complications (+) PONV  Airway Mallampati: I   Neck ROM: Full    Dental  (+) Teeth Intact, Partial Upper   Pulmonary asthma ,  Two inhalers, no smoke breath sounds clear to auscultation        Cardiovascular hypertension, Pt. on medications Rhythm:Regular  Stress ECHO 2014 normal, EF>55%   Neuro/Psych Anxiety    GI/Hepatic GERD-  Medicated,  Endo/Other    Renal/GU      Musculoskeletal   Abdominal (+)  Abdomen: soft.    Peds  Hematology   Anesthesia Other Findings   Reproductive/Obstetrics                         Anesthesia Physical Anesthesia Plan  ASA: II  Anesthesia Plan: General   Post-op Pain Management:    Induction: Intravenous  Airway Management Planned: Oral ETT  Additional Equipment:   Intra-op Plan:   Post-operative Plan: Extubation in OR  Informed Consent: I have reviewed the patients History and Physical, chart, labs and discussed the procedure including the risks, benefits and alternatives for the proposed anesthesia with the patient or authorized representative who has indicated his/her understanding and acceptance.     Plan Discussed with:   Anesthesia Plan Comments: (HX of vertigo and nausea when awakening, will give scope patch)       Anesthesia Quick Evaluation

## 2014-03-11 NOTE — Op Note (Signed)
NAMEBRAELYN, JENSON             ACCOUNT NO.:  [id number]  MEDICAL RECORD NO.:  [id number]  LOCATION:  [facility]                         FACILITY:  [facility]  PHYSICIAN:  Metta Clines. , M.D.  DATE OF BIRTH:  [birth year]  DATE OF PROCEDURE:  03/11/2014 DATE OF DISCHARGE:                              OPERATIVE REPORT   PREOPERATIVE DIAGNOSIS:  End-stage right shoulder rotator cuff tear arthropathy.  POSTOPERATIVE DIAGNOSIS:  End-stage right shoulder rotator cuff tear arthropathy.  PROCEDURE:  A right reverse shoulder arthroplasty, utilizing a press-fit size 8 DePuy stem, +3 polyethylene insert, and a 38 standard glenosphere.  SURGEON:  Metta Clines. , M.D.  Terrence DupontOlivia Mackie A. Shuford, PA-C  ANESTHESIA:  General endotracheal as well as an interscalene block.  BLOOD LOSS:  Approximately 200 mL.  DRAINS:  None.  HISTORY:  Ms. Wickes is a 68 year old female, who has had multiple previous right shoulder surgeries and presents with increasing pain, functional limitations, profound loss of mobility with radiographic changes confirming a rotator cuff tear arthropathy.  Due to her ongoing pain and functional limitations, she was brought to the operating room, at this time complaining of right shoulder reversal arthroplasty as described below.  Preoperatively, I counseled Ms. Rafter regarding treatment options and risks versus benefits thereof.  Possible surgical complications were all reviewed.  The potential for bleeding, infection, neurovascular injury, persistent pain, loss of motion, anesthetic complication, failure of the implant, and possible need for additional surgery.  She understands and accepts and agrees with our planned procedure.  PROCEDURE IN DETAIL:  After undergoing routine preop evaluation, the patient received prophylactic antibiotics.  An interscalene block was established in the holding area by the Anesthesia Department.  Placed supine on the table  underwent smooth induction of the general endotracheal anesthesia.  Placed in a beach-chair position and appropriately padded and protected.  The right shoulder girdle region was sterilely prepped and draped in standard fashion.  Time-out was called.  An anterior approach to the right shoulder was made through a 10 cm deltopectoral incision.  Skin flaps were elevated.  Electrocautery was used for hemostasis.  The cephalic vein was identified and retracted laterally with the deltoid.  Pectoralis Major was retracted medially. The upper centimeter and half was tenotomized to enhance exposure. There was anterior superior humeral skateboard noted.  We carefully dissected free the conjoined tendon.  This was mobilized and retracted medially.  The remnants of the subscapularis was then divided away from the lesser tuberosity and tagged for later repair.  The remnant of the rotator cuff, which was quite attenuated and nonfunctional, was excised from the superior aspect of the humeral head.  The adhesions were divided from beneath the deltoid.  The capsule was then released anteroinferiorly and inferiorly allowing delivery of the head through the wound.  We then used a rongeur to open the apex of the humeral head and passed her hand reamers into the humeral canal and had to remove the previously placed metal suture anchors, which impeded passing of the reamers and ultimately we were able to ream up to size 8 with the canal being very tight distally.  At this point, we placed our intramedullary guide and then  at 0 degrees a retroversion performed a humeral head resection using an oscillating saw.  Osteophytes at the margin of the proximal humerus and then removed with rongeur.  Metal cap placed over the cut surface of the proximal humerus.  We then exposed the glenoid with combination of Fukuda, pitchfork, and snake tongue retractors.  We carefully released the capsular attachments at the margin of  the glenoid.  All soft tissue debris was removed, since __________ complete visualization of the margin was achieved somewhat and osteophytes of the large glenoid were removed with a rongeur.  A guidepin was then placed into the center of the glenoid at the appropriate level and then we reamed the glenoid to a subchondral bony bed and then the peripheral __________ reamer was used to clear the margins of the bone.  A central drill hole was then placed and the glenoid was irrigated.  We then impacted our glenoid base plate and then transfixed the glenoid base plate with a superior locking screw at the inferior, anterior, and posterior screws were all 18 mm length as these were all nonlocking screws, but all obtained good bony purchase, and then the locking screw was terminally tightened with excellent fit and fixation.  We then selected the 38 standard glenosphere and this was placed over the base plate and tightened, impacted and re-tightened with excellent fixation. We then returned our attention to the proximal humerus, where the size 8 stem was introduced and we selected the eccentric reaming option for size 1.  We then reamed the metaphyseal region and our trial implant was placed and +3 poly was good, soft tissue balance.  Excellent shoulder motion and stability.  Trials were removed.  The final implant was assembled and then impacted to appropriate depth at approximately 5 degrees of retroversion.  I then performed once again trial reductions and again the +3 poly showed the best fit and soft tissue balance.  The final +3 poly was impacted.  Final reduction was performed and again shoulder was taken through range of motion showing excellent stability. Good motion and good soft tissue balance.  At this point, the joint was copiously irrigated.  Hemostasis was obtained.  The subscapularis was then repaired back to the proximal humeral capsule region with #2 FiberWire through bone  tunnels.  The deltopectoral interval was then reapproximated with a series of #1 Vicryl figure-of-eight sutures, 2-0 Vicryl used to close the subcu layer and intracuticular 3-0 Monocryl for the skin followed by Dermabond.  Aquacel dressing was then applied.  The right arm was placed in a sling.  The patient was awakened, extubated, and taken to recovery room in stable condition.  Jenetta Loges, P.A.-C was used as an Environmental consultant throughout this case essential for help with positioning the patient, positioning of the extremity, management of the retractors, tissue manipulation, and implantation versus prosthesis, wound closer, and intraoperative decision making.     Metta Clines. , M.D.     KMS/MEDQ  D:  03/11/2014  T:  03/11/2014  Job:  [id number]

## 2014-03-12 ENCOUNTER — Encounter (HOSPITAL_COMMUNITY): Payer: Self-pay | Admitting: Orthopedic Surgery

## 2014-03-12 DIAGNOSIS — M12811 Other specific arthropathies, not elsewhere classified, right shoulder: Secondary | ICD-10-CM | POA: Diagnosis present

## 2014-03-12 DIAGNOSIS — M75101 Unspecified rotator cuff tear or rupture of right shoulder, not specified as traumatic: Secondary | ICD-10-CM | POA: Diagnosis present

## 2014-03-12 MED ORDER — OXYCODONE-ACETAMINOPHEN 5-325 MG PO TABS: 1.0000 | ORAL_TABLET | ORAL | Status: DC | PRN

## 2014-03-12 MED ORDER — DIAZEPAM 5 MG PO TABS
2.5000 mg | ORAL_TABLET | Freq: Four times a day (QID) | ORAL | Status: DC | PRN
Start: 2014-03-12 — End: 2014-05-05

## 2014-03-12 NOTE — Discharge Summary (Signed)
PATIENT ID:      Breanna Mason  MRN:     [id number] DOB/AGE:    Jan 10, 1946 / 68 y.o.     DISCHARGE SUMMARY  ADMISSION DATE:    03/11/2014 DISCHARGE DATE:    ADMISSION DIAGNOSIS: right shoulder cuff tear arthroplathy Past Medical History  Diagnosis Date  . Essential hypertension, benign   . GERD (gastroesophageal reflux disease)   . Asthma   . Mixed hyperlipidemia   . Impaired glucose tolerance   . Osteoporosis   . Anxiety   . Osteopenia   . Reflux   . IFG (impaired fasting glucose)   . Allergy   . PONV (postoperative nausea and vomiting)   . Pneumonia     hx  . Arthritis     DISCHARGE DIAGNOSIS:   Principal Problem:   Rotator cuff tear arthropathy of right shoulder Active Problems:   S/P shoulder replacement   PROCEDURE: Procedure(s): RIGHT REVERSE SHOULDER ARTHROPLASTY on 03/11/2014  CONSULTS:   none  HISTORY:  See H&P in chart.  HOSPITAL COURSE:  Breanna Mason is a 68 y.o. admitted on 03/11/2014 with a chief complaint of right shoulder pain and dysfunction with multiple failed rtc repair surgeries, and found to have a diagnosis of right shoulder cuff tear arthroplathy.  They were brought to the operating room on 03/11/2014 and underwent Procedure(s): RIGHT REVERSE SHOULDER ARTHROPLASTY.    They were given perioperative antibiotics: Anti-infectives    Start     Dose/Rate Route Frequency Ordered Stop   03/11/14 1630  ceFAZolin (ANCEF) IVPB 1 g/50 mL premix     1 g100 mL/hr over 30 Minutes Intravenous Every 6 hours 03/11/14 1551 03/12/14 0515   03/11/14 0600  ceFAZolin (ANCEF) IVPB 2 g/50 mL premix     2 g100 mL/hr over 30 Minutes Intravenous On call to O.R. 03/10/14 1346 03/11/14 1025    .  Patient underwent the above named procedure and tolerated it well. The following day they were hemodynamically stable and pain was controlled on oral analgesics. They were neurovascularly intact to the operative extremity. OT was ordered and worked with patient per  protocol. They were medically and orthopaedically stable for discharge on day 1. Home health was arranged.    DIAGNOSTIC STUDIES:  RECENT RADIOGRAPHIC STUDIES :  No results found.  RECENT VITAL SIGNS:  Patient Vitals for the past 24 hrs:  BP Temp Temp src Pulse Resp SpO2  03/12/14 0548 (!) 102/43 mmHg 98 F (36.7 C) Oral 73 16 96 %  03/11/14 2013 (!) 102/50 mmHg 98.1 F (36.7 C) - 80 16 98 %  03/11/14 1516 (!) 91/43 mmHg 98 F (36.7 C) - 72 16 98 %  03/11/14 1445 - 98.2 F (36.8 C) - 86 12 99 %  03/11/14 1430 (!) 103/57 mmHg - - 92 14 98 %  03/11/14 1415 - - - (!) 102 18 99 %  03/11/14 1400 127/63 mmHg - - 94 15 99 %  03/11/14 1345 - - - 92 15 99 %  03/11/14 1330 (!) 113/56 mmHg 98 F (36.7 C) - 96 15 98 %  03/11/14 1315 - - - 91 14 99 %  03/11/14 1311 119/60 mmHg - - 91 16 98 %  03/11/14 1300 - - - 88 16 98 %  03/11/14 1256 (!) 107/49 mmHg - - 86 15 98 %  03/11/14 1245 - - - 99 16 96 %  03/11/14 1242 (!) 123/59 mmHg - - (!) 101 16 96 %  03/11/14 1230 - - - 85 14 95 %  03/11/14 1222 (!) 143/70 mmHg 97.9 F (36.6 C) - 86 15 94 %  03/11/14 0955 (!) 134/56 mmHg - - 93 17 97 %  03/11/14 0950 124/65 mmHg - - 99 19 97 %  03/11/14 0945 (!) 132/59 mmHg - - 83 16 97 %  03/11/14 0940 131/77 mmHg - - 91 17 98 %  03/11/14 0935 127/82 mmHg - - 79 14 99 %  03/11/14 0930 (!) 131/92 mmHg - - 78 13 100 %  03/11/14 0925 (!) 147/76 mmHg - - 92 (!) 29 100 %  .  RECENT EKG RESULTS:    Orders placed or performed in visit on 05/01/13  . EKG 12-Lead    DISCHARGE INSTRUCTIONS:    DISCHARGE MEDICATIONS:     Medication List    STOP taking these medications        ALPRAZolam 0.5 MG tablet  Commonly known as:  XANAX      TAKE these medications        albuterol 108 (90 BASE) MCG/ACT inhaler  Commonly known as:  PROVENTIL HFA;VENTOLIN HFA  Inhale 2 puffs into the lungs every 6 (six) hours as needed for wheezing or shortness of breath.     alendronate 70 MG tablet  Commonly known  as:  FOSAMAX  TAKE ONE TABLET BY MOUTH ONCE A WEEK     budesonide-formoterol 160-4.5 MCG/ACT inhaler  Commonly known as:  SYMBICORT  Inhale 1 puff into the lungs 2 (two) times daily.     calcium-vitamin D 500-200 MG-UNIT per tablet  Commonly known as:  OSCAL WITH D  Take 1 tablet by mouth daily.     diazepam 5 MG tablet  Commonly known as:  VALIUM  Take 0.5-1 tablets (2.5-5 mg total) by mouth every 6 (six) hours as needed for muscle spasms or sedation.     docusate sodium 50 MG capsule  Commonly known as:  COLACE  Take 50 mg by mouth as needed for mild constipation. duralax     enalapril 10 MG tablet  Commonly known as:  VASOTEC  Take 1 tablet (10 mg total) by mouth at bedtime. TAKE ONE TABLET BY MOUTH EVERY DAY     meclizine 25 MG tablet  Commonly known as:  ANTIVERT  Take 1 tablet (25 mg total) by mouth as needed.     oxyCODONE-acetaminophen 5-325 MG per tablet  Commonly known as:  PERCOCET  Take 1-2 tablets by mouth every 4 (four) hours as needed.     pravastatin 40 MG tablet  Commonly known as:  PRAVACHOL  Take 1 tablet (40 mg total) by mouth at bedtime.        FOLLOW UP VISIT:       Follow-up Information    Follow up with Marin Shutter, MD.   Specialty:  Orthopedic Surgery   Why:  call to be seen in 10-14 days   Contact information:   [address] [place] [address] [phone]       DISCHARGE TO: Home  DISPOSITION: Good  DISCHARGE CONDITION:  Festus Barren for Dr. Justice Britain 03/12/2014, 8:40 AM

## 2014-03-12 NOTE — Evaluation (Signed)
Occupational Therapy Evaluation Patient Details Name: Breanna Mason MRN: [id number] DOB: 1945/07/28 Today's Date: 03/12/2014    History of Present Illness s/p reverse shoulder arthoplasty   Clinical Impression   Pt admitted with the above diagnoses and presents with below problem list. Pt will benefit from continued acute OT to address the below listed deficits and maximize independence with basic ADLs prior to d/c home with family. PTA pt was independent with ADLs. Pt currently needing min A for UB dressing, min guard for most remaining ADLs. ADL and sling education provided and reinforced with shoulder handout. Exercises completed as detailed below.      Follow Up Recommendations  Home health OT;Other (comment) (Supervision - OOB/mobility)    Equipment Recommendations  None recommended by OT    Recommendations for Other Services       Precautions / Restrictions Precautions Precautions: Shoulder Type of Shoulder Precautions: NWB, no AROM; Supple reverse shoulder protocal Shoulder Interventions: Shoulder sling/immobilizer;For comfort (for sleep) Precaution Booklet Issued: Yes (comment) Precaution Comments: reviewed precautions with pt and family Required Braces or Orthoses: Sling Restrictions Weight Bearing Restrictions: Yes RUE Weight Bearing: Non weight bearing      Mobility Bed Mobility Overal bed mobility: Needs Assistance Bed Mobility: Sit to Supine       Sit to supine: Supervision   General bed mobility comments: extra time, cues for technique  Transfers Overall transfer level: Needs assistance   Transfers: Sit to/from Stand Sit to Stand: Min guard         General transfer comment: min guard for balance    Balance Overall balance assessment: Needs assistance         Standing balance support: No upper extremity supported;During functional activity Standing balance-Leahy Scale: Good                              ADL Overall ADL's  : Needs assistance/impaired Eating/Feeding: Set up;Sitting   Grooming: Set up;Sitting;Standing   Upper Body Bathing: Min guard;Sitting   Lower Body Bathing: Min guard;Sit to/from stand   Upper Body Dressing : Sitting;Minimal assistance   Lower Body Dressing: Min guard;Sit to/from stand   Toilet Transfer: Ambulation;Comfort height toilet;Grab bars;Min guard   Toileting- Clothing Manipulation and Hygiene: Sit to/from stand;Min guard   Tub/ Banker: Ambulation;3 in 1;Min guard   Functional mobility during ADLs: Min guard General ADL Comments: Educated pt and family on techniques for safe completion of ADLs with shoulder precautions. Discussed safety with toilet and shower transfers and home setup.      Vision                     Perception     Praxis      Pertinent Vitals/Pain Pain Assessment: 0-10 Pain Score: 8  Pain Location: R shoulder, after exercises; mild pain at start of session  Pain Descriptors / Indicators: Aching;Sore Pain Intervention(s): Limited activity within patient's tolerance;Monitored during session;Repositioned;Patient requesting pain meds-RN notified;Utilized relaxation techniques;Ice applied     Hand Dominance Right   Extremity/Trunk Assessment Upper Extremity Assessment Upper Extremity Assessment: RUE deficits/detail RUE Deficits / Details: s/p R reverse TSA RUE: Unable to fully assess due to immobilization   Lower Extremity Assessment Lower Extremity Assessment: Overall WFL for tasks assessed       Communication Communication Communication: No difficulties   Cognition Arousal/Alertness: Awake/alert Behavior During Therapy: WFL for tasks assessed/performed Overall Cognitive Status: Within Functional Limits for tasks assessed  General Comments       Exercises Exercises: Shoulder     Shoulder Instructions Shoulder Instructions Donning/doffing shirt without moving shoulder:   (educated) Method for sponge bathing under operated UE:  (educated) Donning/doffing sling/immobilizer:  (educated) Correct positioning of sling/immobilizer:  (educated) Pendulum exercises (written home exercise program):  (educated) ROM for elbow, wrist and digits of operated UE: Supervision/safety Sling wearing schedule (on at all times/off for ADL's):  (educated) Proper positioning of operated UE when showering:  (educated) Positioning of UE while sleeping:  (educated)    Home Living Family/patient expects to be discharged to:: Private residence Living Arrangements: Spouse/significant other Available Help at Discharge: Family;Available 24 hours/day Type of Home: House Home Access: Level entry     Home Layout: One level     Bathroom Shower/Tub: Occupational psychologist: Standard     Home Equipment: Bedside commode;Grab bars - toilet          Prior Functioning/Environment Level of Independence: Independent             OT Diagnosis: Acute pain   OT Problem List: Impaired balance (sitting and/or standing);Decreased knowledge of use of DME or AE;Decreased knowledge of precautions;Impaired UE functional use;Pain   OT Treatment/Interventions: Self-care/ADL training;Therapeutic exercise;DME and/or AE instruction;Therapeutic activities;Patient/family education;Balance training    OT Goals(Current goals can be found in the care plan section) Acute Rehab OT Goals Patient Stated Goal: not stated OT Goal Formulation: With patient/family Time For Goal Achievement: 03/19/14 Potential to Achieve Goals: Good ADL Goals Pt Will Perform Upper Body Dressing: with modified independence;sitting Pt/caregiver will Perform Home Exercise Program: Right Upper extremity;With written HEP provided;With Supervision  OT Frequency: Min 3X/week   Barriers to D/C:            Co-evaluation              End of Session Equipment Utilized During Treatment: Other (comment)  (sling) Nurse Communication: Patient requests pain meds  Activity Tolerance: Patient limited by pain;Patient tolerated treatment well Patient left: in bed;with call bell/phone within reach;with family/visitor present   Time: 6433-2951 OT Time Calculation (min): 37 min Charges:  OT General Charges $OT Visit: 1 Procedure OT Evaluation $Initial OT Evaluation Tier I: 1 Procedure OT Treatments $Self Care/Home Management : 8-22 mins $Therapeutic Exercise: 8-22 mins G-Codes:    Hortencia Pilar April 10, 2014, 9:41 AM

## 2014-03-12 NOTE — Care Management Note (Signed)
CARE MANAGEMENT NOTE 03/12/2014  Patient:  Breanna Mason, Breanna Mason   Account Number:  [id number]  Date Initiated:  03/12/2014  Documentation initiated by:  Ricki Miller  Subjective/Objective Assessment:   68 yr old female admitted with right shoulder cuff tear arthroplasty. Patient had a right reverse shoulder arthroplasty.     Action/Plan:   Case manager spoke with patient and husband concerning home health needs. Choice offered. Referral called to Christa See, [facility] Liaison.   Anticipated DC Date:  03/12/2014   Anticipated DC Plan:  [place]  CM consult      [facility] Choice  HOME HEALTH   Choice offered to / List presented to:  C-1 Patient   DME arranged  NA        [place] arranged  HH-3 OT  HH-2 PT      [place]   Status of service:  Completed, signed off Medicare Important Message given?  NA - LOS <3 / Initial given by admissions (If response is "NO", the following Medicare IM given date fields will be blank) Date Medicare IM given:   Medicare IM given by:   Date Additional Medicare IM given:   Additional Medicare IM given by:    Discharge Disposition:  [place]  Per UR Regulation:  Reviewed for med. necessity/level of care/duration of stay

## 2014-03-12 NOTE — Discharge Summary (Signed)
Patient educated for discharge. Pt discharged to home via family.

## 2014-03-12 NOTE — Plan of Care (Signed)
Problem: Phase I Progression Outcomes Goal: Clear liquids, advance diet as tolerated Outcome: Completed/Met Date Met:  03/12/14

## 2014-03-12 NOTE — Plan of Care (Signed)
Problem: Consults Goal: Shoulder Surgery Patient Education See Patient Education Module for education specifics. Outcome: Completed/Met Date Met:  03/12/14 Goal: Diagnosis - Shoulder Surgery Reverse Total Shoulder Arthroplasty Right Goal: Skin Care Protocol Initiated - if Braden Score 18 or less If consults are not indicated, leave blank or document N/A Outcome: Completed/Met Date Met:  03/12/14 Goal: Nutrition Consult-if indicated Outcome: Completed/Met Date Met:  03/12/14 Goal: Diabetes Guidelines if Diabetic/Glucose > 140 If diabetic or lab glucose is > 140 mg/dl - Initiate Diabetes/Hyperglycemia Guidelines & Document Interventions  Outcome: Completed/Met Date Met:  03/12/14  Problem: Phase I Progression Outcomes Goal: OOB as tolerated unless otherwise ordered Outcome: Completed/Met Date Met:  03/12/14 Goal: Post op CNS neurovascular WDL Outcome: Completed/Met Date Met:  03/12/14 Goal: Voiding-avoid urinary catheter unless indicated Outcome: Completed/Met Date Met:  03/12/14

## 2014-03-13 DIAGNOSIS — J45909 Unspecified asthma, uncomplicated: Secondary | ICD-10-CM | POA: Diagnosis not present

## 2014-03-13 DIAGNOSIS — M810 Age-related osteoporosis without current pathological fracture: Secondary | ICD-10-CM | POA: Diagnosis not present

## 2014-03-13 DIAGNOSIS — Z471 Aftercare following joint replacement surgery: Secondary | ICD-10-CM | POA: Diagnosis not present

## 2014-03-13 DIAGNOSIS — M1990 Unspecified osteoarthritis, unspecified site: Secondary | ICD-10-CM | POA: Diagnosis not present

## 2014-03-13 DIAGNOSIS — I10 Essential (primary) hypertension: Secondary | ICD-10-CM | POA: Diagnosis not present

## 2014-03-13 DIAGNOSIS — M8580 Other specified disorders of bone density and structure, unspecified site: Secondary | ICD-10-CM | POA: Diagnosis not present

## 2014-03-29 ENCOUNTER — Other Ambulatory Visit: Payer: Self-pay | Admitting: Family Medicine

## 2014-03-29 DIAGNOSIS — Z96611 Presence of right artificial shoulder joint: Secondary | ICD-10-CM | POA: Diagnosis not present

## 2014-03-29 DIAGNOSIS — Z471 Aftercare following joint replacement surgery: Secondary | ICD-10-CM | POA: Diagnosis not present

## 2014-03-31 ENCOUNTER — Ambulatory Visit (HOSPITAL_COMMUNITY)
Admission: RE | Admit: 2014-03-31 | Discharge: 2014-03-31 | Disposition: A | Discharge status: Home / Self Care | Payer: Medicare Other | Source: Ambulatory Visit | Attending: Family Medicine | Admitting: Family Medicine

## 2014-03-31 DIAGNOSIS — M25611 Stiffness of right shoulder, not elsewhere classified: Secondary | ICD-10-CM | POA: Insufficient documentation

## 2014-03-31 DIAGNOSIS — Z471 Aftercare following joint replacement surgery: Secondary | ICD-10-CM | POA: Insufficient documentation

## 2014-03-31 DIAGNOSIS — Z96611 Presence of right artificial shoulder joint: Secondary | ICD-10-CM | POA: Diagnosis not present

## 2014-03-31 DIAGNOSIS — R531 Weakness: Secondary | ICD-10-CM | POA: Diagnosis not present

## 2014-03-31 NOTE — Therapy (Signed)
Occupational Therapy Evaluation  Patient Details  Name: Breanna Mason MRN: [id number] Date of Birth: 12/28/1945  Encounter Date: 03/31/2014      OT End of Session - 03/31/14 1837    Visit Number 1   Number of Visits 24   Date for OT Re-Evaluation 04/28/14   Authorization Type Medicare Part A   Authorization Time Period Before 10th visit   Authorization - Visit Number 1   Authorization - Number of Visits 10   OT Start Time 6568   OT Stop Time 1805   OT Time Calculation (min) 32 min   Activity Tolerance Patient tolerated treatment well   Behavior During Therapy [facility] for tasks assessed/performed      Past Medical History  Diagnosis Date  . Essential hypertension, benign   . GERD (gastroesophageal reflux disease)   . Asthma   . Mixed hyperlipidemia   . Impaired glucose tolerance   . Osteoporosis   . Anxiety   . Osteopenia   . Reflux   . IFG (impaired fasting glucose)   . Allergy   . PONV (postoperative nausea and vomiting)   . Pneumonia     hx  . Arthritis     Past Surgical History  Procedure Laterality Date  . Cesarean section    . Bilateral tubal ligation    . Shoulder surgery Right     x4  . Colonoscopy N/A 09/17/2013    Procedure: COLONOSCOPY;  Surgeon: Rogene Houston, MD;  [facility];  Service: Endoscopy;  Laterality: N/A;  1200  . Tubal ligation    . Carpal tunnel release Right     30 yrs  . Reverse shoulder arthroplasty Right 03/11/2014    Procedure: RIGHT REVERSE SHOULDER ARTHROPLASTY;  Surgeon: Marin Shutter, MD;  Location: [place];  Service: Orthopedics;  Laterality: Right;    There were no vitals taken for this visit.  Visit Diagnosis:  Weakness generalized  S/p reverse total shoulder arthroplasty, right  Decreased range of motion of right shoulder      Subjective Assessment - 03/31/14 1738    Symptoms S: I'll be happy when I can wash my hair again.   Pertinent History Patient is a 68 y/o female s/p right shoulder reverse  total replacement. Surgery was performed 03/11/14 by Dr. Onnie Graham. Patient states that this is her 5th surgery on her right shoulder. Patient states she is to wear the sling for 1 more week before removing. Dr. Onnie Graham referred patient to occupational therapy for evaluation and treatment.    Limitations No protocol was on OT order.    Special Tests FOTO 44/100   Patient Stated Goals To be able to wash her hair.   Currently in Pain? No/denies          [facility] OT Assessment - 03/31/14 1739    Assessment   Diagnosis s/p right shoulder joint reverse joint replacement   Onset Date 03/11/14   Prior Therapy Home health and Outpatient for 15 months   Precautions   Precautions Shoulder   Type of Shoulder Precautions PROM, progress to AAROM then AROM.    Required Braces or Orthoses Sling   Balance Screen   Has the patient fallen in the past 6 months No   Has the patient had a decrease in activity level because of a fear of falling?  No   Home  Environment   Family/patient expects to be discharged to: Private residence   Living Arrangements Spouse/significant other   Prior  Function   Level of Independence Independent with basic ADLs;Independent with gait   Vocation Retired   ADL   ADL comments Difficulty donning/doffing shirt, geting dressed, washing hair, wiping after the bathroom, reaching up ahead.   Mobility   Mobility Status Independent   Written Expression   Dominant Hand Right   Vision - History   Baseline Vision Wears glasses all the time   Cognition   Overall Cognitive Status Within Functional Limits for tasks assessed   AROM   Overall AROM Comments assessed supine. IR/ER adducted   Right Shoulder Flexion 109 Degrees   Right Shoulder ABduction 65 Degrees   Right Shoulder Internal Rotation 90 Degrees   Right Shoulder External Rotation -15 Degrees   PROM   Overall PROM Comments Assessed supine. IR/ER adducted   Right Shoulder Flexion 121 Degrees   Right Shoulder ABduction 83  Degrees   Right Shoulder Internal Rotation 90 Degrees   Right Shoulder External Rotation 10 Degrees   Strength   Overall Strength Comments Strength not assessed at this time due to protocol.            OT Education - 03/31/14 1837    Education provided Yes   Education Details Table slides   Person(s) Educated Patient   Methods Explanation;Demonstration;Handout   Comprehension Verbalized understanding          OT Short Term Goals - 03/31/14 1841    OT SHORT TERM GOAL #1   Title Patient will be educated on HEP.   Time 4   Period Weeks   Status New   OT SHORT TERM GOAL #2   Title Patient will increase PROM of right shoulder to [facility] to increase ability to perform dressing tasks with less difficulty.   Time 4   Period Weeks   Status New   OT SHORT TERM GOAL #3   Title Patient will report a pain level of 1/10 or less with daily tasks.   Time 4   Period Weeks   Status New   OT SHORT TERM GOAL #4   Title Patient will increase right shoulder strength to 3/5 to increase ability to wash hair.    OT SHORT TERM GOAL #5   Title Patient will decrease fascial restrictions from max to mod amount.   Time 4   Period Weeks   Status New          OT Long Term Goals - 03/31/14 1845    OT LONG TERM GOAL #1   Title Patient will return to highest level of independence with BADL and leisure tasks.    Time 12   Period Weeks   Status New   OT LONG TERM GOAL #2   Title Patient will increase AROM of right shoulder to [facility] to increase ability reach into overhead cabinets with less difficulty.   Time 12   Period Weeks   Status New   OT LONG TERM GOAL #3   Title Patient will increase right shoulder strength 4/5 to increase ability to complete daily tasks with less difficulty.    OT LONG TERM GOAL #4   Title Patient will decrease fascial restrictions from mod to min amount.   Time 12   Period Weeks   Status New          Plan - 03/31/14 1838    Clinical Impression Statement A:  Patient is a 68 y/o female s/p right reverse shoulder total replacement causing decreased ROM and strength and increased fascial restrictions  and pain resulting in difficulty completing BADL tasks.    Rehab Potential Excellent   OT Frequency 2x / week   OT Duration 12 weeks   OT Treatment/Interventions Self-care/ADL training;Therapeutic exercise;Patient/family education;Therapeutic activities;Electrical Stimulation;Moist Heat;Cryotherapy;Passive range of motion;Scar mobilization   Plan P: Pt will benefit from skilled OT interventions to decrease pain, increase ROM, increase strength, and increase overall RUE functional use. Treatment Plan: PROM, AAROM, and AROM, MFR and manual stretching, scapular strengthening and proximal stabilization, RUE general strengthening.[name] Kitchen Next Treatment session: give pendulum exercises for HEP.   OT Home Exercise Plan Table slides   Consulted and Agree with Plan of Care Patient          G-Codes - 04-08-2014 1849    Functional Assessment Tool Used FOTO score: 44/100 (56% impaired)   Functional Limitation Carrying, moving and handling objects   Carrying, Moving and Handling Objects Current Status (B5830) At least 40 percent but less than 60 percent impaired, limited or restricted   Carrying, Moving and Handling Objects Goal Status (N4076) At least 20 percent but less than 40 percent impaired, limited or restricted      Problem List Patient Active Problem List   Diagnosis Date Noted  . Rotator cuff tear arthropathy of right shoulder 03/12/2014  . S/P shoulder replacement 03/11/2014  . Chest discomfort 04/20/2013  . Esophageal reflux 09/07/2012  . Essential hypertension, benign 03/06/2011  . Mixed hyperlipidemia 03/06/2011  . Impaired glucose tolerance 03/06/2011  . Asthma 03/06/2011       Ailene Ravel, OTR/L,CBIS  [phone]  2014/04/08, 6:52 PM

## 2014-03-31 NOTE — Patient Instructions (Signed)
SHOULDER: Flexion On Table   Place hands on table, elbows straight. Move hips away from body. Press hands down into table. Hold __5_ seconds. __10_ reps per set, __2_ sets per day, ___7 days per week  Abduction (Passive)   With arm out to side, resting on table, lower head toward arm, keeping trunk away from table. Hold _5___ seconds. Repeat __10__ times. Do _2___ sessions per day.  Copyright  VHI. All rights reserved.     Internal Rotation (Assistive)   Seated with elbow bent at right angle and held against side, slide arm on table surface in an inward arc. Repeat __10__ times. Do __2__ sessions per day. Activity: Use this motion to brush crumbs off the table.  Copyright  VHI. All rights reserved.

## 2014-04-04 ENCOUNTER — Other Ambulatory Visit: Payer: Self-pay | Admitting: Family Medicine

## 2014-04-05 MED ORDER — MECLIZINE HCL 25 MG PO TABS: 25.0000 mg | ORAL_TABLET | Freq: Every day | ORAL | Status: DC | PRN

## 2014-04-06 ENCOUNTER — Ambulatory Visit (HOSPITAL_COMMUNITY)
Admission: RE | Admit: 2014-04-06 | Discharge: 2014-04-06 | Disposition: A | Discharge status: Home / Self Care | Payer: Medicare Other | Source: Ambulatory Visit | Attending: Family Medicine | Admitting: Family Medicine

## 2014-04-06 DIAGNOSIS — M25611 Stiffness of right shoulder, not elsewhere classified: Secondary | ICD-10-CM | POA: Diagnosis not present

## 2014-04-06 DIAGNOSIS — Z96611 Presence of right artificial shoulder joint: Secondary | ICD-10-CM | POA: Insufficient documentation

## 2014-04-06 DIAGNOSIS — Z471 Aftercare following joint replacement surgery: Secondary | ICD-10-CM | POA: Diagnosis not present

## 2014-04-06 DIAGNOSIS — R531 Weakness: Secondary | ICD-10-CM | POA: Insufficient documentation

## 2014-04-06 NOTE — Therapy (Signed)
Occupational Therapy Treatment  Patient Details  Name: Breanna Mason MRN: [id number] Date of Birth: May 20, 1945  Encounter Date: 04/06/2014      OT End of Session - 04/06/14 1147    Visit Number 2   Number of Visits 24   Date for OT Re-Evaluation 04/28/14   Authorization Type Medicare Part A   Authorization Time Period Before 10th visit   Authorization - Visit Number 2   Authorization - Number of Visits 10   OT Start Time 1105   OT Stop Time 1144   OT Time Calculation (min) 39 min   Activity Tolerance Patient tolerated treatment well   Behavior During Therapy [facility] for tasks assessed/performed      Past Medical History  Diagnosis Date  . Essential hypertension, benign   . GERD (gastroesophageal reflux disease)   . Asthma   . Mixed hyperlipidemia   . Impaired glucose tolerance   . Osteoporosis   . Anxiety   . Osteopenia   . Reflux   . IFG (impaired fasting glucose)   . Allergy   . PONV (postoperative nausea and vomiting)   . Pneumonia     hx  . Arthritis     Past Surgical History  Procedure Laterality Date  . Cesarean section    . Bilateral tubal ligation    . Shoulder surgery Right     x4  . Colonoscopy N/A 09/17/2013    Procedure: COLONOSCOPY;  Surgeon: Rogene Houston, MD;  [facility];  Service: Endoscopy;  Laterality: N/A;  1200  . Tubal ligation    . Carpal tunnel release Right     30 yrs  . Reverse shoulder arthroplasty Right 03/11/2014    Procedure: RIGHT REVERSE SHOULDER ARTHROPLASTY;  Surgeon: Marin Shutter, MD;  Location: [place];  Service: Orthopedics;  Laterality: Right;    There were no vitals taken for this visit.  Visit Diagnosis:  Weakness generalized  S/p reverse total shoulder arthroplasty, right  Decreased range of motion of right shoulder      Subjective Assessment - 04/06/14 1103    Symptoms "its just so sore today. I put ice and heat on it last night."   Limitations No protocol was on OT order.    Currently in  Pain? Yes   Pain Score 8    Pain Location Shoulder   Pain Orientation Right   Pain Descriptors / Indicators Sore            OT Treatments/Exercises (OP) - 04/06/14 1106    Shoulder Exercises: Supine   Protraction PROM;10 reps   Horizontal ABduction PROM;10 reps   External Rotation PROM;10 reps   Internal Rotation PROM;10 reps   Flexion PROM;10 reps   ABduction PROM;10 reps   Shoulder Exercises: Seated   Elevation AROM;12 reps   Extension AROM;12 reps   Row AROM;12 reps   Shoulder Exercises: Therapy Ball   Flexion 15 reps   ABduction 15 reps   Shoulder Exercises: ROM/Strengthening   Thumb Tacks 1 min   Prot/Ret//Elev/Dep 1 min   Manual Therapy   Manual Therapy Myofascial release            OT Short Term Goals - 04/06/14 1154    OT SHORT TERM GOAL #1   Title Patient will be educated on HEP.   Status On-going   OT SHORT TERM GOAL #2   Title Patient will increase PROM of right shoulder to [facility] to increase ability to perform dressing tasks with  less difficulty.   Status On-going   OT SHORT TERM GOAL #3   Title Patient will report a pain level of 1/10 or less with daily tasks.   Status On-going   OT SHORT TERM GOAL #4   Title Patient will increase right shoulder strength to 3/5 to increase ability to wash hair.    Status On-going   OT SHORT TERM GOAL #5   Title Patient will decrease fascial restrictions from max to mod amount.   Status On-going          OT Long Term Goals - 04/06/14 1154    OT LONG TERM GOAL #1   Title Patient will return to highest level of independence with BADL and leisure tasks.    Status On-going   OT LONG TERM GOAL #2   Title Patient will increase AROM of right shoulder to [facility] to increase ability reach into overhead cabinets with less difficulty.   Status On-going   OT LONG TERM GOAL #3   Title Patient will increase right shoulder strength 4/5 to increase ability to complete daily tasks with less difficulty.    Status On-going   OT  LONG TERM GOAL #4   Title Patient will decrease fascial restrictions from mod to min amount.   Status On-going          Plan - 04/06/14 1148    Clinical Impression Statement Initiated MFR and PROM this session. Pt tolerated well with good ROM and only slight increased pain. Pt tolerated well seated elevation, extension, row; ball stretches; and standing pro/ret/elev/dep and thumbtacks.  Pt indicated she will be sore this eveing, but had good otlerance of all exercises.  Pt verbalized that she has been completing AAROM dowel exercises with home health therapist prior to beginning outpatient therapies.  With pt's range and tolerance of PROM this session, pt will likely tolerate AAROM initiation next session.     Plan Add supine AAROM exercises.        Problem List Patient Active Problem List   Diagnosis Date Noted  . Rotator cuff tear arthropathy of right shoulder 03/12/2014  . S/P shoulder replacement 03/11/2014  . Chest discomfort 04/20/2013  . Esophageal reflux 09/07/2012  . Essential hypertension, benign 03/06/2011  . Mixed hyperlipidemia 03/06/2011  . Impaired glucose tolerance 03/06/2011  . Asthma 03/06/2011    Bea Graff , MS, OTR/L [facility] [phone] 04/06/2014, 11:55 AM

## 2014-04-08 ENCOUNTER — Encounter (HOSPITAL_COMMUNITY): Payer: Self-pay

## 2014-04-08 ENCOUNTER — Ambulatory Visit (HOSPITAL_COMMUNITY)
Admission: RE | Admit: 2014-04-08 | Discharge: 2014-04-08 | Disposition: A | Discharge status: Home / Self Care | Payer: Medicare Other | Source: Ambulatory Visit | Attending: Family Medicine | Admitting: Family Medicine

## 2014-04-08 DIAGNOSIS — M25611 Stiffness of right shoulder, not elsewhere classified: Secondary | ICD-10-CM

## 2014-04-08 DIAGNOSIS — Z471 Aftercare following joint replacement surgery: Secondary | ICD-10-CM | POA: Diagnosis not present

## 2014-04-08 DIAGNOSIS — Z96611 Presence of right artificial shoulder joint: Secondary | ICD-10-CM

## 2014-04-08 DIAGNOSIS — R531 Weakness: Secondary | ICD-10-CM

## 2014-04-08 NOTE — Therapy (Signed)
[facility] [address] [place], [place], [address] Phone: [phone]   Fax:  [phone]  Occupational Therapy Treatment  Patient Details  Name: Breanna Mason MRN: [id number] Date of Birth: 30-Apr-1946  Encounter Date: 04/08/2014      OT End of Session - 04/08/14 1159    Visit Number 3   Number of Visits 24   Date for OT Re-Evaluation 04/28/14   Authorization Type Medicare Part A   Authorization Time Period Before 10th visit   Authorization - Visit Number 3   Authorization - Number of Visits 10   OT Start Time 1106   OT Stop Time 1147   OT Time Calculation (min) 41 min   Activity Tolerance Patient tolerated treatment well   Behavior During Therapy [facility] for tasks assessed/performed      Past Medical History  Diagnosis Date  . Essential hypertension, benign   . GERD (gastroesophageal reflux disease)   . Asthma   . Mixed hyperlipidemia   . Impaired glucose tolerance   . Osteoporosis   . Anxiety   . Osteopenia   . Reflux   . IFG (impaired fasting glucose)   . Allergy   . PONV (postoperative nausea and vomiting)   . Pneumonia     hx  . Arthritis     Past Surgical History  Procedure Laterality Date  . Cesarean section    . Bilateral tubal ligation    . Shoulder surgery Right     x4  . Colonoscopy N/A 09/17/2013    Procedure: COLONOSCOPY;  Surgeon: Rogene Houston, MD;  [facility];  Service: Endoscopy;  Laterality: N/A;  1200  . Tubal ligation    . Carpal tunnel release Right     30 yrs  . Reverse shoulder arthroplasty Right 03/11/2014    Procedure: RIGHT REVERSE SHOULDER ARTHROPLASTY;  Surgeon: Marin Shutter, MD;  Location: [place];  Service: Orthopedics;  Laterality: Right;    There were no vitals taken for this visit.  Visit Diagnosis:  S/p reverse total shoulder arthroplasty, right  Decreased range of motion of right shoulder  Weakness generalized      Subjective Assessment - 04/08/14 1154    Symptoms  "i didn't hurt after last time, but I do think i slept on it funny last night."   Limitations Supple Protocol: weeks 4-8 (12/3 - 12/31): AAROM and AROM in all planes - do not aggressively push ROM!  Weeks 8-12 (12/31- 1/28) PROM and AROM to tolerance, sidelying, gradual resistance   Currently in Pain? Yes   Pain Score 3    Pain Location Shoulder   Pain Orientation Right   Pain Descriptors / Indicators Sore   Pain Type Acute pain            OT Treatments/Exercises (OP) - 04/08/14 1109    Shoulder Exercises: Supine   Protraction PROM;AAROM;10 reps   Horizontal ABduction PROM;AAROM;10 reps   External Rotation PROM;AAROM;10 reps   Internal Rotation PROM;AAROM;10 reps   Flexion PROM;AAROM;10 reps   ABduction PROM;AAROM;10 reps   Shoulder Exercises: Seated   Elevation AROM;12 reps   Extension AROM;12 reps   Row AROM;12 reps   Shoulder Exercises: Therapy Ball   Flexion 15 reps   ABduction 15 reps   Shoulder Exercises: ROM/Strengthening   Thumb Tacks 1 min   Prot/Ret//Elev/Dep 1 min   Manual Therapy   Manual Therapy Myofascial release   Myofascial Release MFR and manual stretching to RUE bicpe, upper  arm, trap, and scap regions to decrease fascial restrictions and promote deccreased pain.            OT Short Term Goals - 04/08/14 1200    OT SHORT TERM GOAL #1   Title Patient will be educated on HEP.   Status On-going   OT SHORT TERM GOAL #2   Title Patient will increase PROM of right shoulder to [facility] to increase ability to perform dressing tasks with less difficulty.   Status On-going   OT SHORT TERM GOAL #3   Title Patient will report a pain level of 1/10 or less with daily tasks.   Status On-going   OT SHORT TERM GOAL #4   Title Patient will increase right shoulder strength to 3/5 to increase ability to wash hair.    Status On-going   OT SHORT TERM GOAL #5   Title Patient will decrease fascial restrictions from max to mod amount.   Status On-going          OT  Long Term Goals - 04/08/14 1200    OT LONG TERM GOAL #1   Title Patient will return to highest level of independence with BADL and leisure tasks.    Status On-going   OT LONG TERM GOAL #2   Title Patient will increase AROM of right shoulder to [facility] to increase ability reach into overhead cabinets with less difficulty.   Status On-going   OT LONG TERM GOAL #3   Title Patient will increase right shoulder strength 4/5 to increase ability to complete daily tasks with less difficulty.    Status On-going   OT LONG TERM GOAL #4   Title Patient will decrease fascial restrictions from mod to min amount.   Status On-going          Plan - 04/08/14 1159    Clinical Impression Statement Pt brought protoc from MD supple this session - will be scanned into chart.  progressed to AAROM supine exercises this session.  Pt had very good tolerance. pt ahd overall decreased pain this session from previous session.  Tolerated continued ball stretches. Continues to need cueing for pro/ret/elev/dep and thumbtacks.     Plan Continue supine AAROM. Add pulleys.        Problem List Patient Active Problem List   Diagnosis Date Noted  . Rotator cuff tear arthropathy of right shoulder 03/12/2014  . S/P shoulder replacement 03/11/2014  . Chest discomfort 04/20/2013  . Esophageal reflux 09/07/2012  . Essential hypertension, benign 03/06/2011  . Mixed hyperlipidemia 03/06/2011  . Impaired glucose tolerance 03/06/2011  . Asthma 03/06/2011   Bea Graff , MS, OTR/L [place] [phone] 04/08/2014, 12:02 PM

## 2014-04-10 ENCOUNTER — Emergency Department (HOSPITAL_COMMUNITY)
Admission: EM | Admit: 2014-04-10 | Discharge: 2014-04-10 | Disposition: A | Discharge status: Home / Self Care | Payer: Medicare Other | Attending: Emergency Medicine | Admitting: Emergency Medicine

## 2014-04-10 ENCOUNTER — Encounter (HOSPITAL_COMMUNITY): Payer: Self-pay | Admitting: Emergency Medicine

## 2014-04-10 DIAGNOSIS — Y929 Unspecified place or not applicable: Secondary | ICD-10-CM | POA: Diagnosis not present

## 2014-04-10 DIAGNOSIS — Y9389 Activity, other specified: Secondary | ICD-10-CM | POA: Insufficient documentation

## 2014-04-10 DIAGNOSIS — Z79899 Other long term (current) drug therapy: Secondary | ICD-10-CM | POA: Insufficient documentation

## 2014-04-10 DIAGNOSIS — K219 Gastro-esophageal reflux disease without esophagitis: Secondary | ICD-10-CM | POA: Insufficient documentation

## 2014-04-10 DIAGNOSIS — W540XXA Bitten by dog, initial encounter: Secondary | ICD-10-CM | POA: Diagnosis not present

## 2014-04-10 DIAGNOSIS — I10 Essential (primary) hypertension: Secondary | ICD-10-CM | POA: Diagnosis not present

## 2014-04-10 DIAGNOSIS — J45909 Unspecified asthma, uncomplicated: Secondary | ICD-10-CM | POA: Diagnosis not present

## 2014-04-10 DIAGNOSIS — M1990 Unspecified osteoarthritis, unspecified site: Secondary | ICD-10-CM | POA: Insufficient documentation

## 2014-04-10 DIAGNOSIS — Z880 Allergy status to penicillin: Secondary | ICD-10-CM | POA: Insufficient documentation

## 2014-04-10 DIAGNOSIS — S91351A Open bite, right foot, initial encounter: Secondary | ICD-10-CM | POA: Insufficient documentation

## 2014-04-10 DIAGNOSIS — Y998 Other external cause status: Secondary | ICD-10-CM | POA: Diagnosis not present

## 2014-04-10 DIAGNOSIS — E782 Mixed hyperlipidemia: Secondary | ICD-10-CM | POA: Insufficient documentation

## 2014-04-10 MED ORDER — LIDOCAINE HCL (PF) 1 % IJ SOLN
INTRAMUSCULAR | Status: AC
  Administered 2014-04-10: 5 mL
  Filled 2014-04-10: qty 5

## 2014-04-10 MED ORDER — POVIDONE-IODINE 10 % EX SOLN
CUTANEOUS | Status: AC
  Filled 2014-04-10: qty 118

## 2014-04-10 MED ORDER — CLINDAMYCIN HCL 150 MG PO CAPS: 150.0000 mg | ORAL_CAPSULE | Freq: Four times a day (QID) | ORAL | Status: DC

## 2014-04-10 NOTE — Discharge Instructions (Signed)
Please watch your wound to look for signs of infection including redness, swelling or worsening pain.  Take all antibiotics.  Return if wound appears infected.  Otherwise recheck with Dr. Wolfgang Phoenix on Monday or Tuesday.  Sutures should be removed in 8 days.   Animal Bite An animal bite can result in a scratch on the skin, deep open cut, puncture of the skin, crush injury, or tearing away of the skin or a body part. Dogs are responsible for most animal bites. Children are bitten more often than adults. An animal bite can range from very mild to more serious. A small bite from your house pet is no cause for alarm. However, some animal bites can become infected or injure a bone or other tissue. You must seek medical care if:  The skin is broken and bleeding does not slow down or stop after 15 minutes.  The puncture is deep and difficult to clean (such as a cat bite).  Pain, warmth, redness, or pus develops around the wound.  The bite is from a stray animal or rodent. There may be a risk of rabies infection.  The bite is from a snake, raccoon, skunk, fox, coyote, or bat. There may be a risk of rabies infection.  The person bitten has a chronic illness such as diabetes, liver disease, or cancer, or the person takes medicine that lowers the immune system.  There is concern about the location and severity of the bite. It is important to clean and protect an animal bite wound right away to prevent infection. Follow these steps:  Clean the wound with plenty of water and soap.  Apply an antibiotic cream.  Apply gentle pressure over the wound with a clean towel or gauze to slow or stop bleeding.  Elevate the affected area above the heart to help stop any bleeding.  Seek medical care. Getting medical care within 8 hours of the animal bite leads to the best possible outcome. DIAGNOSIS  Your caregiver will most likely:  Take a detailed history of the animal and the bite injury.  Perform a wound  exam.  Take your medical history. Blood tests or X-rays may be performed. Sometimes, infected bite wounds are cultured and sent to a lab to identify the infectious bacteria.  TREATMENT  Medical treatment will depend on the location and type of animal bite as well as the patient's medical history. Treatment may include:  Wound care, such as cleaning and flushing the wound with saline solution, bandaging, and elevating the affected area.  Antibiotics.  Tetanus immunization.  Rabies immunization.  Leaving the wound open to heal. This is often done with animal bites, due to the high risk of infection. However, in certain cases, wound closure with stitches, wound adhesive, skin adhesive strips, or staples may be used. Infected bites that are left untreated may require intravenous (IV) antibiotics and surgical treatment in the hospital. [place]  Follow your caregiver's instructions for wound care.  Take all medicines as directed.  If your caregiver prescribes antibiotics, take them as directed. Finish them even if you start to feel better.  Follow up with your caregiver for further exams or immunizations as directed. You may need a tetanus shot if:  You cannot remember when you had your last tetanus shot.  You have never had a tetanus shot.  The injury broke your skin. If you get a tetanus shot, your arm may swell, get red, and feel warm to the touch. This is common and  not a problem. If you need a tetanus shot and you choose not to have one, there is a rare chance of getting tetanus. Sickness from tetanus can be serious. SEEK MEDICAL CARE IF:  You notice warmth, redness, soreness, swelling, pus discharge, or a bad smell coming from the wound.  You have a red line on the skin coming from the wound.  You have a fever, chills, or a general ill feeling.  You have nausea or vomiting.  You have continued or worsening pain.  You have trouble moving the injured  part.  You have other questions or concerns. MAKE SURE YOU:  Understand these instructions.  Will watch your condition.  Will get help right away if you are not doing well or get worse. Document Released: 01/09/2011 Document Revised: 07/16/2011 Document Reviewed: 01/09/2011 [facility] Patient Information 2015 [place], [place]. This information is not intended to replace advice given to you by your health care provider. Make sure you discuss any questions you have with your health care provider.

## 2014-04-10 NOTE — ED Notes (Signed)
Patient states she was bitten by a dog in the right foot today. Patient has bandage over wound on right foot. Bleeding controlled at this time. Family member states owner is looking for rabies paperwork to verify dog is up-to-date on shot. Police department notified.

## 2014-04-10 NOTE — ED Provider Notes (Signed)
CSN: [id number]     Arrival date & time 04/10/14  1223 History  This chart was scribed for Breanna Pollack, MD by Tula Nakayama, ED Scribe. This patient was seen in room APA05/APA05 and the patient's care was started at 1:03 PM.     Chief Complaint  Patient presents with  . Animal Bite    Patient is a 68 y.o. female presenting with animal bite. The history is provided by the patient. No language interpreter was used.  Animal Bite Contact animal:  Dog Location:  Foot Foot injury location:  R foot and top of R foot Time since incident:  1 hour Incident location:  Another residence Notifications:  Personal assistant and law enforcement Animal's rabies vaccination status:  Up to date Tetanus status:  Up to date Relieved by:  None tried Worsened by:  Nothing tried Ineffective treatments:  None tried Associated symptoms: no fever, no numbness and no swelling    HPI Comments: Breanna Mason is a 68 y.o. female who presents to the Emergency Department complaining of a dog bite to her right foot that occurred 1 hour ago. The dog belongs to a relative who states that it is up to date on shots. Pt notes bite occurred when she was walking up to her nephew's house. She states that she has received a Tetanus shot in the last 1-2 years. Pt denies history of DM, anti-coagulants, and any other injuries.  Past Medical History  Diagnosis Date  . Essential hypertension, benign   . GERD (gastroesophageal reflux disease)   . Asthma   . Mixed hyperlipidemia   . Impaired glucose tolerance   . Osteoporosis   . Anxiety   . Osteopenia   . Reflux   . IFG (impaired fasting glucose)   . Allergy   . PONV (postoperative nausea and vomiting)   . Pneumonia     hx  . Arthritis    Past Surgical History  Procedure Laterality Date  . Cesarean section    . Bilateral tubal ligation    . Shoulder surgery Right     x4  . Colonoscopy N/A 09/17/2013    Procedure: COLONOSCOPY;  Surgeon: Rogene Houston, MD;   [facility];  Service: Endoscopy;  Laterality: N/A;  1200  . Tubal ligation    . Carpal tunnel release Right     30 yrs  . Reverse shoulder arthroplasty Right 03/11/2014    Procedure: RIGHT REVERSE SHOULDER ARTHROPLASTY;  Surgeon: Marin Shutter, MD;  Location: [place];  Service: Orthopedics;  Laterality: Right;   Family History  Problem Relation Age of Onset  . Hypertension Father   . Diabetes type II Father    History  Substance Use Topics  . Smoking status: Never Smoker   . Smokeless tobacco: Never Used  . Alcohol Use: No   OB History    No data available     Review of Systems  Constitutional: Negative for fever.  Skin: Positive for wound.  Neurological: Negative for numbness.  All other systems reviewed and are negative.  Allergies  Penicillins  Home Medications   Prior to Admission medications   Medication Sig Start Date End Date Taking? Authorizing Provider  albuterol (PROVENTIL HFA;VENTOLIN HFA) 108 (90 BASE) MCG/ACT inhaler Inhale 2 puffs into the lungs every 6 (six) hours as needed for wheezing or shortness of breath. 04/15/13  Yes Mikey Kirschner, MD  ALPRAZolam Duanne Moron) 0.5 MG tablet Take 0.5 mg by mouth 2 (two) times  daily as needed for anxiety.   Yes Historical Provider, MD  budesonide-formoterol (SYMBICORT) 160-4.5 MCG/ACT inhaler Inhale 1 puff into the lungs 2 (two) times daily. 02/23/14  Yes Mikey Kirschner, MD  calcium-vitamin D (OSCAL WITH D) 500-200 MG-UNIT per tablet Take 1 tablet by mouth daily.     Yes Historical Provider, MD  docusate sodium (COLACE) 50 MG capsule Take 50 mg by mouth as needed for mild constipation. duralax   Yes Historical Provider, MD  enalapril (VASOTEC) 10 MG tablet Take 1 tablet (10 mg total) by mouth at bedtime. TAKE ONE TABLET BY MOUTH EVERY DAY 02/23/14  Yes Mikey Kirschner, MD  meclizine (ANTIVERT) 25 MG tablet Take 1 tablet (25 mg total) by mouth daily as needed for dizziness. 04/05/14  Yes Mikey Kirschner, MD   oxyCODONE-acetaminophen (PERCOCET) 5-325 MG per tablet Take 1-2 tablets by mouth every 4 (four) hours as needed. 03/12/14  Yes Tracy Shuford, PA-C  pravastatin (PRAVACHOL) 40 MG tablet Take 1 tablet (40 mg total) by mouth at bedtime. 02/23/14  Yes Mikey Kirschner, MD  alendronate (FOSAMAX) 70 MG tablet TAKE ONE TABLET BY MOUTH ONCE A WEEK 02/23/14   Mikey Kirschner, MD  diazepam (VALIUM) 5 MG tablet Take 0.5-1 tablets (2.5-5 mg total) by mouth every 6 (six) hours as needed for muscle spasms or sedation. Patient not taking: Reported on 04/10/2014 03/12/14   Olivia Mackie Shuford, PA-C   BP 153/82 mmHg  Pulse 117  Temp(Src) 98.9 F (37.2 C) (Oral)  Resp 18  Ht 5\' 3"  (1.6 m)  Wt 149 lb (67.586 kg)  BMI 26.40 kg/m2  SpO2 95% Physical Exam  Constitutional: She is oriented to person, place, and time. She appears well-developed and well-nourished.  HENT:  Head: Normocephalic and atraumatic.  Right Ear: External ear normal.  Left Ear: External ear normal.  Nose: Nose normal.  Mouth/Throat: Oropharynx is clear and moist.  Eyes: Conjunctivae and EOM are normal. Pupils are equal, round, and reactive to light.  Neck: Normal range of motion. No JVD present. No thyromegaly present.  Pulmonary/Chest: She has no wheezes.  Abdominal: She exhibits no mass. There is no tenderness. There is no guarding.  Musculoskeletal: Normal range of motion.  5 cm laceration dorsal aspect of left foot.  No tendon involvement noted.  Neurovascularly intact distal to injury with good cap refill and dorsiflexion of toe.  NO fb seen in wound.   Lymphadenopathy:    She has no cervical adenopathy.  Neurological: She is alert and oriented to person, place, and time. She has normal reflexes. No cranial nerve deficit or sensory deficit. Gait normal. GCS eye subscore is 4. GCS verbal subscore is 5. GCS motor subscore is 6.  Reflex Scores:      Bicep reflexes are 2+ on the right side and 2+ on the left side.      Patellar reflexes  are 2+ on the right side and 2+ on the left side. Strength is normal and equal throughout. Cranial nerves grossly intact. Patient fluent.   Skin: Skin is warm and dry.  5 cm laceration   Psychiatric: She has a normal mood and affect. Her behavior is normal. Judgment and thought content normal.  Nursing note and vitals reviewed.   ED Course  LACERATION REPAIR Date/Time: 04/10/2014 1:28 PM Performed by: Breanna Mason Authorized by: Breanna Mason Consent: Verbal consent obtained. Risks and benefits: risks, benefits and alternatives were discussed Body area: lower extremity Location details: right foot Laceration  length: 5 cm Contamination: The wound is contaminated. Tendon involvement: none Nerve involvement: none Vascular damage: no Anesthesia: local infiltration Local anesthetic: lidocaine 1% without epinephrine Anesthetic total: 3 ml Patient sedated: no Preparation: Patient was prepped and draped in the usual sterile fashion. Irrigation solution: saline Irrigation method: syringe Amount of cleaning: extensive Debridement: minimal Degree of undermining: none Skin closure: 4-0 Prolene Number of sutures: 3 Technique: simple Approximation: loose Approximation difficulty: simple Dressing: 4x4 sterile gauze Patient tolerance: Patient tolerated the procedure well with no immediate complications   (including critical care time) DIAGNOSTIC STUDIES: Oxygen Saturation is 95% on RA, adequate by my interpretation.    COORDINATION OF CARE: 1:06 PM Discussed treatment plan with pt at bedside and pt agreed to plan.  1:17 PM Laceration repair with copious irrigation, 1L including bulb syringe. No foreign bodies seen. My plan is to put in 3 sutures.   1:19 PM Will prescribe antibiotics. Told pt to return to the ED with worsening pain, swelling, infection, pus, erythema. Advised pt to follow up with PCP next week.    MDM   Final diagnoses:  Dog bite of right foot, initial  encounter   Patient reports tdap upd.  Dog known to patient and family (is a family member's dog).  They are obtaining rabies vaccine documentation.  Plan post op boot and close follow up.    Breanna Pollack, MD 04/10/14 [phone]

## 2014-04-12 ENCOUNTER — Encounter: Payer: Self-pay | Admitting: Family Medicine

## 2014-04-12 ENCOUNTER — Ambulatory Visit (HOSPITAL_COMMUNITY)
Admission: RE | Admit: 2014-04-12 | Discharge: 2014-04-12 | Disposition: A | Discharge status: Home / Self Care | Payer: Medicare Other | Source: Ambulatory Visit | Attending: Family Medicine | Admitting: Family Medicine

## 2014-04-12 ENCOUNTER — Ambulatory Visit (INDEPENDENT_AMBULATORY_CARE_PROVIDER_SITE_OTHER): Payer: Medicare Other | Admitting: Family Medicine

## 2014-04-12 ENCOUNTER — Encounter (HOSPITAL_COMMUNITY): Payer: Self-pay

## 2014-04-12 VITALS — BP 102/80 | Temp 97.9°F | Ht 63.0 in | Wt 151.0 lb

## 2014-04-12 DIAGNOSIS — L03115 Cellulitis of right lower limb: Secondary | ICD-10-CM

## 2014-04-12 DIAGNOSIS — M25611 Stiffness of right shoulder, not elsewhere classified: Secondary | ICD-10-CM

## 2014-04-12 DIAGNOSIS — Z471 Aftercare following joint replacement surgery: Secondary | ICD-10-CM | POA: Diagnosis not present

## 2014-04-12 DIAGNOSIS — R531 Weakness: Secondary | ICD-10-CM

## 2014-04-12 DIAGNOSIS — Z96611 Presence of right artificial shoulder joint: Secondary | ICD-10-CM

## 2014-04-12 MED ORDER — CLINDAMYCIN HCL 300 MG PO CAPS: 300.0000 mg | ORAL_CAPSULE | Freq: Three times a day (TID) | ORAL | Status: DC

## 2014-04-12 NOTE — Therapy (Signed)
[facility] [address] [place], [place], [address] Phone: [phone]   Fax:  [phone]  Occupational Therapy Treatment  Patient Details  Name: Breanna Mason MRN: [id number] Date of Birth: 1945/07/25  Encounter Date: 04/12/2014      OT End of Session - 04/12/14 1515    Visit Number 4   Number of Visits 24   Date for OT Re-Evaluation 04/28/14   Authorization Type Medicare Part A   Authorization Time Period Before 10th visit   Authorization - Visit Number 4   Authorization - Number of Visits 10   OT Start Time 1438   OT Stop Time 1515   OT Time Calculation (min) 37 min   Activity Tolerance Patient tolerated treatment well   Behavior During Therapy [facility] for tasks assessed/performed      Past Medical History  Diagnosis Date  . Essential hypertension, benign   . GERD (gastroesophageal reflux disease)   . Asthma   . Mixed hyperlipidemia   . Impaired glucose tolerance   . Osteoporosis   . Anxiety   . Osteopenia   . Reflux   . IFG (impaired fasting glucose)   . Allergy   . PONV (postoperative nausea and vomiting)   . Pneumonia     hx  . Arthritis     Past Surgical History  Procedure Laterality Date  . Cesarean section    . Bilateral tubal ligation    . Shoulder surgery Right     x4  . Colonoscopy N/A 09/17/2013    Procedure: COLONOSCOPY;  Surgeon: Rogene Houston, MD;  [facility];  Service: Endoscopy;  Laterality: N/A;  1200  . Tubal ligation    . Carpal tunnel release Right     30 yrs  . Reverse shoulder arthroplasty Right 03/11/2014    Procedure: RIGHT REVERSE SHOULDER ARTHROPLASTY;  Surgeon: Marin Shutter, MD;  Location: [place];  Service: Orthopedics;  Laterality: Right;    There were no vitals taken for this visit.  Visit Diagnosis:  S/p reverse total shoulder arthroplasty, right  Decreased range of motion of right shoulder  Weakness generalized      Subjective Assessment - 04/12/14 1452    Symptoms  S: My  nephews dog bit my foot on Saturday. It was bleeding all over.    Limitations Supple Protocol: weeks 4-8 (12/3 - 12/31): AAROM and AROM in all planes - do not aggressively push ROM!  Weeks 8-12 (12/31- 1/28) PROM and AROM to tolerance, sidelying, gradual resistance   Currently in Pain? No/denies          [facility] OT Assessment - 04/12/14 1454    Precautions   Precautions Shoulder   Type of Shoulder Precautions PROM, progress to AAROM then AROM.           OT Treatments/Exercises (OP) - 04/12/14 1454    Shoulder Exercises: Supine   Protraction PROM;AAROM;10 reps   Horizontal ABduction PROM;AAROM;10 reps   External Rotation PROM;AAROM;10 reps   Internal Rotation PROM;AAROM;10 reps   Flexion PROM;AAROM;10 reps   ABduction PROM;AAROM;10 reps   Shoulder Exercises: Seated   Elevation AROM;12 reps   Extension AROM;12 reps   Row AROM;12 reps   Shoulder Exercises: Standing   Protraction AAROM;10 reps   Internal Rotation AAROM;10 reps   Flexion AAROM;10 reps   ABduction AAROM;10 reps   Other Standing Exercises External rotation; 10X; AAROM   Shoulder Exercises: Pulleys   Flexion 1 minute  abduction 1'  Shoulder Exercises: ROM/Strengthening   Thumb Tacks 1 min   Prot/Ret//Elev/Dep 1 min            OT Short Term Goals - 04/12/14 1453    OT SHORT TERM GOAL #1   Title Patient will be educated on HEP.   Status On-going   OT SHORT TERM GOAL #2   Title Patient will increase PROM of right shoulder to [facility] to increase ability to perform dressing tasks with less difficulty.   Status On-going   OT SHORT TERM GOAL #3   Title Patient will report a pain level of 1/10 or less with daily tasks.   Status On-going   OT SHORT TERM GOAL #4   Title Patient will increase right shoulder strength to 3/5 to increase ability to wash hair.    Status On-going   OT SHORT TERM GOAL #5   Title Patient will decrease fascial restrictions from max to mod amount.   Status On-going           OT Long Term Goals - 04/12/14 1453    OT LONG TERM GOAL #1   Title Patient will return to highest level of independence with BADL and leisure tasks.    Status On-going   OT LONG TERM GOAL #2   Title Patient will increase AROM of right shoulder to [facility] to increase ability reach into overhead cabinets with less difficulty.   Status On-going   OT LONG TERM GOAL #3   Title Patient will increase right shoulder strength 4/5 to increase ability to complete daily tasks with less difficulty.    Status On-going   OT LONG TERM GOAL #4   Title Patient will decrease fascial restrictions from mod to min amount.   Status On-going          Plan - 04/12/14 1516    Clinical Impression Statement A: Added pulleys and AAROM standing this session. Patient tolerated well. Needed max vc's for proper form (ie. arching back) when completing thumb tacks and pulleys.    Plan P: Work on increasing posture and form with exercises using less cueing.      Problem List Patient Active Problem List   Diagnosis Date Noted  . Rotator cuff tear arthropathy of right shoulder 03/12/2014  . S/P shoulder replacement 03/11/2014  . Chest discomfort 04/20/2013  . Esophageal reflux 09/07/2012  . Essential hypertension, benign 03/06/2011  . Mixed hyperlipidemia 03/06/2011  . Impaired glucose tolerance 03/06/2011  . Asthma 03/06/2011    Ailene Ravel, OTR/L,CBIS  [phone]  04/12/2014, 3:18 PM

## 2014-04-12 NOTE — Progress Notes (Signed)
   Subjective:    Patient ID: Breanna Mason, female    DOB: [birth year], 68 y.o.   MRN: [id number]  HPI Patient is here today for a follow up visit from the ER for a dog bite. Dog bite occurred on the top of the right foot. This happened on 04/10/14. Patient states that she has no other concerns at this time.   Patient states that she cannot take penicillins she says that penicillin derivatives make her very sick Review of Systems     Objective:   Physical Exam   lungs are clear hearts regular she has a lacerated foot with some redness around it no sign of any severe cellulitis is just more around the laceration that it's red       Assessment & Plan:  Increase clindamycin to 300 mg one tid for 7 days Suture out in 8 days  Warning signs regarding progressive infection was discussed. Patient does not need IV antibiotics at this point. If progressive troubles or if worse may need IV antibiotics possible hospitalization

## 2014-04-14 ENCOUNTER — Ambulatory Visit (HOSPITAL_COMMUNITY)
Admission: RE | Admit: 2014-04-14 | Discharge: 2014-04-14 | Disposition: A | Discharge status: Home / Self Care | Payer: Medicare Other | Source: Ambulatory Visit | Attending: Family Medicine | Admitting: Family Medicine

## 2014-04-14 DIAGNOSIS — M25611 Stiffness of right shoulder, not elsewhere classified: Secondary | ICD-10-CM

## 2014-04-14 DIAGNOSIS — Z96611 Presence of right artificial shoulder joint: Secondary | ICD-10-CM

## 2014-04-14 DIAGNOSIS — Z471 Aftercare following joint replacement surgery: Secondary | ICD-10-CM | POA: Diagnosis not present

## 2014-04-14 DIAGNOSIS — R531 Weakness: Secondary | ICD-10-CM

## 2014-04-14 NOTE — Patient Instructions (Signed)
Complete Each exercise 10 times each, 2 times per day.  Complete lying down initially.  As time goes on may complete in seated or standing.  ROM: Flexion - Wand   Bring wand directly over head, leading with right side. Reach back until stretch is felt. Hold ____ seconds. Repeat ____ times per set. Do ____ sets per session. Do ____ sessions per day.  http://orth.exer.us/744   Copyright  VHI. All rights reserved.   ROM: Abduction - Wand   Holding wand with left hand palm up, push wand directly out to side, leading with other hand palm down, until stretch is felt. Hold ____ seconds. Repeat ____ times per set. Do ____ sets per session. Do ____ sessions per day.  http://orth.exer.us/746   Copyright  VHI. All rights reserved.   ROM: Horizontal Abduction / Adduction - Wand   Keeping both palms down, push right hand across body with other hand. Then pull back across body, keeping arms parallel to floor. Do not allow trunk to twist. Hold ____ seconds. Repeat ____ times per set. Do ____ sets per session. Do ____ sessions per day.  http://orth.exer.us/752   Copyright  VHI. All rights reserved.   ROM: External / Internal Rotation - Wand   Holding wand with left hand palm up, push out from body with other hand, palm down. Keep both elbows bent. When stretch is felt, hold ____ seconds. Repeat to other side, leading with same hand. Keep elbows bent. Repeat ____ times per set. Do ____ sets per session. Do ____ sessions per day.  http://orth.exer.us/748   Copyright  VHI. All rights reserved.

## 2014-04-14 NOTE — Therapy (Signed)
[facility] [address] [place], [place], [address] Phone: [phone]   Fax:  [phone]  Occupational Therapy Treatment  Patient Details  Name: Breanna Mason MRN: [id number] Date of Birth: 1946-04-15  Encounter Date: 04/14/2014      OT End of Session - 04/14/14 1535    Visit Number 5   Number of Visits 24   Date for OT Re-Evaluation 04/28/14   Authorization Type Medicare Part A   Authorization Time Period Before 10th visit   Authorization - Visit Number 5   Authorization - Number of Visits 10   OT Start Time 1436   OT Stop Time 1530   OT Time Calculation (min) 54 min   Activity Tolerance Patient tolerated treatment well   Behavior During Therapy [facility] for tasks assessed/performed      Past Medical History  Diagnosis Date  . Essential hypertension, benign   . GERD (gastroesophageal reflux disease)   . Asthma   . Mixed hyperlipidemia   . Impaired glucose tolerance   . Osteoporosis   . Anxiety   . Osteopenia   . Reflux   . IFG (impaired fasting glucose)   . Allergy   . PONV (postoperative nausea and vomiting)   . Pneumonia     hx  . Arthritis     Past Surgical History  Procedure Laterality Date  . Cesarean section    . Bilateral tubal ligation    . Shoulder surgery Right     x4  . Colonoscopy N/A 09/17/2013    Procedure: COLONOSCOPY;  Surgeon: Rogene Houston, MD;  [facility];  Service: Endoscopy;  Laterality: N/A;  1200  . Tubal ligation    . Carpal tunnel release Right     30 yrs  . Reverse shoulder arthroplasty Right 03/11/2014    Procedure: RIGHT REVERSE SHOULDER ARTHROPLASTY;  Surgeon: Marin Shutter, MD;  Location: [place];  Service: Orthopedics;  Laterality: Right;    There were no vitals taken for this visit.  Visit Diagnosis:  S/p reverse total shoulder arthroplasty, right  Decreased range of motion of right shoulder  Weakness generalized      Subjective Assessment - 04/14/14 1509    Symptoms  S:  I can do really well reaching over my head when I am laying dow.  When I try it standing up thats a problem.   Limitations Supple Protocol: weeks 4-8 (12/3 - 12/31): AAROM and AROM in all planes - do not aggressively push ROM!  Weeks 8-12 (12/31- 1/28) PROM and AROM to tolerance, sidelying, gradual resistance   Currently in Pain? Yes   Pain Score 1    Pain Location Shoulder   Pain Orientation Right   Pain Descriptors / Indicators Aching   Pain Type Acute pain            OT Treatments/Exercises (OP) - 04/14/14 1510    Shoulder Exercises: Supine   Protraction PROM;10 reps;AAROM;15 reps   Horizontal ABduction PROM;10 reps;AAROM;15 reps   External Rotation PROM;10 reps;AAROM;15 reps   Internal Rotation PROM;10 reps;AAROM;15 reps   Flexion PROM;10 reps;AAROM;15 reps   ABduction PROM;10 reps;AAROM;15 reps   Shoulder Exercises: Seated   Elevation AROM;15 reps   Extension AROM;15 reps   Row AROM;15 reps   Shoulder Exercises: Standing   Protraction AAROM;10 reps;Limitations   Protraction Limitations min tactile cuing to depress shoulder blade   Flexion AAROM;10 reps   ABduction AAROM;10 reps   Other Standing Exercises horizontal abduction AAROM  10 times with min tactile cues to depress shoulder.   Manual Therapy   Manual Therapy Myofascial release   Myofascial Release MFR and manual stretching to RUE bicpe, upper arm, trap, and scap regions to decrease fascial restrictions and promote deccreased pain.            OT Short Term Goals - 04/14/14 1537    OT SHORT TERM GOAL #1   Title Patient will be educated on HEP.   Status On-going   OT SHORT TERM GOAL #2   Title Patient will increase PROM of right shoulder to [facility] to increase ability to perform dressing tasks with less difficulty.   Status On-going   OT SHORT TERM GOAL #3   Title Patient will report a pain level of 1/10 or less with daily tasks.   Status On-going   OT SHORT TERM GOAL #4   Title Patient will increase  right shoulder strength to 3/5 to increase ability to wash hair.    Status On-going   OT SHORT TERM GOAL #5   Title Patient will decrease fascial restrictions from max to mod amount.   Status On-going          OT Long Term Goals - 04/14/14 1537    OT LONG TERM GOAL #1   Title Patient will return to highest level of independence with BADL and leisure tasks.    Status On-going   OT LONG TERM GOAL #2   Title Patient will increase AROM of right shoulder to [facility] to increase ability reach into overhead cabinets with less difficulty.   Status On-going   OT LONG TERM GOAL #3   Title Patient will increase right shoulder strength 4/5 to increase ability to complete daily tasks with less difficulty.    Status On-going   OT LONG TERM GOAL #4   Title Patient will decrease fascial restrictions from mod to min amount.   Status On-going          Plan - 04/14/14 1536    Clinical Impression Statement A:  Removed glue from healed surgical incision this date.  Patient had significantly less pain and improved mobility after MFR this date.   Plan P:  Decrease facilitation required with AAROM in standing.  Follow up on HEP.    Problem List Patient Active Problem List   Diagnosis Date Noted  . Rotator cuff tear arthropathy of right shoulder 03/12/2014  . S/P shoulder replacement 03/11/2014  . Chest discomfort 04/20/2013  . Esophageal reflux 09/07/2012  . Essential hypertension, benign 03/06/2011  . Mixed hyperlipidemia 03/06/2011  . Impaired glucose tolerance 03/06/2011  . Asthma 03/06/2011    Vangie Bicker, OTR/L [phone]  04/14/2014, 3:38 PM

## 2014-04-20 ENCOUNTER — Ambulatory Visit (HOSPITAL_COMMUNITY)
Admission: RE | Admit: 2014-04-20 | Discharge: 2014-04-20 | Disposition: A | Discharge status: Home / Self Care | Payer: Medicare Other | Source: Ambulatory Visit | Attending: Family Medicine | Admitting: Family Medicine

## 2014-04-20 ENCOUNTER — Ambulatory Visit (INDEPENDENT_AMBULATORY_CARE_PROVIDER_SITE_OTHER): Payer: Medicare Other | Admitting: Family Medicine

## 2014-04-20 ENCOUNTER — Encounter (HOSPITAL_COMMUNITY): Payer: Self-pay

## 2014-04-20 ENCOUNTER — Encounter: Payer: Self-pay | Admitting: Family Medicine

## 2014-04-20 VITALS — BP 132/78 | Ht 63.0 in | Wt 151.0 lb

## 2014-04-20 DIAGNOSIS — Z471 Aftercare following joint replacement surgery: Secondary | ICD-10-CM | POA: Diagnosis not present

## 2014-04-20 DIAGNOSIS — Z96611 Presence of right artificial shoulder joint: Secondary | ICD-10-CM

## 2014-04-20 DIAGNOSIS — L0390 Cellulitis, unspecified: Secondary | ICD-10-CM

## 2014-04-20 DIAGNOSIS — M25611 Stiffness of right shoulder, not elsewhere classified: Secondary | ICD-10-CM

## 2014-04-20 DIAGNOSIS — R531 Weakness: Secondary | ICD-10-CM

## 2014-04-20 NOTE — Progress Notes (Signed)
   Subjective:    Patient ID: Breanna Mason, female    DOB: 01-02-1946, 68 y.o.   MRN: [id number]  HPISuture removal on right foot. Had sutures put in at [facility] on 04/10/14.  Patient still notes an element of tenderness at site of injury. See prior notes. Still on antibiotics.  No fever. No red streaks. No chills.  Review of Systems ROS otherwise negative    Objective:   Physical Exam Alert vitals stable. No acute distress lungs clear. Heart rare rhythm. Dorsum of foot erythema present tenderness present no frank discharge healing well time the remove sutures either way       Assessment & Plan:  Impression cellulitis with suture removal plan maintain antibiotics as prescribed. Local measures discussed warning signs discussed. WSL

## 2014-04-20 NOTE — Patient Instructions (Signed)
SHOULDER: Deltoid Strengthening at [place], and cross right thumb over left thumb to engage muscle in upper right arm. Hold hands on wall (don't hold elbow to wall as shown in picture).  Slide hands up and down wall (chest to forehead levels), keeping right thumb pressed and muscle engaged.  Smoothly slide up and down  for 1 minute. Repeat 4-6 times per day.   Bea Graff , MS, OTR/L [place]

## 2014-04-20 NOTE — Therapy (Signed)
[facility] [address] [place], [place], [address] Phone: [phone]   Fax:  [phone]  Occupational Therapy Treatment  Patient Details  Name: Breanna Mason MRN: [id number] Date of Birth: 1945-08-18  Encounter Date: 04/20/2014      OT End of Session - 04/20/14 1151    Visit Number 6   Number of Visits 24   Date for OT Re-Evaluation 04/28/14   Authorization Type Medicare Part A   Authorization Time Period Before 10th visit   Authorization - Visit Number 6   Authorization - Number of Visits 10   OT Start Time 1106   OT Stop Time 1148   OT Time Calculation (min) 42 min   Activity Tolerance Patient tolerated treatment well   Behavior During Therapy [facility] for tasks assessed/performed      Past Medical History  Diagnosis Date  . Essential hypertension, benign   . GERD (gastroesophageal reflux disease)   . Asthma   . Mixed hyperlipidemia   . Impaired glucose tolerance   . Osteoporosis   . Anxiety   . Osteopenia   . Reflux   . IFG (impaired fasting glucose)   . Allergy   . PONV (postoperative nausea and vomiting)   . Pneumonia     hx  . Arthritis     Past Surgical History  Procedure Laterality Date  . Cesarean section    . Bilateral tubal ligation    . Shoulder surgery Right     x4  . Colonoscopy N/A 09/17/2013    Procedure: COLONOSCOPY;  Surgeon: Rogene Houston, MD;  [facility];  Service: Endoscopy;  Laterality: N/A;  1200  . Tubal ligation    . Carpal tunnel release Right     30 yrs  . Reverse shoulder arthroplasty Right 03/11/2014    Procedure: RIGHT REVERSE SHOULDER ARTHROPLASTY;  Surgeon: Marin Shutter, MD;  Location: [place];  Service: Orthopedics;  Laterality: Right;    There were no vitals taken for this visit.  Visit Diagnosis:  S/p reverse total shoulder arthroplasty, right  Decreased range of motion of right shoulder  Weakness generalized      Subjective Assessment - 04/20/14 1128    Limitations Supple Protocol: weeks 4-8 (12/3 - 12/31): AAROM and AROM in all planes - do not aggressively push ROM!  Weeks 8-12 (12/31- 1/28) PROM and AROM to tolerance, sidelying, gradual resistance          [facility] OT Assessment - 04/20/14 1107    Precautions   Precautions Shoulder   Type of Shoulder Precautions PROM, progress to AAROM then AROM.           OT Treatments/Exercises (OP) - 04/20/14 1107    Shoulder Exercises: Supine   Protraction PROM;10 reps;AAROM;15 reps   Horizontal ABduction PROM;10 reps;AAROM;15 reps   External Rotation PROM;10 reps;AAROM;15 reps   Internal Rotation PROM;10 reps;AAROM;15 reps   Flexion PROM;10 reps;AAROM;15 reps   ABduction PROM;10 reps;AAROM;15 reps   Shoulder Exercises: Seated   Elevation AROM;15 reps   Extension AROM;15 reps   Row AROM;15 reps   Shoulder Exercises: Standing   Protraction AAROM;10 reps   Internal Rotation AAROM;10 reps   Flexion AAROM;10 reps   ABduction AAROM;10 reps   Other Standing Exercises External rotation and horizontal abduction, 10 reps each AAROM   Other Standing Exercises prayer deltoid exercise - 1 min on wall   Shoulder Exercises: Pulleys   Flexion 1 minute  1 min in abduction  Shoulder Exercises: ROM/Strengthening   Thumb Tacks 1 min   Manual Therapy   Manual Therapy Myofascial release   Myofascial Release MFR and manual stretching to RUE bicpe, upper arm, trap, and scap regions to decrease fascial restrictions and promote deccreased pain.            OT Education - 04/20/14 1151    Education provided Yes   Education Details prayer deltoid strengthening on wall   Person(s) Educated Patient   Methods Explanation;Demonstration;Handout   Comprehension Verbalized understanding;Returned demonstration          OT Short Term Goals - 04/20/14 1155    OT SHORT TERM GOAL #1   Title Patient will be educated on HEP.   Status On-going   OT SHORT TERM GOAL #2   Title Patient will increase PROM of  right shoulder to [facility] to increase ability to perform dressing tasks with less difficulty.   Status On-going   OT SHORT TERM GOAL #3   Title Patient will report a pain level of 1/10 or less with daily tasks.   Status On-going   OT SHORT TERM GOAL #4   Title Patient will increase right shoulder strength to 3/5 to increase ability to wash hair.    Status On-going   OT SHORT TERM GOAL #5   Title Patient will decrease fascial restrictions from max to mod amount.   Status On-going          OT Long Term Goals - 04/20/14 1155    OT LONG TERM GOAL #1   Title Patient will return to highest level of independence with BADL and leisure tasks.    Status On-going   OT LONG TERM GOAL #2   Title Patient will increase AROM of right shoulder to [facility] to increase ability reach into overhead cabinets with less difficulty.   Status On-going   OT LONG TERM GOAL #3   Title Patient will increase right shoulder strength 4/5 to increase ability to complete daily tasks with less difficulty.    Status On-going   OT LONG TERM GOAL #4   Title Patient will decrease fascial restrictions from mod to min amount.   Status On-going          Plan - 04/20/14 1153    Clinical Impression Statement Pt had good tolerance of all exercises this session. Standing AAROM exercises completed in front of mirror to encourage improved form and decreased shoulder elevation. Educated pt on deltoid strengthening prayer exerrcises to decrease shoulder elevation - provided pt with HEp handout and pt verbalized and demonstrated understanding.  Pt indicated overall improved tolerance of pulleys this session over previous session.     Plan add AROM supine and increase AAROM standing reps.  Follow up on deltoid strengthening prayer exercise for HEP.     OT Home Exercise Plan Table slides  12/15 added deltoid strengthening prayer       Problem List Patient Active Problem List   Diagnosis Date Noted  . Rotator cuff tear arthropathy of  right shoulder 03/12/2014  . S/P shoulder replacement 03/11/2014  . Chest discomfort 04/20/2013  . Esophageal reflux 09/07/2012  . Essential hypertension, benign 03/06/2011  . Mixed hyperlipidemia 03/06/2011  . Impaired glucose tolerance 03/06/2011  . Asthma 03/06/2011    Bea Graff , MS, OTR/L [facility] [phone] 04/20/2014, 11:56 AM

## 2014-04-22 ENCOUNTER — Ambulatory Visit (HOSPITAL_COMMUNITY)
Admission: RE | Admit: 2014-04-22 | Discharge: 2014-04-22 | Disposition: A | Discharge status: Home / Self Care | Payer: Medicare Other | Source: Ambulatory Visit | Attending: Family Medicine | Admitting: Family Medicine

## 2014-04-22 ENCOUNTER — Encounter (HOSPITAL_COMMUNITY): Payer: Self-pay

## 2014-04-22 DIAGNOSIS — Z471 Aftercare following joint replacement surgery: Secondary | ICD-10-CM | POA: Diagnosis not present

## 2014-04-22 DIAGNOSIS — R531 Weakness: Secondary | ICD-10-CM

## 2014-04-22 DIAGNOSIS — M25611 Stiffness of right shoulder, not elsewhere classified: Secondary | ICD-10-CM

## 2014-04-22 DIAGNOSIS — Z96611 Presence of right artificial shoulder joint: Secondary | ICD-10-CM

## 2014-04-22 NOTE — Therapy (Signed)
[facility] [address] [place], [place], [address] Phone: [phone]   Fax:  [phone]  Occupational Therapy Treatment  Patient Details  Name: Breanna Mason MRN: [id number] Date of Birth: [birth year]  Encounter Date: 04/22/2014      OT End of Session - 04/22/14 1155    Visit Number 7   Number of Visits 24   Date for OT Re-Evaluation 04/28/14   Authorization Type Medicare Part A   Authorization Time Period Before 10th visit   Authorization - Visit Number 7   Authorization - Number of Visits 10   OT Start Time 1107   OT Stop Time 1147   OT Time Calculation (min) 40 min   Activity Tolerance Patient tolerated treatment well   Behavior During Therapy [facility] for tasks assessed/performed      Past Medical History  Diagnosis Date  . Essential hypertension, benign   . GERD (gastroesophageal reflux disease)   . Asthma   . Mixed hyperlipidemia   . Impaired glucose tolerance   . Osteoporosis   . Anxiety   . Osteopenia   . Reflux   . IFG (impaired fasting glucose)   . Allergy   . PONV (postoperative nausea and vomiting)   . Pneumonia     hx  . Arthritis     Past Surgical History  Procedure Laterality Date  . Cesarean section    . Bilateral tubal ligation    . Shoulder surgery Right     x4  . Colonoscopy N/A 09/17/2013    Procedure: COLONOSCOPY;  Surgeon: Rogene Houston, MD;  [facility];  Service: Endoscopy;  Laterality: N/A;  1200  . Tubal ligation    . Carpal tunnel release Right     30 yrs  . Reverse shoulder arthroplasty Right 03/11/2014    Procedure: RIGHT REVERSE SHOULDER ARTHROPLASTY;  Surgeon: Marin Shutter, MD;  Location: [place];  Service: Orthopedics;  Laterality: Right;    There were no vitals taken for this visit.  Visit Diagnosis:  S/p reverse total shoulder arthroplasty, right  Decreased range of motion of right shoulder  Weakness generalized      Subjective Assessment - 04/22/14 1124    Symptoms S: My arm is doing real good. I've been working it a lot.    Currently in Pain? No/denies          [facility] OT Assessment - 04/22/14 1124    Precautions   Precautions Shoulder   Type of Shoulder Precautions PROM, progress to AAROM then AROM.           OT Treatments/Exercises (OP) - 04/22/14 1124    Shoulder Exercises: Supine   Protraction PROM;10 reps;AAROM;15 reps   Horizontal ABduction PROM;10 reps;AAROM;15 reps   External Rotation PROM;10 reps;AAROM;15 reps   Internal Rotation PROM;10 reps;AAROM;15 reps   Flexion PROM;10 reps;AAROM;15 reps   ABduction PROM;10 reps;AAROM;15 reps   Shoulder Exercises: Standing   Protraction AAROM;10 reps   Internal Rotation AAROM;10 reps   Flexion AAROM;10 reps   ABduction AAROM;10 reps   Other Standing Exercises External rotation and horizontal abduction, 10 reps each AAROM   Shoulder Exercises: Pulleys   Flexion 1 minute  1 min abduction   Shoulder Exercises: ROM/Strengthening   Thumb Tacks 1 min   Proximal Shoulder Strengthening, Supine 12X   Proximal Shoulder Strengthening, Seated 10X   Prot/Ret//Elev/Dep 1 min   Manual Therapy   Manual Therapy Myofascial release   Myofascial Release Muscle energy technique to  right anterior deltoid to relax tone and improve range of motion.            OT Short Term Goals - 04/22/14 1126    OT SHORT TERM GOAL #1   Title Patient will be educated on HEP.   Status On-going   OT SHORT TERM GOAL #2   Title Patient will increase PROM of right shoulder to [facility] to increase ability to perform dressing tasks with less difficulty.   Status On-going   OT SHORT TERM GOAL #3   Title Patient will report a pain level of 1/10 or less with daily tasks.   Status On-going   OT SHORT TERM GOAL #4   Title Patient will increase right shoulder strength to 3/5 to increase ability to wash hair.    Status On-going   OT SHORT TERM GOAL #5   Title Patient will decrease fascial restrictions from max to mod  amount.   Status On-going          OT Long Term Goals - 04/22/14 1126    OT LONG TERM GOAL #1   Title Patient will return to highest level of independence with BADL and leisure tasks.    Status On-going   OT LONG TERM GOAL #2   Title Patient will increase AROM of right shoulder to [facility] to increase ability reach into overhead cabinets with less difficulty.   Status On-going   OT LONG TERM GOAL #3   Title Patient will increase right shoulder strength 4/5 to increase ability to complete daily tasks with less difficulty.    Status On-going   OT LONG TERM GOAL #4   Title Patient will decrease fascial restrictions from mod to min amount.   Status On-going          Plan - 04/22/14 1155    Clinical Impression Statement A: Added proximal shoulder strengthening supine and standing. patient tolerated well. Continues to hyperextend back when completing thumb tacks and required physical and tactile cues for correction. Floor mirror was used for AAROM standing exercises.    Plan P: Added AROM supine and wall wash.           Problem List Patient Active Problem List   Diagnosis Date Noted  . Rotator cuff tear arthropathy of right shoulder 03/12/2014  . S/P shoulder replacement 03/11/2014  . Chest discomfort 04/20/2013  . Esophageal reflux 09/07/2012  . Essential hypertension, benign 03/06/2011  . Mixed hyperlipidemia 03/06/2011  . Impaired glucose tolerance 03/06/2011  . Asthma 03/06/2011    Ailene Ravel, OTR/L,CBIS  [phone]  04/22/2014, 11:57 AM

## 2014-04-26 ENCOUNTER — Ambulatory Visit (HOSPITAL_COMMUNITY)
Admission: RE | Admit: 2014-04-26 | Discharge: 2014-04-26 | Disposition: A | Discharge status: Home / Self Care | Payer: Medicare Other | Source: Ambulatory Visit | Attending: Family Medicine | Admitting: Family Medicine

## 2014-04-26 ENCOUNTER — Encounter (HOSPITAL_COMMUNITY): Payer: Self-pay

## 2014-04-26 DIAGNOSIS — Z96611 Presence of right artificial shoulder joint: Secondary | ICD-10-CM

## 2014-04-26 DIAGNOSIS — R531 Weakness: Secondary | ICD-10-CM

## 2014-04-26 DIAGNOSIS — M25611 Stiffness of right shoulder, not elsewhere classified: Secondary | ICD-10-CM

## 2014-04-26 DIAGNOSIS — Z471 Aftercare following joint replacement surgery: Secondary | ICD-10-CM | POA: Diagnosis not present

## 2014-04-26 NOTE — Therapy (Signed)
[place], [place], [address] Phone: [phone]   Fax:  [phone]  Occupational Therapy Treatment  Patient Details  Name: Breanna Mason MRN: [id number] Date of Birth: 26-Sep-1945  Encounter Date: 04/26/2014      OT End of Session - 04/26/14 1101    Visit Number 8   Number of Visits 24   Date for OT Re-Evaluation 04/28/14   Authorization Type Medicare Part A   Authorization Time Period Before 10th visit   Authorization - Visit Number 8   Authorization - Number of Visits 10   OT Start Time 1016   OT Stop Time 1100   OT Time Calculation (min) 44 min   Activity Tolerance Patient tolerated treatment well   Behavior During Therapy [facility] for tasks assessed/performed      Past Medical History  Diagnosis Date  . Essential hypertension, benign   . GERD (gastroesophageal reflux disease)   . Asthma   . Mixed hyperlipidemia   . Impaired glucose tolerance   . Osteoporosis   . Anxiety   . Osteopenia   . Reflux   . IFG (impaired fasting glucose)   . Allergy   . PONV (postoperative nausea and vomiting)   . Pneumonia     hx  . Arthritis     Past Surgical History  Procedure Laterality Date  . Cesarean section    . Bilateral tubal ligation    . Shoulder surgery Right     x4  . Colonoscopy N/A 09/17/2013    Procedure: COLONOSCOPY;  Surgeon: Rogene Houston, MD;  [facility];  Service: Endoscopy;  Laterality: N/A;  1200  . Tubal ligation    . Carpal tunnel release Right     30 yrs  . Reverse shoulder arthroplasty Right 03/11/2014    Procedure: RIGHT REVERSE SHOULDER ARTHROPLASTY;  Surgeon: Marin Shutter, MD;  Location: [place];  Service: Orthopedics;  Laterality: Right;    There were no vitals taken for this visit.  Visit Diagnosis:  S/p reverse total shoulder arthroplasty, right  Decreased range of motion of right shoulder  Weakness generalized      Subjective Assessment - 04/26/14  1018    Symptoms "i was working at [employer] last night, and peoeple kept comign up and touching my arm.  they said theyw ere forgetting which one it was!"   Currently in Pain? No/denies          [facility] OT Assessment - 04/26/14 1019    Precautions   Precautions Shoulder   Type of Shoulder Precautions PROM, progress to AAROM then AROM.                OT Treatments/Exercises (OP) - 04/26/14 1019    Shoulder Exercises: Supine   Protraction PROM;AROM;10 reps   Horizontal ABduction PROM;AROM;10 reps   External Rotation PROM;AROM;10 reps   Internal Rotation PROM;AROM;10 reps   Flexion PROM;AROM;10 reps   ABduction PROM;AROM;10 reps   Shoulder Exercises: Standing   Protraction AAROM;12 reps   Internal Rotation AAROM;12 reps   Flexion AAROM;12 reps   ABduction AAROM;12 reps   Other Standing Exercises External rotation and horizontal abduction, 12 reps each AAROM   Shoulder Exercises: Pulleys   Flexion 1 minute  1 min abduction, with cueing for decrased shoulder elevation   Shoulder Exercises: ROM/Strengthening   Proximal Shoulder Strengthening, Supine 12x   Shoulder Exercises: Stretch   Table Stretch - External Rotation 3 reps;10 seconds  Table Stretch - External Rotation Limitations stargazer stretch 3 reps 10 seconds   Other Shoulder Stretches towel IR stretch - 3 reps 10 second holds   Manual Therapy   Manual Therapy Myofascial release   Myofascial Release MFR and manual stretching to RUE bicpe, upper arm, trap, and scap regions to decrease fascial restrictions and promote deccreased pain.                    OT Short Term Goals - 04/26/14 1102    OT SHORT TERM GOAL #1   Title Patient will be educated on HEP.   Status On-going   OT SHORT TERM GOAL #2   Title Patient will increase PROM of right shoulder to [facility] to increase ability to perform dressing tasks with less difficulty.   Status On-going   OT SHORT TERM GOAL #3   Title Patient will report a pain level  of 1/10 or less with daily tasks.   Status On-going   OT SHORT TERM GOAL #4   Title Patient will increase right shoulder strength to 3/5 to increase ability to wash hair.    Status On-going   OT SHORT TERM GOAL #5   Title Patient will decrease fascial restrictions from max to mod amount.   Status On-going           OT Long Term Goals - 04/26/14 1103    OT LONG TERM GOAL #1   Title Patient will return to highest level of independence with BADL and leisure tasks.    Status On-going   OT LONG TERM GOAL #2   Title Patient will increase AROM of right shoulder to [facility] to increase ability reach into overhead cabinets with less difficulty.   Status On-going   OT LONG TERM GOAL #3   Title Patient will increase right shoulder strength 4/5 to increase ability to complete daily tasks with less difficulty.    Status On-going   OT LONG TERM GOAL #4   Title Patient will decrease fascial restrictions from mod to min amount.   Status On-going               Plan - 04/26/14 1102    Clinical Impression Statement Added AROm supien this session, and pt tolerated well with minimal increases in pain.  Added ER stretches, stargazer and table stretch, and towel stretch for improved IR.  Increased AAROM standing reps and pt tolerated well. Some cueing requred for decreased shoulder elevation. continued pulleys for improved flexion and abduction.      Plan Add wall wash.  Re-evaluate for MD visit.        Problem List Patient Active Problem List   Diagnosis Date Noted  . Rotator cuff tear arthropathy of right shoulder 03/12/2014  . S/P shoulder replacement 03/11/2014  . Chest discomfort 04/20/2013  . Esophageal reflux 09/07/2012  . Essential hypertension, benign 03/06/2011  . Mixed hyperlipidemia 03/06/2011  . Impaired glucose tolerance 03/06/2011  . Asthma 03/06/2011    Bea Graff , MS, OTR/L [facility] [phone] 04/26/2014, 11:04 AM  [place], [place], [address] Phone: [phone]   Fax:  [phone]

## 2014-04-28 ENCOUNTER — Encounter (HOSPITAL_COMMUNITY): Payer: Medicare Other | Admitting: Specialist

## 2014-04-28 ENCOUNTER — Ambulatory Visit (HOSPITAL_COMMUNITY)
Admission: RE | Admit: 2014-04-28 | Discharge: 2014-04-28 | Disposition: A | Discharge status: Home / Self Care | Payer: Medicare Other | Source: Ambulatory Visit | Attending: Family Medicine | Admitting: Family Medicine

## 2014-04-28 ENCOUNTER — Encounter (HOSPITAL_COMMUNITY): Payer: Self-pay

## 2014-04-28 DIAGNOSIS — Z96611 Presence of right artificial shoulder joint: Secondary | ICD-10-CM

## 2014-04-28 DIAGNOSIS — Z471 Aftercare following joint replacement surgery: Secondary | ICD-10-CM | POA: Diagnosis not present

## 2014-04-28 DIAGNOSIS — M25611 Stiffness of right shoulder, not elsewhere classified: Secondary | ICD-10-CM

## 2014-04-28 DIAGNOSIS — R531 Weakness: Secondary | ICD-10-CM

## 2014-04-28 NOTE — Therapy (Signed)
[place], [place], [address] Phone: [phone]   Fax:  [phone]  Occupational Therapy Treatment  Patient Details  Name: Breanna Mason MRN: [id number] Date of Birth: May 28, 1945  Encounter Date: 04/28/2014      OT End of Session - 04/28/14 1516    Visit Number 9   Number of Visits 24   Date for OT Re-Evaluation 04/28/14   Authorization Type Medicare Part A   Authorization Time Period Before 19th visit   Authorization - Visit Number 9   Authorization - Number of Visits 19   OT Start Time 1435   OT Stop Time 1518   OT Time Calculation (min) 43 min   Activity Tolerance Patient tolerated treatment well   Behavior During Therapy [facility] for tasks assessed/performed      Past Medical History  Diagnosis Date  . Essential hypertension, benign   . GERD (gastroesophageal reflux disease)   . Asthma   . Mixed hyperlipidemia   . Impaired glucose tolerance   . Osteoporosis   . Anxiety   . Osteopenia   . Reflux   . IFG (impaired fasting glucose)   . Allergy   . PONV (postoperative nausea and vomiting)   . Pneumonia     hx  . Arthritis     Past Surgical History  Procedure Laterality Date  . Cesarean section    . Bilateral tubal ligation    . Shoulder surgery Right     x4  . Colonoscopy N/A 09/17/2013    Procedure: COLONOSCOPY;  Surgeon: Rogene Houston, MD;  [facility];  Service: Endoscopy;  Laterality: N/A;  1200  . Tubal ligation    . Carpal tunnel release Right     30 yrs  . Reverse shoulder arthroplasty Right 03/11/2014    Procedure: RIGHT REVERSE SHOULDER ARTHROPLASTY;  Surgeon: Marin Shutter, MD;  Location: [place];  Service: Orthopedics;  Laterality: Right;    There were no vitals taken for this visit.  Visit Diagnosis:  S/p reverse total shoulder arthroplasty, right  Decreased range of motion of right shoulder  Weakness generalized      Subjective Assessment - 04/28/14  1431    Symptoms "I thin I'mdoing ok... but I still can't pick this arm up my myself."   Limitations Supple Protocol: weeks 4-8 (12/3 - 12/31): AAROM and AROM in all planes - do not aggressively push ROM!  Weeks 8-12 (12/31- 1/28) PROM and AROM to tolerance, sidelying, gradual resistance   Currently in Pain? No/denies          [facility] OT Assessment - 04/28/14 1437    Precautions   Precautions Shoulder   Type of Shoulder Precautions PROM, progress to AAROM then AROM.    Prior Function   Level of Independence Independent with basic ADLs;Independent with gait   Vocation Retired   ADL   ADL comments pt reports improvements in derssing, wiping, and reaching tasks.  Pt is still having difficulty washing her hair.   Observation/Other Assessments   Focus on Therapeutic Outcomes (FOTO)  FOTO 35/100   AROM   Overall AROM Comments Assessed in sitting, with ER/IR adducted  Previous 11/25   Right Shoulder Flexion 94 Degrees  supine 109 previous)   Right Shoulder ABduction 94 Degrees  65 supine previous   Right Shoulder Internal Rotation 107 Degrees  90 supine previous   Right Shoulder External Rotation -15 Degrees  -15 previous   PROM  Overall PROM Comments Assessed supine. IR/ER adducted  previous Eval 11/25   Right Shoulder Flexion 147 Degrees  121   Right Shoulder ABduction 157 Degrees  83   Right Shoulder Internal Rotation 96 Degrees  90   Right Shoulder External Rotation 64 Degrees  10               OT Treatments/Exercises (OP) - 04/28/14 1439    Shoulder Exercises: Supine   Protraction PROM;AROM;10 reps   Horizontal ABduction PROM;AROM;10 reps   External Rotation PROM;AROM;10 reps   Internal Rotation PROM;AROM;10 reps   Flexion PROM;AROM;10 reps   ABduction PROM;AROM;10 reps   Shoulder Exercises: Standing   Protraction AAROM;12 reps   Internal Rotation AAROM;12 reps   Flexion AAROM;12 reps   ABduction AAROM;12 reps   Other Standing Exercises External rotation  and horizontal abduction, 12 reps each AAROM   Manual Therapy   Manual Therapy Myofascial release   Myofascial Release Myofascial release and manual stretching to right upper arm, shoulder, scapular region and associated areas to decrease pain and fascial restrictions and improve pain free mobility in his right shoulder region.                    OT Short Term Goals - 04/28/14 1511    OT SHORT TERM GOAL #1   Title Patient will be educated on HEP.   Status Achieved   OT SHORT TERM GOAL #2   Title Patient will increase PROM of right shoulder to [facility] to increase ability to perform dressing tasks with less difficulty.   Status Achieved   OT SHORT TERM GOAL #3   Title Patient will report a pain level of 1/10 or less with daily tasks.   Status On-going   OT SHORT TERM GOAL #4   Title Patient will increase right shoulder strength to 3/5 to increase ability to wash hair.    Status On-going   OT SHORT TERM GOAL #5   Title Patient will decrease fascial restrictions from max to mod amount.   Status Achieved           OT Long Term Goals - 04/28/14 1514    OT LONG TERM GOAL #1   Title Patient will return to highest level of independence with BADL and leisure tasks.    Status On-going   OT LONG TERM GOAL #2   Title Patient will increase AROM of right shoulder to [facility] to increase ability reach into overhead cabinets with less difficulty.   Status On-going   OT LONG TERM GOAL #3   Title Patient will increase right shoulder strength 4/5 to increase ability to complete daily tasks with less difficulty.    Status On-going   OT LONG TERM GOAL #4   Title Patient will decrease fascial restrictions from mod to min amount.   Status On-going               Plan - 04/28/14 1525    Clinical Impression Statement Re-Evaluation compelted this session.  pt has progessed well in first month of outpatient therapy, meeting 3/5 STG. Pt is progressing well towards remaining 2/5 and 4/4 LTG.   pt has concerns about lack of AROM, but reassured pt about good PROM progress and continuation of RUE strengthening.  Pt will continue to benefit form skilled OT services.   Rehab Potential Excellent   OT Frequency 2x / week   OT Duration 12 weeks  4/12 weeks completed   OT Treatment/Interventions Self-care/ADL training;Therapeutic  exercise;Patient/family education;Therapeutic activities;Electrical Stimulation;Moist Heat;Cryotherapy;Passive range of motion;Scar mobilization   Plan Continue OT services for additional month.  Add wall wash.          G-Codes - 05-01-14 1540    Functional Assessment Tool Used FOTO score: 35/100 (65% lmited); Previous 44/100 (56% impaired)   Functional Limitation Carrying, moving and handling objects   Carrying, Moving and Handling Objects Current Status [phone]) At least 60 percent but less than 80 percent impaired, limited or restricted   Carrying, Moving and Handling Objects Goal Status ([id number]) At least 20 percent but less than 40 percent impaired, limited or restricted      Problem List Patient Active Problem List   Diagnosis Date Noted  . Rotator cuff tear arthropathy of right shoulder 03/12/2014  . S/P shoulder replacement 03/11/2014  . Chest discomfort 04/20/2013  . Esophageal reflux 09/07/2012  . Essential hypertension, benign 03/06/2011  . Mixed hyperlipidemia 03/06/2011  . Impaired glucose tolerance 03/06/2011  . Asthma 03/06/2011    Bea Graff , MS, OTR/L [place] [phone] 2014/05/01, 3:49 PM  [place] [address] [place], [place], [address] Phone: [phone]   Fax:  [phone]

## 2014-05-04 ENCOUNTER — Ambulatory Visit (HOSPITAL_COMMUNITY)
Admission: RE | Admit: 2014-05-04 | Discharge: 2014-05-04 | Disposition: A | Discharge status: Home / Self Care | Payer: Medicare Other | Source: Ambulatory Visit | Attending: Family Medicine | Admitting: Family Medicine

## 2014-05-04 ENCOUNTER — Encounter (HOSPITAL_COMMUNITY): Payer: Self-pay

## 2014-05-04 DIAGNOSIS — Z471 Aftercare following joint replacement surgery: Secondary | ICD-10-CM | POA: Diagnosis not present

## 2014-05-04 DIAGNOSIS — R531 Weakness: Secondary | ICD-10-CM

## 2014-05-04 DIAGNOSIS — M25611 Stiffness of right shoulder, not elsewhere classified: Secondary | ICD-10-CM

## 2014-05-04 DIAGNOSIS — Z96611 Presence of right artificial shoulder joint: Secondary | ICD-10-CM

## 2014-05-04 NOTE — Therapy (Signed)
[place], [place], [address] Phone: [phone]   Fax:  [phone]  Occupational Therapy Treatment  Patient Details  Name: Breanna Mason MRN: [id number] Date of Birth: Jul 22, 1945  Encounter Date: 05/04/2014      OT End of Session - 05/04/14 1554    Visit Number 10   Number of Visits 24   Date for OT Re-Evaluation 04/28/14   Authorization Type Medicare Part A   Authorization Time Period Before 19th visit   Authorization - Visit Number 10   Authorization - Number of Visits 19   OT Start Time 1510   OT Stop Time 1553   OT Time Calculation (min) 43 min   Activity Tolerance Patient tolerated treatment well   Behavior During Therapy [facility] for tasks assessed/performed      Past Medical History  Diagnosis Date  . Essential hypertension, benign   . GERD (gastroesophageal reflux disease)   . Asthma   . Mixed hyperlipidemia   . Impaired glucose tolerance   . Osteoporosis   . Anxiety   . Osteopenia   . Reflux   . IFG (impaired fasting glucose)   . Allergy   . PONV (postoperative nausea and vomiting)   . Pneumonia     hx  . Arthritis     Past Surgical History  Procedure Laterality Date  . Cesarean section    . Bilateral tubal ligation    . Shoulder surgery Right     x4  . Colonoscopy N/A 09/17/2013    Procedure: COLONOSCOPY;  Surgeon: Rogene Houston, MD;  [facility];  Service: Endoscopy;  Laterality: N/A;  1200  . Tubal ligation    . Carpal tunnel release Right     30 yrs  . Reverse shoulder arthroplasty Right 03/11/2014    Procedure: RIGHT REVERSE SHOULDER ARTHROPLASTY;  Surgeon: Marin Shutter, MD;  Location: [place];  Service: Orthopedics;  Laterality: Right;    There were no vitals taken for this visit.  Visit Diagnosis:  S/p reverse total shoulder arthroplasty, right  Decreased range of motion of right shoulder  Weakness generalized      Subjective Assessment - 05/04/14  1509    Symptoms "my shouldern's hasn't given me a bit of problem... but this bronchitis..."   Limitations Supple Protocol: weeks 4-8 (12/3 - 12/31): AAROM and AROM in all planes - do not aggressively push ROM!  Weeks 8-12 (12/31- 1/28) PROM and AROM to tolerance, sidelying, gradual resistance   Currently in Pain? No/denies          [facility] OT Assessment - 05/04/14 0001    Precautions   Type of Shoulder Precautions PROM, progress to AAROM then AROM.                OT Treatments/Exercises (OP) - 05/04/14 1512    Shoulder Exercises: Supine   Protraction PROM;5 reps;AROM;12 reps   Horizontal ABduction PROM;5 reps;AROM;12 reps   External Rotation PROM;5 reps;AROM;12 reps  finger tip assist for AROM   Internal Rotation PROM;5 reps;AROM;12 reps   Flexion PROM;5 reps;AROM;12 reps   ABduction PROM;5 reps;AROM;12 reps   Shoulder Exercises: Standing   Protraction AAROM;12 reps   Horizontal ABduction AAROM;12 reps   External Rotation AAROM;12 reps   Internal Rotation AAROM;12 reps   Flexion AAROM;12 reps   ABduction AAROM;12 reps   Extension Theraband;12 reps   Theraband Level (Shoulder Extension) Level 2 (Red)   Row Theraband;12 reps  Theraband Level (Shoulder Row) Level 2 (Red)   Other Standing Exercises Finger ladder to #13. 5 reps, with cueing to slide hand down, rather tahn letting it drop.   Shoulder Exercises: ROM/Strengthening   Thumb Tacks 1 min   Proximal Shoulder Strengthening, Supine 12x   Proximal Shoulder Strengthening, Seated 10X   Prot/Ret//Elev/Dep 1 min   Manual Therapy   Manual Therapy Myofascial release   Myofascial Release MFR and manual stretching to RUE bicpe, upper arm, trap, and scap regions to decrease fascial restrictions and promote deccreased pain.                    OT Short Term Goals - 04/28/14 1511    OT SHORT TERM GOAL #1   Title Patient will be educated on HEP.   Status Achieved   OT SHORT TERM GOAL #2   Title Patient will  increase PROM of right shoulder to [facility] to increase ability to perform dressing tasks with less difficulty.   Status Achieved   OT SHORT TERM GOAL #3   Title Patient will report a pain level of 1/10 or less with daily tasks.   Status On-going   OT SHORT TERM GOAL #4   Title Patient will increase right shoulder strength to 3/5 to increase ability to wash hair.    Status On-going   OT SHORT TERM GOAL #5   Title Patient will decrease fascial restrictions from max to mod amount.   Status Achieved           OT Long Term Goals - 04/28/14 1514    OT LONG TERM GOAL #1   Title Patient will return to highest level of independence with BADL and leisure tasks.    Status On-going   OT LONG TERM GOAL #2   Title Patient will increase AROM of right shoulder to [facility] to increase ability reach into overhead cabinets with less difficulty.   Status On-going   OT LONG TERM GOAL #3   Title Patient will increase right shoulder strength 4/5 to increase ability to complete daily tasks with less difficulty.    Status On-going   OT LONG TERM GOAL #4   Title Patient will decrease fascial restrictions from mod to min amount.   Status On-going               Plan - 05/04/14 1554    Clinical Impression Statement Pt arrived bring new order from MD, for additional 4 weeks of therapy.  continued supine MFR and PROM. incrased supine AROM to 12.  During standing AAROM, encouraged pt to complete as much with RUE prior to letting LUE assist. Added wall wash. and pt tolerated well. Demonstrated good form with theraband, with cuing.  Added fingerladder - pt was able to reqach 13th hold.  Cueing requried for decreased trunk extension.       Plan Increase supine AROM reps.        Problem List Patient Active Problem List   Diagnosis Date Noted  . Rotator cuff tear arthropathy of right shoulder 03/12/2014  . S/P shoulder replacement 03/11/2014  . Chest discomfort 04/20/2013  . Esophageal reflux 09/07/2012  .  Essential hypertension, benign 03/06/2011  . Mixed hyperlipidemia 03/06/2011  . Impaired glucose tolerance 03/06/2011  . Asthma 03/06/2011    Bea Graff , MS, OTR/L [place] [phone] 05/04/2014, 3:56 PM  [place] [address] [place], [place], [address] Phone: [phone]   Fax:  [phone]

## 2014-05-05 ENCOUNTER — Encounter: Payer: Self-pay | Admitting: Family Medicine

## 2014-05-05 ENCOUNTER — Ambulatory Visit (INDEPENDENT_AMBULATORY_CARE_PROVIDER_SITE_OTHER): Payer: Medicare Other | Admitting: Family Medicine

## 2014-05-05 VITALS — BP 134/86 | Temp 98.2°F | Ht 63.0 in | Wt 149.0 lb

## 2014-05-05 DIAGNOSIS — R05 Cough: Secondary | ICD-10-CM | POA: Diagnosis not present

## 2014-05-05 DIAGNOSIS — J019 Acute sinusitis, unspecified: Secondary | ICD-10-CM

## 2014-05-05 DIAGNOSIS — B9689 Other specified bacterial agents as the cause of diseases classified elsewhere: Secondary | ICD-10-CM

## 2014-05-05 DIAGNOSIS — R059 Cough, unspecified: Secondary | ICD-10-CM

## 2014-05-05 MED ORDER — ALBUTEROL SULFATE HFA 108 (90 BASE) MCG/ACT IN AERS: 2.0000 | INHALATION_SPRAY | Freq: Four times a day (QID) | RESPIRATORY_TRACT | Status: DC | PRN

## 2014-05-05 MED ORDER — CEFPROZIL 500 MG PO TABS: 500.0000 mg | ORAL_TABLET | Freq: Two times a day (BID) | ORAL | Status: DC

## 2014-05-05 NOTE — Progress Notes (Signed)
   Subjective:    Patient ID: Breanna Mason, female    DOB: Apr 21, 1946, 68 y.o.   MRN: [id number]  Cough This is a new problem. The current episode started in the past 7 days. Associated symptoms include shortness of breath. Associated symptoms comments: congestion. Treatments tried: delsym, tylenol. The treatment provided no relief.   Patient states started up about a week ago then progressed into sinus pressure drainage coughing she states she coughs so hard she gets a little short of breath but otherwise she denies being short of breath   Review of Systems  Respiratory: Positive for cough and shortness of breath.        Objective:   Physical Exam There is no sign of any type and pneumonia going on her lungs are clear neck supple mild sinus tenderness eardrums normal patient not toxic       Assessment & Plan:  Acute bronchitis Viral syndrome Acute rhinosinusitis Antibiotics prescribed.

## 2014-05-10 ENCOUNTER — Ambulatory Visit (HOSPITAL_COMMUNITY)
Admission: RE | Admit: 2014-05-10 | Discharge: 2014-05-10 | Disposition: A | Discharge status: Home / Self Care | Payer: Medicare Other | Source: Ambulatory Visit | Attending: Family Medicine | Admitting: Family Medicine

## 2014-05-10 ENCOUNTER — Encounter (HOSPITAL_COMMUNITY): Payer: Self-pay

## 2014-05-10 DIAGNOSIS — R531 Weakness: Secondary | ICD-10-CM | POA: Diagnosis not present

## 2014-05-10 DIAGNOSIS — M25611 Stiffness of right shoulder, not elsewhere classified: Secondary | ICD-10-CM | POA: Insufficient documentation

## 2014-05-10 DIAGNOSIS — Z96611 Presence of right artificial shoulder joint: Secondary | ICD-10-CM | POA: Insufficient documentation

## 2014-05-10 DIAGNOSIS — Z471 Aftercare following joint replacement surgery: Secondary | ICD-10-CM | POA: Diagnosis not present

## 2014-05-10 NOTE — Patient Instructions (Signed)
Complete laying down for now - transition to standing as able.  Continue dowel exercises in standing until ready for active motion. Complete 15 reps of each laying down.  Not pictured: -punches forward -holding arms in front of you and open to each side   ROM: Abduction (Standing)   Bring arms straight out from sides and raise as high as possible without pain.  http://orth.exer.us/910    ROM: External / Internal Rotation - in Abduction (Standing)   With upper arms parallel to floor and elbows bent at right angles, gently rotate arms up then down as far as possible without pain.  **Alternate completed in therapy - keep elbow bent and close to your sides, rotating hands and forearms in towards stomach and out to side again.**  http://orth.exer.us/912   Copyright  VHI. All rights reserved.    Flexors Stretch (Active)   Stand, arms straight at sides. Bring arms straight forward and upward as high as possible without pain.   Copyright  VHI. All rights reserved.    Bea Graff , MS, OTR/L [place]

## 2014-05-10 NOTE — Therapy (Signed)
[place], [place], [address] Phone: [phone]   Fax:  [phone]  Occupational Therapy Treatment  Patient Details  Name: Breanna Mason MRN: [id number] Date of Birth: [birth year]  Encounter Date: 05/10/2014      OT End of Session - 05/10/14 1151    Visit Number 11   Number of Visits 24   Date for OT Re-Evaluation 04/28/14   Authorization Type Medicare Part A   Authorization Time Period Before 19th visit   Authorization - Visit Number 11   Authorization - Number of Visits 19   OT Start Time 1022   OT Stop Time 1100   OT Time Calculation (min) 38 min   Activity Tolerance Patient tolerated treatment well   Behavior During Therapy [facility] for tasks assessed/performed      Past Medical History  Diagnosis Date  . Essential hypertension, benign   . GERD (gastroesophageal reflux disease)   . Asthma   . Mixed hyperlipidemia   . Impaired glucose tolerance   . Osteoporosis   . Anxiety   . Osteopenia   . Reflux   . IFG (impaired fasting glucose)   . Allergy   . PONV (postoperative nausea and vomiting)   . Pneumonia     hx  . Arthritis     Past Surgical History  Procedure Laterality Date  . Cesarean section    . Bilateral tubal ligation    . Shoulder surgery Right     x4  . Colonoscopy N/A 09/17/2013    Procedure: COLONOSCOPY;  Surgeon: Rogene Houston, MD;  [facility];  Service: Endoscopy;  Laterality: N/A;  1200  . Tubal ligation    . Carpal tunnel release Right     30 yrs  . Reverse shoulder arthroplasty Right 03/11/2014    Procedure: RIGHT REVERSE SHOULDER ARTHROPLASTY;  Surgeon: Marin Shutter, MD;  Location: [place];  Service: Orthopedics;  Laterality: Right;    There were no vitals taken for this visit.  Visit Diagnosis:  S/p reverse total shoulder arthroplasty, right  Decreased range of motion of right shoulder  Weakness generalized      Subjective Assessment - 05/10/14  1024    Symptoms "i think I caught myself jerking it too much this weekend."   Limitations Supple Protocol: weeks 4-8 (12/3 - 12/31): AAROM and AROM in all planes - do not aggressively push ROM!  Weeks 8-12 (12/31- 1/28) PROM and AROM to tolerance, sidelying, gradual resistance   Currently in Pain? No/denies          [facility] OT Assessment - 05/10/14 0001    Precautions   Type of Shoulder Precautions PROM, progress to AAROM then AROM.                OT Treatments/Exercises (OP) - 05/10/14 1025    Shoulder Exercises: Supine   Protraction PROM;5 reps;AROM;15 reps   Horizontal ABduction PROM;5 reps;AROM;15 reps   External Rotation PROM;5 reps;AROM;15 reps  fingertip assist   Internal Rotation PROM;5 reps;AROM;15 reps   Flexion PROM;5 reps;AROM;15 reps   ABduction PROM;5 reps;AROM;15 reps   Shoulder Exercises: Standing   Protraction AAROM;15 reps   Horizontal ABduction AAROM;15 reps   External Rotation AAROM;15 reps   Internal Rotation AAROM;15 reps   Flexion AAROM;15 reps   ABduction AAROM;12 reps   Extension Theraband;12 reps   Theraband Level (Shoulder Extension) Level 2 (Red)   Row Theraband;12 reps   Theraband Level (Shoulder Row)  Level 2 (Red)   Other Standing Exercises Finger ladder to #13. 3 reps.   Shoulder Exercises: ROM/Strengthening   Wall Wash 1:30   Proximal Shoulder Strengthening, Supine 15x   Manual Therapy   Manual Therapy Myofascial release   Myofascial Release MFR and manual stretching to RUE bicpe, upper arm, trap, and scap regions to decrease fascial restrictions and promote deccreased pain.                  OT Education - 05/10/14 1151    Education provided Yes   Education Details supine AROM   Person(s) Educated Patient   Methods Demonstration;Handout;Explanation   Comprehension Verbalized understanding;Returned demonstration          OT Short Term Goals - 04/28/14 1511    OT SHORT TERM GOAL #1   Title Patient will be educated on  HEP.   Status Achieved   OT SHORT TERM GOAL #2   Title Patient will increase PROM of right shoulder to [facility] to increase ability to perform dressing tasks with less difficulty.   Status Achieved   OT SHORT TERM GOAL #3   Title Patient will report a pain level of 1/10 or less with daily tasks.   Status On-going   OT SHORT TERM GOAL #4   Title Patient will increase right shoulder strength to 3/5 to increase ability to wash hair.    Status On-going   OT SHORT TERM GOAL #5   Title Patient will decrease fascial restrictions from max to mod amount.   Status Achieved           OT Long Term Goals - 04/28/14 1514    OT LONG TERM GOAL #1   Title Patient will return to highest level of independence with BADL and leisure tasks.    Status On-going   OT LONG TERM GOAL #2   Title Patient will increase AROM of right shoulder to [facility] to increase ability reach into overhead cabinets with less difficulty.   Status On-going   OT LONG TERM GOAL #3   Title Patient will increase right shoulder strength 4/5 to increase ability to complete daily tasks with less difficulty.    Status On-going   OT LONG TERM GOAL #4   Title Patient will decrease fascial restrictions from mod to min amount.   Status On-going               Plan - 05/10/14 1152    Clinical Impression Statement increased supine AROM reps to 15, witih good tolerance. Pt continued to have increased difficulty with ER - OTR provided fingertip facilitation.  Pt continues to tolerate well wall wash and finger ladder.  Good form with red theraband this session. Provided pt with updated HEP for supine AROM exercises.     Plan Attempt 1# supine.   OT Home Exercise Plan Table slides  12/15 added deltoid strengthening prayer     1/4 Added supine AROM        Problem List Patient Active Problem List   Diagnosis Date Noted  . Rotator cuff tear arthropathy of right shoulder 03/12/2014  . S/P shoulder replacement 03/11/2014  . Chest  discomfort 04/20/2013  . Esophageal reflux 09/07/2012  . Essential hypertension, benign 03/06/2011  . Mixed hyperlipidemia 03/06/2011  . Impaired glucose tolerance 03/06/2011  . Asthma 03/06/2011    Bea Graff , MS, OTR/L [place] [phone] 05/10/2014, 11:54 AM  [place],  [place], [address] Phone: [phone]   Fax:  [phone]

## 2014-05-12 ENCOUNTER — Ambulatory Visit (HOSPITAL_COMMUNITY)
Admission: RE | Admit: 2014-05-12 | Discharge: 2014-05-12 | Disposition: A | Discharge status: Home / Self Care | Payer: Medicare Other | Source: Ambulatory Visit | Attending: Family Medicine | Admitting: Family Medicine

## 2014-05-12 DIAGNOSIS — M25611 Stiffness of right shoulder, not elsewhere classified: Secondary | ICD-10-CM

## 2014-05-12 DIAGNOSIS — Z471 Aftercare following joint replacement surgery: Secondary | ICD-10-CM | POA: Diagnosis not present

## 2014-05-12 DIAGNOSIS — R531 Weakness: Secondary | ICD-10-CM | POA: Diagnosis not present

## 2014-05-12 DIAGNOSIS — Z96611 Presence of right artificial shoulder joint: Secondary | ICD-10-CM | POA: Diagnosis not present

## 2014-05-12 NOTE — Therapy (Signed)
[place], [place], [address] Phone: [phone]   Fax:  [phone]  Occupational Therapy Treatment  Patient Details  Name: Breanna Mason MRN: [id number] Date of Birth: [birth year]  Encounter Date: 05/12/2014      OT End of Session - 05/12/14 1128    Visit Number 12   Number of Visits 24   Date for OT Re-Evaluation 05/26/14   Authorization Type Medicare Part A   Authorization Time Period Before 19th visit   Authorization - Visit Number 12   Authorization - Number of Visits 104   OT Start Time 1025   OT Stop Time 1103   OT Time Calculation (min) 38 min   Activity Tolerance Patient tolerated treatment well      Past Medical History  Diagnosis Date  . Essential hypertension, benign   . GERD (gastroesophageal reflux disease)   . Asthma   . Mixed hyperlipidemia   . Impaired glucose tolerance   . Osteoporosis   . Anxiety   . Osteopenia   . Reflux   . IFG (impaired fasting glucose)   . Allergy   . PONV (postoperative nausea and vomiting)   . Pneumonia     hx  . Arthritis     Past Surgical History  Procedure Laterality Date  . Cesarean section    . Bilateral tubal ligation    . Shoulder surgery Right     x4  . Colonoscopy N/A 09/17/2013    Procedure: COLONOSCOPY;  Surgeon: Rogene Houston, MD;  [facility];  Service: Endoscopy;  Laterality: N/A;  1200  . Tubal ligation    . Carpal tunnel release Right     30 yrs  . Reverse shoulder arthroplasty Right 03/11/2014    Procedure: RIGHT REVERSE SHOULDER ARTHROPLASTY;  Surgeon: Marin Shutter, MD;  Location: [facility];  Service: Orthopedics;  Laterality: Right;    There were no vitals taken for this visit.  Visit Diagnosis:  S/p reverse total shoulder arthroplasty, right  Decreased range of motion of right shoulder  Weakness generalized      Subjective Assessment - 05/12/14 1022    Symptoms S:  If it gets sore, I put heat and ice on  it.  I mopped my hardwood floors it felt ok.   Limitations Supple Protocol: weeks 4-8 (12/3 - 12/31): AAROM and AROM in all planes - do not aggressively push ROM!  Weeks 8-12 (12/31- 1/28) PROM and AROM to tolerance, sidelying, gradual resistance   Currently in Pain? No/denies   Pain Score 0-No pain          [facility] OT Assessment - 05/12/14 1023    Assessment   Diagnosis s/p right shoulder joint reverse joint replacement   Precautions   Precautions Shoulder   Type of Shoulder Precautions PROM, progress to AAROM then AROM.                OT Treatments/Exercises (OP) - 05/12/14 1024    Exercises   Exercises Shoulder   Shoulder Exercises: Supine   Protraction PROM;Strengthening;10 reps   Protraction Weight (lbs) 1   Horizontal ABduction PROM;Strengthening;10 reps   Horizontal ABduction Weight (lbs) 1   External Rotation PROM;Strengthening;10 reps   External Rotation Weight (lbs) 1   Internal Rotation PROM;Strengthening;10 reps   Internal Rotation Weight (lbs) 1   Flexion PROM;Strengthening;10 reps   Shoulder Flexion Weight (lbs) 1   ABduction PROM;Strengthening;10 reps   Shoulder ABduction Weight (lbs)  1   Shoulder Exercises: Sidelying   External Rotation AAROM   External Rotation Limitations hand held assist from OT   Internal Rotation AROM;10 reps   Flexion AROM;10 reps   ABduction AROM;10 reps   Shoulder Exercises: Therapy Ball   Flexion --   ABduction --   Right/Left 5 reps   Right/Left Limitations therapist assisted with 4/5 attempts each direction   Shoulder Exercises: ROM/Strengthening   Wall Wash attempted 2 minutes could only complete 1 min and 30 seconds   Proximal Shoulder Strengthening, Supine 10 times without weight   Manual Therapy   Manual Therapy Myofascial release   Myofascial Release MFR and manual stretching to RUE bicpe, upper arm, trap, and scap regions to decrease fascial restrictions and promote deccreased pain.                   OT Short Term Goals - 04/28/14 1511    OT SHORT TERM GOAL #1   Title Patient will be educated on HEP.   Status Achieved   OT SHORT TERM GOAL #2   Title Patient will increase PROM of right shoulder to [facility] to increase ability to perform dressing tasks with less difficulty.   Status Achieved   OT SHORT TERM GOAL #3   Title Patient will report a pain level of 1/10 or less with daily tasks.   Status On-going   OT SHORT TERM GOAL #4   Title Patient will increase right shoulder strength to 3/5 to increase ability to wash hair.    Status On-going   OT SHORT TERM GOAL #5   Title Patient will decrease fascial restrictions from max to mod amount.   Status Achieved           OT Long Term Goals - 04/28/14 1514    OT LONG TERM GOAL #1   Title Patient will return to highest level of independence with BADL and leisure tasks.    Status On-going   OT LONG TERM GOAL #2   Title Patient will increase AROM of right shoulder to [facility] to increase ability reach into overhead cabinets with less difficulty.   Status On-going   OT LONG TERM GOAL #3   Title Patient will increase right shoulder strength 4/5 to increase ability to complete daily tasks with less difficulty.    Status On-going   OT LONG TERM GOAL #4   Title Patient will decrease fascial restrictions from mod to min amount.   Status On-going               Plan - 05/12/14 1128    Clinical Impression Statement A:  added 1# resistance to exercises in supine.  Patent required assistance with all exercises into external rotation past neutral.  added sidelying AROM.   Plan P:  Attempt to externally rotate past neutral independently.  complete ball circles without assistance.        Problem List Patient Active Problem List   Diagnosis Date Noted  . Rotator cuff tear arthropathy of right shoulder 03/12/2014  . S/P shoulder replacement 03/11/2014  . Chest discomfort 04/20/2013  . Esophageal reflux 09/07/2012  . Essential  hypertension, benign 03/06/2011  . Mixed hyperlipidemia 03/06/2011  . Impaired glucose tolerance 03/06/2011  . Asthma 03/06/2011    Vangie Bicker, OTR/L [phone]  05/12/2014, 11:33 AM  [place] [address] [place], [place], [address] Phone: [phone]   Fax:  [phone]

## 2014-05-17 ENCOUNTER — Encounter (HOSPITAL_COMMUNITY): Payer: Self-pay

## 2014-05-17 ENCOUNTER — Ambulatory Visit (HOSPITAL_COMMUNITY)
Admission: RE | Admit: 2014-05-17 | Discharge: 2014-05-17 | Disposition: A | Discharge status: Home / Self Care | Payer: Medicare Other | Source: Ambulatory Visit | Attending: Family Medicine | Admitting: Family Medicine

## 2014-05-17 DIAGNOSIS — M25611 Stiffness of right shoulder, not elsewhere classified: Secondary | ICD-10-CM

## 2014-05-17 DIAGNOSIS — Z96611 Presence of right artificial shoulder joint: Secondary | ICD-10-CM

## 2014-05-17 DIAGNOSIS — Z471 Aftercare following joint replacement surgery: Secondary | ICD-10-CM | POA: Diagnosis not present

## 2014-05-17 DIAGNOSIS — R531 Weakness: Secondary | ICD-10-CM | POA: Diagnosis not present

## 2014-05-17 NOTE — Therapy (Signed)
[place], [place], [address] Phone: [phone]   Fax:  [phone]  Occupational Therapy Treatment  Patient Details  Name: Breanna Mason MRN: [id number] Date of Birth: 1946/04/08 Referring Provider:  Mikey Kirschner, MD  Encounter Date: 05/17/2014      OT End of Session - 05/17/14 1104    Visit Number 13   Number of Visits 24   Date for OT Re-Evaluation 05/26/14   Authorization Type Medicare Part A   Authorization Time Period Before 19th visit   Authorization - Visit Number 54   Authorization - Number of Visits 19   OT Start Time 1021   OT Stop Time 1101   OT Time Calculation (min) 40 min   Activity Tolerance Patient tolerated treatment well   Behavior During Therapy [facility] for tasks assessed/performed      Past Medical History  Diagnosis Date  . Essential hypertension, benign   . GERD (gastroesophageal reflux disease)   . Asthma   . Mixed hyperlipidemia   . Impaired glucose tolerance   . Osteoporosis   . Anxiety   . Osteopenia   . Reflux   . IFG (impaired fasting glucose)   . Allergy   . PONV (postoperative nausea and vomiting)   . Pneumonia     hx  . Arthritis     Past Surgical History  Procedure Laterality Date  . Cesarean section    . Bilateral tubal ligation    . Shoulder surgery Right     x4  . Colonoscopy N/A 09/17/2013    Procedure: COLONOSCOPY;  Surgeon: Rogene Houston, MD;  [facility];  Service: Endoscopy;  Laterality: N/A;  1200  . Tubal ligation    . Carpal tunnel release Right     30 yrs  . Reverse shoulder arthroplasty Right 03/11/2014    Procedure: RIGHT REVERSE SHOULDER ARTHROPLASTY;  Surgeon: Marin Shutter, MD;  Location: [facility];  Service: Orthopedics;  Laterality: Right;    There were no vitals taken for this visit.  Visit Diagnosis:  S/p reverse total shoulder arthroplasty, right  Decreased range of motion of right shoulder  Weakness  generalized      Subjective Assessment - 05/17/14 1022    Symptoms "i've been doing exercises on it all weekend."   Limitations Supple Protocol: weeks 4-8 (12/3 - 12/31): AAROM and AROM in all planes - do not aggressively push ROM!  Weeks 8-12 (12/31- 1/28) PROM and AROM to tolerance, sidelying, gradual resistance   Currently in Pain? No/denies          [facility] OT Assessment - 05/17/14 0001    Precautions   Type of Shoulder Precautions PROM, progress to AAROM then AROM.                OT Treatments/Exercises (OP) - 05/17/14 1023    Shoulder Exercises: Supine   Protraction PROM;5 reps;Strengthening;12 reps   Protraction Weight (lbs) 1   Horizontal ABduction PROM;5 reps;Strengthening;12 reps   Horizontal ABduction Weight (lbs) 1   External Rotation PROM;5 reps;Strengthening;12 reps   External Rotation Weight (lbs) 1   Internal Rotation PROM;5 reps;Strengthening;12 reps   Internal Rotation Weight (lbs) 1   Flexion PROM;5 reps;Strengthening;12 reps   Shoulder Flexion Weight (lbs) 1   ABduction PROM;5 reps;Strengthening;12 reps   Shoulder ABduction Weight (lbs) 1   Shoulder Exercises: Standing   Protraction AAROM;15 reps   Horizontal ABduction AAROM;15 reps   External Rotation AAROM;15  reps   Internal Rotation AAROM;15 reps   Flexion AAROM;15 reps   ABduction AAROM;12 reps   Shoulder Exercises: Therapy Ball   Right/Left 5 reps   Shoulder Exercises: ROM/Strengthening   Proximal Shoulder Strengthening, Supine 12x with 1#   Shoulder Exercises: Isometric Strengthening   External Rotation 3X5";Supine   Shoulder Exercises: Stretch   Other Shoulder Stretches wall ER stretch - 3 reps with 10 second holds   Manual Therapy   Manual Therapy Myofascial release   Myofascial Release MFR and manual stretching to RUE bicpe, upper arm, trap, and scap regions to decrease fascial restrictions and promote deccreased pain.                  OT Education - 05/17/14 1103     Education provided Yes   Education Details wall ER stretch   Person(s) Educated Patient   Methods Explanation;Demonstration   Comprehension Verbalized understanding;Returned demonstration          OT Short Term Goals - 04/28/14 1511    OT SHORT TERM GOAL #1   Title Patient will be educated on HEP.   Status Achieved   OT SHORT TERM GOAL #2   Title Patient will increase PROM of right shoulder to [facility] to increase ability to perform dressing tasks with less difficulty.   Status Achieved   OT SHORT TERM GOAL #3   Title Patient will report a pain level of 1/10 or less with daily tasks.   Status On-going   OT SHORT TERM GOAL #4   Title Patient will increase right shoulder strength to 3/5 to increase ability to wash hair.    Status On-going   OT SHORT TERM GOAL #5   Title Patient will decrease fascial restrictions from max to mod amount.   Status Achieved           OT Long Term Goals - 04/28/14 1514    OT LONG TERM GOAL #1   Title Patient will return to highest level of independence with BADL and leisure tasks.    Status On-going   OT LONG TERM GOAL #2   Title Patient will increase AROM of right shoulder to [facility] to increase ability reach into overhead cabinets with less difficulty.   Status On-going   OT LONG TERM GOAL #3   Title Patient will increase right shoulder strength 4/5 to increase ability to complete daily tasks with less difficulty.    Status On-going   OT LONG TERM GOAL #4   Title Patient will decrease fascial restrictions from mod to min amount.   Status On-going               Plan - 05/17/14 1104    Clinical Impression Statement Increased supine resp to 12 with 1#, and pt tolerated well. continued 15 reps with dowel in standing - encouraged pt tol allow R arm to complete as much of stretch as possible without assist from LUE.  Educated pt on ER wall stretch or HEP - pt verbalized understanding and demonstrated good form.  continued ball R/L - pt  demonstrated improved tolerance and ability this session.  Attempted ER isometric exercises - pt had minimal contraction of muscle.   Plan Follow up on ER wall stretch. Re-attempt ER isometric (with shoulder in sligh abduction).  Attempt standing AROM.        Problem List Patient Active Problem List   Diagnosis Date Noted  . Rotator cuff tear arthropathy of right shoulder 03/12/2014  . S/P  shoulder replacement 03/11/2014  . Chest discomfort 04/20/2013  . Esophageal reflux 09/07/2012  . Essential hypertension, benign 03/06/2011  . Mixed hyperlipidemia 03/06/2011  . Impaired glucose tolerance 03/06/2011  . Asthma 03/06/2011    Bea Graff , MS, OTR/L [place] [phone] 05/17/2014, 11:08 AM   [place], [place], [address] Phone: [phone]   Fax:  [phone]

## 2014-05-19 ENCOUNTER — Encounter (HOSPITAL_COMMUNITY): Payer: Self-pay

## 2014-05-19 ENCOUNTER — Ambulatory Visit (HOSPITAL_COMMUNITY)
Admission: RE | Admit: 2014-05-19 | Discharge: 2014-05-19 | Disposition: A | Discharge status: Home / Self Care | Payer: Medicare Other | Source: Ambulatory Visit | Attending: Family Medicine | Admitting: Family Medicine

## 2014-05-19 DIAGNOSIS — Z96611 Presence of right artificial shoulder joint: Secondary | ICD-10-CM | POA: Diagnosis not present

## 2014-05-19 DIAGNOSIS — R531 Weakness: Secondary | ICD-10-CM

## 2014-05-19 DIAGNOSIS — M25611 Stiffness of right shoulder, not elsewhere classified: Secondary | ICD-10-CM

## 2014-05-19 DIAGNOSIS — Z471 Aftercare following joint replacement surgery: Secondary | ICD-10-CM | POA: Diagnosis not present

## 2014-05-19 NOTE — Therapy (Signed)
[place], [place], [address] Phone: [phone]   Fax:  [phone]  Occupational Therapy Treatment  Patient Details  Name: Breanna Mason MRN: [id number] Date of Birth: 10/16/1945 Referring Provider:  Mikey Kirschner, MD  Encounter Date: 05/19/2014      OT End of Session - 05/19/14 1107    Visit Number 14   Number of Visits 24   Date for OT Re-Evaluation 05/26/14   Authorization Type Medicare Part A   Authorization Time Period Before 19th visit   Authorization - Visit Number 48   Authorization - Number of Visits 19   OT Start Time 1025   OT Stop Time 1105   OT Time Calculation (min) 40 min   Activity Tolerance Patient tolerated treatment well   Behavior During Therapy [facility] for tasks assessed/performed      Past Medical History  Diagnosis Date  . Essential hypertension, benign   . GERD (gastroesophageal reflux disease)   . Asthma   . Mixed hyperlipidemia   . Impaired glucose tolerance   . Osteoporosis   . Anxiety   . Osteopenia   . Reflux   . IFG (impaired fasting glucose)   . Allergy   . PONV (postoperative nausea and vomiting)   . Pneumonia     hx  . Arthritis     Past Surgical History  Procedure Laterality Date  . Cesarean section    . Bilateral tubal ligation    . Shoulder surgery Right     x4  . Colonoscopy N/A 09/17/2013    Procedure: COLONOSCOPY;  Surgeon: Rogene Houston, MD;  [facility];  Service: Endoscopy;  Laterality: N/A;  1200  . Tubal ligation    . Carpal tunnel release Right     30 yrs  . Reverse shoulder arthroplasty Right 03/11/2014    Procedure: RIGHT REVERSE SHOULDER ARTHROPLASTY;  Surgeon: Marin Shutter, MD;  Location: [facility];  Service: Orthopedics;  Laterality: Right;    There were no vitals taken for this visit.  Visit Diagnosis:  S/p reverse total shoulder arthroplasty, right  Decreased range of motion of right shoulder  Weakness  generalized      Subjective Assessment - 05/19/14 1025    Symptoms "I don't even take the pain pills no more!"   Limitations Supple Protocol: weeks 4-8 (12/3 - 12/31): AAROM and AROM in all planes - do not aggressively push ROM!  Weeks 8-12 (12/31- 1/28) PROM and AROM to tolerance, sidelying, gradual resistance   Currently in Pain? No/denies          [facility] OT Assessment - 05/19/14 0001    Precautions   Type of Shoulder Precautions PROM, progress to AAROM then AROM.                OT Treatments/Exercises (OP) - 05/19/14 1026    Shoulder Exercises: Supine   Protraction PROM;5 reps;Strengthening;12 reps   Protraction Weight (lbs) 1   Horizontal ABduction PROM;5 reps;Strengthening;12 reps   Horizontal ABduction Weight (lbs) 1   External Rotation PROM;5 reps;Strengthening;12 reps   External Rotation Weight (lbs) 1   Internal Rotation PROM;5 reps;Strengthening;12 reps   Internal Rotation Weight (lbs) 1   Flexion PROM;5 reps;Strengthening;12 reps   Shoulder Flexion Weight (lbs) 1   ABduction PROM;5 reps;Strengthening;12 reps   Shoulder ABduction Weight (lbs) 1   Shoulder Exercises: Standing   Protraction AROM;10 reps   Horizontal ABduction AROM;10 reps   External Rotation  AAROM;12 reps   Internal Rotation AAROM;15 reps   Flexion AROM;10 reps   ABduction AROM;10 reps   Shoulder Exercises: Therapy Ball   Right/Left 5 reps   Shoulder Exercises: ROM/Strengthening   Wall Wash 2 min   Proximal Shoulder Strengthening, Supine 12x with 1#   Shoulder Exercises: Isometric Strengthening   External Rotation 3X5";Supine  with arm in slight abduction   Manual Therapy   Manual Therapy Myofascial release   Myofascial Release MFR and manual stretching to RUE bicpe, upper arm, trap, and scap regions to decrease fascial restrictions and promote deccreased pain.                    OT Short Term Goals - 04/28/14 1511    OT SHORT TERM GOAL #1   Title Patient will be educated  on HEP.   Status Achieved   OT SHORT TERM GOAL #2   Title Patient will increase PROM of right shoulder to [facility] to increase ability to perform dressing tasks with less difficulty.   Status Achieved   OT SHORT TERM GOAL #3   Title Patient will report a pain level of 1/10 or less with daily tasks.   Status On-going   OT SHORT TERM GOAL #4   Title Patient will increase right shoulder strength to 3/5 to increase ability to wash hair.    Status On-going   OT SHORT TERM GOAL #5   Title Patient will decrease fascial restrictions from max to mod amount.   Status Achieved           OT Long Term Goals - 04/28/14 1514    OT LONG TERM GOAL #1   Title Patient will return to highest level of independence with BADL and leisure tasks.    Status On-going   OT LONG TERM GOAL #2   Title Patient will increase AROM of right shoulder to [facility] to increase ability reach into overhead cabinets with less difficulty.   Status On-going   OT LONG TERM GOAL #3   Title Patient will increase right shoulder strength 4/5 to increase ability to complete daily tasks with less difficulty.    Status On-going   OT LONG TERM GOAL #4   Title Patient will decrease fascial restrictions from mod to min amount.   Status On-going               Plan - 05/19/14 1107    Clinical Impression Statement Continued 12 reps with 1# this session, and pt tolerated well.  Placed R arm in slight abduction during ER isometric - pt had improved contracion of muscle.  Pt reports preforming wall ER stretch at home. Added standing exercises AROm this session - pt tolerated well.  continued Er/IR with dowel, for improved stretch into ER.  Pt able to complete full 2 min of wall wash this session, with increased cueing for upright posture with increased fatigue.     Plan Continue ER isometrics.  Add ER isometric to HEP. Increase 1# supine reps.        Problem List Patient Active Problem List   Diagnosis Date Noted  . Rotator cuff  tear arthropathy of right shoulder 03/12/2014  . S/P shoulder replacement 03/11/2014  . Chest discomfort 04/20/2013  . Esophageal reflux 09/07/2012  . Essential hypertension, benign 03/06/2011  . Mixed hyperlipidemia 03/06/2011  . Impaired glucose tolerance 03/06/2011  . Asthma 03/06/2011    Bea Graff , MS, OTR/L [facility] [phone] 05/19/2014, 11:09 AM  [place], [place], [address] Phone: [phone]   Fax:  [phone]

## 2014-05-24 ENCOUNTER — Encounter (HOSPITAL_COMMUNITY): Payer: Self-pay

## 2014-05-24 ENCOUNTER — Ambulatory Visit (HOSPITAL_COMMUNITY)
Admission: RE | Admit: 2014-05-24 | Discharge: 2014-05-24 | Disposition: A | Discharge status: Home / Self Care | Payer: Medicare Other | Source: Ambulatory Visit | Attending: Orthopedic Surgery | Admitting: Orthopedic Surgery

## 2014-05-24 DIAGNOSIS — M25611 Stiffness of right shoulder, not elsewhere classified: Secondary | ICD-10-CM | POA: Diagnosis not present

## 2014-05-24 DIAGNOSIS — Z96611 Presence of right artificial shoulder joint: Secondary | ICD-10-CM

## 2014-05-24 DIAGNOSIS — R531 Weakness: Secondary | ICD-10-CM | POA: Diagnosis not present

## 2014-05-24 DIAGNOSIS — Z471 Aftercare following joint replacement surgery: Secondary | ICD-10-CM | POA: Diagnosis not present

## 2014-05-24 NOTE — Therapy (Signed)
[place], [place], [address] Phone: [phone]   Fax:  [phone]  Occupational Therapy Treatment  Patient Details  Name: Breanna Mason MRN: [id number] Date of Birth: February 04, 1946 Referring Provider:  Marin Shutter, MD  Encounter Date: 05/24/2014      OT End of Session - 05/24/14 1101    Visit Number 15   Number of Visits 24   Date for OT Re-Evaluation 05/26/14   Authorization Type Medicare Part A   Authorization Time Period Before 19th visit   Authorization - Visit Number 56   Authorization - Number of Visits 19   OT Start Time 1019   OT Stop Time 1100   OT Time Calculation (min) 41 min   Activity Tolerance Patient tolerated treatment well   Behavior During Therapy [facility] for tasks assessed/performed      Past Medical History  Diagnosis Date  . Essential hypertension, benign   . GERD (gastroesophageal reflux disease)   . Asthma   . Mixed hyperlipidemia   . Impaired glucose tolerance   . Osteoporosis   . Anxiety   . Osteopenia   . Reflux   . IFG (impaired fasting glucose)   . Allergy   . PONV (postoperative nausea and vomiting)   . Pneumonia     hx  . Arthritis     Past Surgical History  Procedure Laterality Date  . Cesarean section    . Bilateral tubal ligation    . Shoulder surgery Right     x4  . Colonoscopy N/A 09/17/2013    Procedure: COLONOSCOPY;  Surgeon: Rogene Houston, MD;  [facility];  Service: Endoscopy;  Laterality: N/A;  1200  . Tubal ligation    . Carpal tunnel release Right     30 yrs  . Reverse shoulder arthroplasty Right 03/11/2014    Procedure: RIGHT REVERSE SHOULDER ARTHROPLASTY;  Surgeon: Marin Shutter, MD;  Location: [place];  Service: Orthopedics;  Laterality: Right;    There were no vitals taken for this visit.  Visit Diagnosis:  S/p reverse total shoulder arthroplasty, right  Decreased range of motion of right shoulder  Weakness  generalized      Subjective Assessment - 05/24/14 1031    Symptoms S: I did my exercises saturday and I worked real good. I was sore sunday.    Currently in Pain? No/denies          [facility] OT Assessment - 05/24/14 1032    Precautions   Precautions Shoulder   Type of Shoulder Precautions PROM, progress to AAROM then AROM.                OT Treatments/Exercises (OP) - 05/24/14 1032    Shoulder Exercises: Supine   Protraction PROM;5 reps;Strengthening;15 reps   Protraction Weight (lbs) 1   Horizontal ABduction PROM;5 reps;Strengthening;15 reps   Horizontal ABduction Weight (lbs) 1   External Rotation PROM;5 reps;Strengthening;15 reps   External Rotation Weight (lbs) 1   Internal Rotation PROM;5 reps;Strengthening;15 reps   Internal Rotation Weight (lbs) 1   Flexion PROM;5 reps;Strengthening;15 reps   Shoulder Flexion Weight (lbs) 1   ABduction PROM;5 reps;Strengthening;15 reps   Shoulder ABduction Weight (lbs) 1   Shoulder Exercises: Standing   Protraction AROM;12 reps   Horizontal ABduction AROM;12 reps   External Rotation AROM;12 reps   Internal Rotation AROM;12 reps   Flexion AROM;12 reps   ABduction AROM;12 reps   Extension Theraband;12 reps  Theraband Level (Shoulder Extension) Level 2 (Red)   Row Theraband;12 reps   Theraband Level (Shoulder Row) Level 2 (Red)   Other Standing Exercises Finger ladder to #13. 5 reps.   Shoulder Exercises: Therapy Ball   Right/Left 5 reps   Shoulder Exercises: ROM/Strengthening   UBE (Upper Arm Bike) Level 2 3' forward 3' reverse   Proximal Shoulder Strengthening, Supine 15X with 1#   Manual Therapy   Manual Therapy Myofascial release   Myofascial Release muscle energy technique to right anterior deltoid to relax tone and muscle spasm and improve range of motion.                  OT Short Term Goals - 04/28/14 1511    OT SHORT TERM GOAL #1   Title Patient will be educated on HEP.   Status Achieved   OT  SHORT TERM GOAL #2   Title Patient will increase PROM of right shoulder to [facility] to increase ability to perform dressing tasks with less difficulty.   Status Achieved   OT SHORT TERM GOAL #3   Title Patient will report a pain level of 1/10 or less with daily tasks.   Status On-going   OT SHORT TERM GOAL #4   Title Patient will increase right shoulder strength to 3/5 to increase ability to wash hair.    Status On-going   OT SHORT TERM GOAL #5   Title Patient will decrease fascial restrictions from max to mod amount.   Status Achieved           OT Long Term Goals - 04/28/14 1514    OT LONG TERM GOAL #1   Title Patient will return to highest level of independence with BADL and leisure tasks.    Status On-going   OT LONG TERM GOAL #2   Title Patient will increase AROM of right shoulder to [facility] to increase ability reach into overhead cabinets with less difficulty.   Status On-going   OT LONG TERM GOAL #3   Title Patient will increase right shoulder strength 4/5 to increase ability to complete daily tasks with less difficulty.    Status On-going   OT LONG TERM GOAL #4   Title Patient will decrease fascial restrictions from mod to min amount.   Status On-going               Plan - 05/24/14 1101    Clinical Impression Statement A: Increased supine strengthening reps and added UBE bike. patient tolerated well.    Plan P: Reasess for MD visit. Add overhead lacing.         Problem List Patient Active Problem List   Diagnosis Date Noted  . Rotator cuff tear arthropathy of right shoulder 03/12/2014  . S/P shoulder replacement 03/11/2014  . Chest discomfort 04/20/2013  . Esophageal reflux 09/07/2012  . Essential hypertension, benign 03/06/2011  . Mixed hyperlipidemia 03/06/2011  . Impaired glucose tolerance 03/06/2011  . Asthma 03/06/2011    Ailene Ravel, OTR/L,CBIS  [phone]  05/24/2014, 11:03 AM  [place] [address] [place], [place], [address] Phone: [phone]   Fax:  [phone]

## 2014-05-26 ENCOUNTER — Encounter (HOSPITAL_COMMUNITY): Payer: Self-pay

## 2014-05-26 ENCOUNTER — Ambulatory Visit (HOSPITAL_COMMUNITY)
Admission: RE | Admit: 2014-05-26 | Discharge: 2014-05-26 | Disposition: A | Discharge status: Home / Self Care | Payer: Medicare Other | Source: Ambulatory Visit | Attending: Orthopedic Surgery | Admitting: Orthopedic Surgery

## 2014-05-26 DIAGNOSIS — M25611 Stiffness of right shoulder, not elsewhere classified: Secondary | ICD-10-CM

## 2014-05-26 DIAGNOSIS — R531 Weakness: Secondary | ICD-10-CM | POA: Diagnosis not present

## 2014-05-26 DIAGNOSIS — Z471 Aftercare following joint replacement surgery: Secondary | ICD-10-CM | POA: Diagnosis not present

## 2014-05-26 DIAGNOSIS — Z96611 Presence of right artificial shoulder joint: Secondary | ICD-10-CM

## 2014-05-26 NOTE — Therapy (Signed)
[place], [place], [address] Phone: [phone]   Fax:  [phone]  Occupational Therapy Reassessment and Treatment  Patient Details  Name: Breanna Mason MRN: [id number] Date of Birth: 1946-01-10 Referring Provider:  Marin Shutter, MD  Encounter Date: 05/26/2014      OT End of Session - 05/26/14 1057    Visit Number 16   Number of Visits 24   Date for OT Re-Evaluation 07/14/14  Mini reassessment: 06/23/14   Authorization Type Medicare Part A   Authorization Time Period Before 26th visit   Authorization - Visit Number 11   Authorization - Number of Visits 26   OT Start Time 1020   OT Stop Time 1100   OT Time Calculation (min) 40 min   Activity Tolerance Patient tolerated treatment well   Behavior During Therapy [facility] for tasks assessed/performed      Past Medical History  Diagnosis Date  . Essential hypertension, benign   . GERD (gastroesophageal reflux disease)   . Asthma   . Mixed hyperlipidemia   . Impaired glucose tolerance   . Osteoporosis   . Anxiety   . Osteopenia   . Reflux   . IFG (impaired fasting glucose)   . Allergy   . PONV (postoperative nausea and vomiting)   . Pneumonia     hx  . Arthritis     Past Surgical History  Procedure Laterality Date  . Cesarean section    . Bilateral tubal ligation    . Shoulder surgery Right     x4  . Colonoscopy N/A 09/17/2013    Procedure: COLONOSCOPY;  Surgeon: Rogene Houston, MD;  [facility];  Service: Endoscopy;  Laterality: N/A;  1200  . Tubal ligation    . Carpal tunnel release Right     30 yrs  . Reverse shoulder arthroplasty Right 03/11/2014    Procedure: RIGHT REVERSE SHOULDER ARTHROPLASTY;  Surgeon: Marin Shutter, MD;  Location: [place];  Service: Orthopedics;  Laterality: Right;    There were no vitals taken for this visit.  Visit Diagnosis:  S/p reverse total shoulder arthroplasty, right  Decreased range of  motion of right shoulder  Weakness generalized        [facility] OT Assessment - 05/26/14 1031    Assessment   Diagnosis s/p right shoulder joint reverse joint replacement   Precautions   Precautions Shoulder   Type of Shoulder Precautions PROM, progress to AAROM then AROM.    AROM   Right Shoulder Flexion 126 Degrees  last progress note: 94   Right Shoulder ABduction 94 Degrees  same at last progress note   Right Shoulder Internal Rotation 90 Degrees  last progress note: 90   Right Shoulder External Rotation 0 Degrees  last progress note: -15   PROM   Overall PROM Comments Assessed supine. IR/ER adducted   Right Shoulder Flexion 166 Degrees  last progress note: 147   Right Shoulder ABduction 180 Degrees  last progress note: 157   Right Shoulder Internal Rotation 90 Degrees  last progress note: 90   Right Shoulder External Rotation 50 Degrees  on eval: 10   Strength   Overall Strength Comments Assessed standing. IR/ER adducted    Right Shoulder Flexion 3/5   Right Shoulder ABduction 3-/5   Right Shoulder Internal Rotation 4-/5   Right Shoulder External Rotation 3-/5  OT Treatments/Exercises (OP) - Jun 16, 2014 1037    Shoulder Exercises: Supine   Protraction PROM;5 reps;Strengthening;15 reps   Protraction Weight (lbs) 1   Horizontal ABduction PROM;5 reps;Strengthening;15 reps   Horizontal ABduction Weight (lbs) 1   External Rotation PROM;5 reps;Strengthening;15 reps   External Rotation Weight (lbs) 1   Internal Rotation PROM;5 reps;Strengthening;15 reps   Internal Rotation Weight (lbs) 1   Flexion PROM;5 reps;Strengthening;15 reps   Shoulder Flexion Weight (lbs) 1   ABduction PROM;5 reps;Strengthening;15 reps   Shoulder ABduction Weight (lbs) 1   Shoulder Exercises: Standing   Protraction AROM;12 reps   Horizontal ABduction AROM;12 reps   External Rotation AAROM;12 reps   Internal Rotation AROM;12 reps   Flexion AROM;12 reps   ABduction  AROM;12 reps   Shoulder Exercises: ROM/Strengthening   UBE (Upper Arm Bike) Level 2 3' forward 3' reverse   Over Head Lace 1'   Proximal Shoulder Strengthening, Supine 15X with 1#   Manual Therapy   Manual Therapy Myofascial release   Myofascial Release muscle energy technique to right anterior deltoid to relax tone and muscle spasm and improve range of motion.                  OT Short Term Goals - Jun 16, 2014 1056    OT SHORT TERM GOAL #1   Title Patient will be educated on HEP.   Status Achieved   OT SHORT TERM GOAL #2   Title Patient will increase PROM of right shoulder to [facility] to increase ability to perform dressing tasks with less difficulty.   Status Achieved   OT SHORT TERM GOAL #3   Title Patient will report a pain level of 1/10 or less with daily tasks.   Status Achieved   OT SHORT TERM GOAL #4   Title Patient will increase right shoulder strength to 3/5 to increase ability to wash hair.    Status On-going   OT SHORT TERM GOAL #5   Title Patient will decrease fascial restrictions from max to mod amount.   Status Achieved           OT Long Term Goals - 2014/06/16 1057    OT LONG TERM GOAL #1   Title Patient will return to highest level of independence with BADL and leisure tasks.    Status On-going   OT LONG TERM GOAL #2   Title Patient will increase AROM of right shoulder to [facility] to increase ability reach into overhead cabinets with less difficulty.   Status On-going   OT LONG TERM GOAL #3   Title Patient will increase right shoulder strength 4/5 to increase ability to complete daily tasks with less difficulty.    Status On-going   OT LONG TERM GOAL #4   Title Patient will decrease fascial restrictions from mod to min amount.   Status On-going               Plan - 2014/06/16 1058    Clinical Impression Statement A: Reassessment completed this date. Patient met 4/5 STGs and no LTGs. Patient is making slow progress although she is progressing towards  therapy goals. Added overhead lacing. patient completed and had difficulty sittng upright without leaning backwards.   Plan P: Increase time with overhead lacing and working on keeping feet on the floor without leaning back to complete.           G-Codes - 06-16-14 1102    Functional Assessment Tool Used FOTO score: 43/100 (57% impaired)  Functional Limitation Carrying, moving and handling objects   Carrying, Moving and Handling Objects Current Status [phone]) At least 40 percent but less than 60 percent impaired, limited or restricted   Carrying, Moving and Handling Objects Goal Status ([id number]) At least 20 percent but less than 40 percent impaired, limited or restricted      Problem List Patient Active Problem List   Diagnosis Date Noted  . Rotator cuff tear arthropathy of right shoulder 03/12/2014  . S/P shoulder replacement 03/11/2014  . Chest discomfort 04/20/2013  . Esophageal reflux 09/07/2012  . Essential hypertension, benign 03/06/2011  . Mixed hyperlipidemia 03/06/2011  . Impaired glucose tolerance 03/06/2011  . Asthma 03/06/2011    Ailene Ravel, OTR/L,CBIS  [phone]  05/26/2014, 11:03 AM  [place] [address] [place], [place], [address] Phone: [phone]   Fax:  [phone]

## 2014-05-31 ENCOUNTER — Encounter (HOSPITAL_COMMUNITY): Payer: Self-pay

## 2014-05-31 ENCOUNTER — Ambulatory Visit (HOSPITAL_COMMUNITY)
Admission: RE | Admit: 2014-05-31 | Discharge: 2014-05-31 | Disposition: A | Discharge status: Home / Self Care | Payer: Medicare Other | Source: Ambulatory Visit | Attending: Orthopedic Surgery | Admitting: Orthopedic Surgery

## 2014-05-31 DIAGNOSIS — M25611 Stiffness of right shoulder, not elsewhere classified: Secondary | ICD-10-CM | POA: Diagnosis not present

## 2014-05-31 DIAGNOSIS — Z96611 Presence of right artificial shoulder joint: Secondary | ICD-10-CM | POA: Diagnosis not present

## 2014-05-31 DIAGNOSIS — R531 Weakness: Secondary | ICD-10-CM

## 2014-05-31 DIAGNOSIS — Z471 Aftercare following joint replacement surgery: Secondary | ICD-10-CM | POA: Diagnosis not present

## 2014-05-31 NOTE — Patient Instructions (Addendum)
ROM: Abduction (Standing)   Bring arms straight out from sides and raise as high as possible without pain. Repeat _12___ times per set. Do __1__ sets per session. Do __2__ sessions per day.     Extension (Active) ROM: Extension (Standing)   Bring arms straight back as far as possible without pain. Repeat _12___ times per set. Do _1___ sets per session. Do _2___ sessions per day.    ROM: External / Internal Rotation - in Abduction (Standing)   With upper arms parallel to floor and elbows bent at right angles, gently rotate arms up then down as far as possible without pain. Repeat _12__ times per set. Do __1__ sets per session. Do __2__ sessions per day.     Flexors Stretch (Active)   Stand, arms straight at sides. Bring arms straight forward and upward as high as possible without pain. Hold __5_ seconds. Repeat _12__ times per session. Do _2__ sessions per day.    Scapular Retraction (Standing)   With arms at sides, pinch shoulder blades together. Repeat __12__ times per set. Do __1__ sets per session. Do _2___ sessions per day.  1) Punch out 12 times 1 set   Flexibility: Corner Stretch   Standing in corner with hands just above shoulder level and feet 2 feet from corner, lean forward until a comfortable stretch is felt across chest. Hold _5___ seconds. Repeat _10___ times per set. Do __1__ sets per session. Do __2__ sessions per day.   Flexors Stretch, Standing   Stand near wall and slide arm up, with palm facing away from wall, by leaning toward wall. Hold __5_ seconds.  Repeat _10__ times per session. Do _2__ sessions per day.

## 2014-05-31 NOTE — Therapy (Signed)
[place], [place], [address] Phone: [phone]   Fax:  [phone]  Occupational Therapy Treatment  Patient Details  Name: Breanna Mason MRN: [id number] Date of Birth: 1945-06-21 Referring Provider:  Marin Shutter, MD  Encounter Date: 05/31/2014      OT End of Session - 05/31/14 1146    Visit Number 17   Number of Visits 24   Date for OT Re-Evaluation 07/14/14  Mini reassessment: 06/23/14   Authorization Type Medicare Part A   Authorization Time Period Before 26th visit   Authorization - Visit Number 72   Authorization - Number of Visits 26   OT Start Time 1024   OT Stop Time 1100   OT Time Calculation (min) 36 min   Activity Tolerance Patient tolerated treatment well   Behavior During Therapy [facility] for tasks assessed/performed      Past Medical History  Diagnosis Date  . Essential hypertension, benign   . GERD (gastroesophageal reflux disease)   . Asthma   . Mixed hyperlipidemia   . Impaired glucose tolerance   . Osteoporosis   . Anxiety   . Osteopenia   . Reflux   . IFG (impaired fasting glucose)   . Allergy   . PONV (postoperative nausea and vomiting)   . Pneumonia     hx  . Arthritis     Past Surgical History  Procedure Laterality Date  . Cesarean section    . Bilateral tubal ligation    . Shoulder surgery Right     x4  . Colonoscopy N/A 09/17/2013    Procedure: COLONOSCOPY;  Surgeon: Rogene Houston, MD;  [facility];  Service: Endoscopy;  Laterality: N/A;  1200  . Tubal ligation    . Carpal tunnel release Right     30 yrs  . Reverse shoulder arthroplasty Right 03/11/2014    Procedure: RIGHT REVERSE SHOULDER ARTHROPLASTY;  Surgeon: Marin Shutter, MD;  Location: [place];  Service: Orthopedics;  Laterality: Right;    There were no vitals taken for this visit.  Visit Diagnosis:  S/p reverse total shoulder arthroplasty, right  Decreased range of motion of right  shoulder  Weakness generalized      Subjective Assessment - 05/31/14 1024    Symptoms S: MY arm's moving pretty good.    Currently in Pain? No/denies          [facility] OT Assessment - 05/31/14 1039    Assessment   Diagnosis s/p right shoulder joint reverse joint replacement   Precautions   Precautions Shoulder   Type of Shoulder Precautions PROM, progress to AAROM then AROM.                OT Treatments/Exercises (OP) - 05/31/14 1034    Shoulder Exercises: Supine   Protraction PROM;5 reps;Strengthening;12 reps   Protraction Weight (lbs) 2   Horizontal ABduction PROM;5 reps;Strengthening;12 reps   Horizontal ABduction Weight (lbs) 2   External Rotation PROM;5 reps;Strengthening;12 reps   External Rotation Weight (lbs) 2   Internal Rotation PROM;5 reps;Strengthening;12 reps   Internal Rotation Weight (lbs) 2   Flexion PROM;5 reps;Strengthening;12 reps   Shoulder Flexion Weight (lbs) 2   ABduction PROM;5 reps;Strengthening;12 reps   Shoulder ABduction Weight (lbs) 2   Shoulder Exercises: Standing   Protraction AROM;12 reps   Horizontal ABduction AROM;12 reps   External Rotation AAROM;12 reps   Internal Rotation AROM;12 reps   Flexion AROM;12 reps   ABduction  AROM;12 reps   Shoulder Exercises: ROM/Strengthening   Proximal Shoulder Strengthening, Supine 12X with 2#   Shoulder Exercises: Stretch   Corner Stretch 1 rep;10 seconds   Other Shoulder Stretches Wall flexion 2 reps 10" hold   Other Shoulder Stretches Wall abduction stretch 2 reps 30" hold   Manual Therapy   Manual Therapy Myofascial release   Myofascial Release muscle energy technique to right anterior deltoid to relax tone and muscle spasm and improve range of motion.                 OT Education - 05/31/14 1146    Education provided Yes   Education Details AROM exercises and wall stretches   Person(s) Educated Patient   Methods Explanation;Demonstration   Comprehension Verbalized  understanding;Returned demonstration          OT Short Term Goals - 05/31/14 1149    OT SHORT TERM GOAL #1   Title Patient will be educated on HEP.   OT SHORT TERM GOAL #2   Title Patient will increase PROM of right shoulder to [facility] to increase ability to perform dressing tasks with less difficulty.   OT SHORT TERM GOAL #3   Title Patient will report a pain level of 1/10 or less with daily tasks.   OT SHORT TERM GOAL #4   Title Patient will increase right shoulder strength to 3/5 to increase ability to wash hair.    Status On-going   OT SHORT TERM GOAL #5   Title Patient will decrease fascial restrictions from max to mod amount.           OT Long Term Goals - 05/26/14 1057    OT LONG TERM GOAL #1   Title Patient will return to highest level of independence with BADL and leisure tasks.    Status On-going   OT LONG TERM GOAL #2   Title Patient will increase AROM of right shoulder to [facility] to increase ability reach into overhead cabinets with less difficulty.   Status On-going   OT LONG TERM GOAL #3   Title Patient will increase right shoulder strength 4/5 to increase ability to complete daily tasks with less difficulty.    Status On-going   OT LONG TERM GOAL #4   Title Patient will decrease fascial restrictions from mod to min amount.   Status On-going               Plan - 05/31/14 1146    Clinical Impression Statement A: Updated HEP to increase AROM exercises and additional shoulder stretches. Overhead lacing not completed due to time constraint.   Plan P: Increase time with overhead lacing and working on keeping feet on the floor without leaning back to complete.         Problem List Patient Active Problem List   Diagnosis Date Noted  . Rotator cuff tear arthropathy of right shoulder 03/12/2014  . S/P shoulder replacement 03/11/2014  . Chest discomfort 04/20/2013  . Esophageal reflux 09/07/2012  . Essential hypertension, benign 03/06/2011  . Mixed  hyperlipidemia 03/06/2011  . Impaired glucose tolerance 03/06/2011  . Asthma 03/06/2011    Ailene Ravel, OTR/L,CBIS  [phone]  05/31/2014, 11:49 AM  [place] [address] [place], [place], [address] Phone: [phone]   Fax:  [phone]

## 2014-06-02 ENCOUNTER — Encounter (HOSPITAL_COMMUNITY): Payer: Self-pay

## 2014-06-02 ENCOUNTER — Ambulatory Visit (HOSPITAL_COMMUNITY)
Admission: RE | Admit: 2014-06-02 | Discharge: 2014-06-02 | Disposition: A | Discharge status: Home / Self Care | Payer: Medicare Other | Source: Ambulatory Visit | Attending: Orthopedic Surgery | Admitting: Orthopedic Surgery

## 2014-06-02 DIAGNOSIS — R531 Weakness: Secondary | ICD-10-CM

## 2014-06-02 DIAGNOSIS — M25611 Stiffness of right shoulder, not elsewhere classified: Secondary | ICD-10-CM

## 2014-06-02 DIAGNOSIS — Z96611 Presence of right artificial shoulder joint: Secondary | ICD-10-CM

## 2014-06-02 DIAGNOSIS — Z471 Aftercare following joint replacement surgery: Secondary | ICD-10-CM | POA: Diagnosis not present

## 2014-06-02 NOTE — Therapy (Signed)
[place], [place], [address] Phone: [phone]   Fax:  [phone]  Occupational Therapy Treatment  Patient Details  Name: Breanna Mason MRN: [id number] Date of Birth: Jul 13, 1945 Referring Provider:  Marin Shutter, MD  Encounter Date: 06/02/2014      OT End of Session - 06/02/14 1056    Visit Number 18   Number of Visits 24   Date for OT Re-Evaluation 07/14/14  Mini reassessment: 06/23/14   Authorization Type Medicare Part A   Authorization Time Period Before 26th visit   Authorization - Visit Number 18   Authorization - Number of Visits 26   OT Start Time 1015   OT Stop Time 1102   OT Time Calculation (min) 47 min   Activity Tolerance Patient tolerated treatment well   Behavior During Therapy [facility] for tasks assessed/performed      Past Medical History  Diagnosis Date  . Essential hypertension, benign   . GERD (gastroesophageal reflux disease)   . Asthma   . Mixed hyperlipidemia   . Impaired glucose tolerance   . Osteoporosis   . Anxiety   . Osteopenia   . Reflux   . IFG (impaired fasting glucose)   . Allergy   . PONV (postoperative nausea and vomiting)   . Pneumonia     hx  . Arthritis     Past Surgical History  Procedure Laterality Date  . Cesarean section    . Bilateral tubal ligation    . Shoulder surgery Right     x4  . Colonoscopy N/A 09/17/2013    Procedure: COLONOSCOPY;  Surgeon: Rogene Houston, MD;  [facility];  Service: Endoscopy;  Laterality: N/A;  1200  . Tubal ligation    . Carpal tunnel release Right     30 yrs  . Reverse shoulder arthroplasty Right 03/11/2014    Procedure: RIGHT REVERSE SHOULDER ARTHROPLASTY;  Surgeon: Marin Shutter, MD;  Location: [place];  Service: Orthopedics;  Laterality: Right;    There were no vitals taken for this visit.  Visit Diagnosis:  S/p reverse total shoulder arthroplasty, right  Decreased range of motion of right  shoulder  Weakness generalized      Subjective Assessment - 06/02/14 1040    Symptoms S: I'm able to get my hand behind my back.    Limitations Supple Protocol: weeks 4-8 (12/3 - 12/31): AAROM and AROM in all planes - do not aggressively push ROM!  Weeks 8-12 (12/31- 1/28) PROM and AROM to tolerance, sidelying, gradual resistance   Currently in Pain? No/denies          [facility] OT Assessment - 06/02/14 1039    Assessment   Diagnosis s/p right shoulder joint reverse joint replacement   Precautions   Precautions Shoulder   Type of Shoulder Precautions PROM, progress to AAROM then AROM.                OT Treatments/Exercises (OP) - 06/02/14 1034    Shoulder Exercises: Supine   Protraction PROM;5 reps;Strengthening;15 reps   Protraction Weight (lbs) 2   Horizontal ABduction PROM;5 reps;Strengthening;15 reps   Horizontal ABduction Weight (lbs) 2   External Rotation PROM;5 reps;Strengthening;15 reps   External Rotation Weight (lbs) 2   Internal Rotation PROM;5 reps;Strengthening;15 reps   Internal Rotation Weight (lbs) 2   Flexion PROM;5 reps;Strengthening;15 reps   Shoulder Flexion Weight (lbs) 2   ABduction PROM;5 reps;Strengthening;15 reps   Shoulder ABduction  Weight (lbs) 2   Shoulder Exercises: Standing   Protraction AROM;12 reps   Horizontal ABduction AROM;12 reps   External Rotation AROM;12 reps   Internal Rotation AROM;12 reps   Flexion AROM;12 reps   ABduction AROM;12 reps   Shoulder Exercises: ROM/Strengthening   UBE (Upper Arm Bike) Level 2 3' forward 3' reverse   Over Head Lace 3'   Proximal Shoulder Strengthening, Supine 15X with 23   Proximal Shoulder Strengthening, Seated 12X no weight   Manual Therapy   Manual Therapy Myofascial release   Myofascial Release muscle energy technique to right anterior deltoid to relax tone and muscle spasm and improve range of motion.                    OT Short Term Goals - 05/31/14 1149    OT SHORT TERM  GOAL #1   Title Patient will be educated on HEP.   OT SHORT TERM GOAL #2   Title Patient will increase PROM of right shoulder to [facility] to increase ability to perform dressing tasks with less difficulty.   OT SHORT TERM GOAL #3   Title Patient will report a pain level of 1/10 or less with daily tasks.   OT SHORT TERM GOAL #4   Title Patient will increase right shoulder strength to 3/5 to increase ability to wash hair.    Status On-going   OT SHORT TERM GOAL #5   Title Patient will decrease fascial restrictions from max to mod amount.           OT Long Term Goals - 05/26/14 1057    OT LONG TERM GOAL #1   Title Patient will return to highest level of independence with BADL and leisure tasks.    Status On-going   OT LONG TERM GOAL #2   Title Patient will increase AROM of right shoulder to [facility] to increase ability reach into overhead cabinets with less difficulty.   Status On-going   OT LONG TERM GOAL #3   Title Patient will increase right shoulder strength 4/5 to increase ability to complete daily tasks with less difficulty.    Status On-going   OT LONG TERM GOAL #4   Title Patient will decrease fascial restrictions from mod to min amount.   Status On-going               Plan - 06/02/14 1056    Clinical Impression Statement A: Increased time with overhead lacing and increased reps with supine strengthening exercises. Patient tolerated well.    Plan P: Cont to work on increase shoulder flexion and ER during functional tasks.         Problem List Patient Active Problem List   Diagnosis Date Noted  . Rotator cuff tear arthropathy of right shoulder 03/12/2014  . S/P shoulder replacement 03/11/2014  . Chest discomfort 04/20/2013  . Esophageal reflux 09/07/2012  . Essential hypertension, benign 03/06/2011  . Mixed hyperlipidemia 03/06/2011  . Impaired glucose tolerance 03/06/2011  . Asthma 03/06/2011    Ailene Ravel, OTR/L,CBIS  [phone]  06/02/2014, 10:58  AM  [facility] [address] [place], [place], [address] Phone: [phone]   Fax:  [phone]

## 2014-06-09 ENCOUNTER — Encounter (HOSPITAL_COMMUNITY): Payer: Self-pay

## 2014-06-09 ENCOUNTER — Ambulatory Visit (HOSPITAL_COMMUNITY)
Admission: RE | Admit: 2014-06-09 | Discharge: 2014-06-09 | Disposition: A | Discharge status: Home / Self Care | Payer: Medicare Other | Source: Ambulatory Visit | Attending: Orthopedic Surgery | Admitting: Orthopedic Surgery

## 2014-06-09 DIAGNOSIS — R531 Weakness: Secondary | ICD-10-CM | POA: Diagnosis not present

## 2014-06-09 DIAGNOSIS — Z471 Aftercare following joint replacement surgery: Secondary | ICD-10-CM | POA: Insufficient documentation

## 2014-06-09 DIAGNOSIS — Z96611 Presence of right artificial shoulder joint: Secondary | ICD-10-CM | POA: Insufficient documentation

## 2014-06-09 DIAGNOSIS — M25611 Stiffness of right shoulder, not elsewhere classified: Secondary | ICD-10-CM | POA: Diagnosis not present

## 2014-06-09 NOTE — Therapy (Signed)
[place], [place], [address] Phone: [phone]   Fax:  [phone]  Occupational Therapy Treatment  Patient Details  Name: Breanna Mason MRN: [id number] Date of Birth: July 25, 1945 Referring Provider:  Marin Shutter, MD  Encounter Date: 06/09/2014      OT End of Session - 06/09/14 1123    Visit Number 19   Number of Visits 24   Date for OT Re-Evaluation 07/14/14  Mini reassessment: 06/23/14   Authorization Type Medicare Part A   Authorization Time Period Before 26th visit   Authorization - Visit Number 18   Authorization - Number of Visits 26   OT Start Time 1020   OT Stop Time 1100   OT Time Calculation (min) 40 min   Activity Tolerance Patient tolerated treatment well   Behavior During Therapy [facility] for tasks assessed/performed      Past Medical History  Diagnosis Date  . Essential hypertension, benign   . GERD (gastroesophageal reflux disease)   . Asthma   . Mixed hyperlipidemia   . Impaired glucose tolerance   . Osteoporosis   . Anxiety   . Osteopenia   . Reflux   . IFG (impaired fasting glucose)   . Allergy   . PONV (postoperative nausea and vomiting)   . Pneumonia     hx  . Arthritis     Past Surgical History  Procedure Laterality Date  . Cesarean section    . Bilateral tubal ligation    . Shoulder surgery Right     x4  . Colonoscopy N/A 09/17/2013    Procedure: COLONOSCOPY;  Surgeon: Rogene Houston, MD;  [facility];  Service: Endoscopy;  Laterality: N/A;  1200  . Tubal ligation    . Carpal tunnel release Right     30 yrs  . Reverse shoulder arthroplasty Right 03/11/2014    Procedure: RIGHT REVERSE SHOULDER ARTHROPLASTY;  Surgeon: Marin Shutter, MD;  Location: [place];  Service: Orthopedics;  Laterality: Right;    There were no vitals taken for this visit.  Visit Diagnosis:  S/p reverse total shoulder arthroplasty, right  Decreased range of motion of right  shoulder  Weakness generalized      Subjective Assessment - 06/09/14 1035    Symptoms S: I'm still having trouble getting it to go out (doing ER).   Currently in Pain? No/denies          [facility] OT Assessment - 06/09/14 1035    Assessment   Diagnosis s/p right shoulder joint reverse joint replacement   Precautions   Precautions Shoulder   Type of Shoulder Precautions PROM, progress to AAROM then AROM.                OT Treatments/Exercises (OP) - 06/09/14 1034    Shoulder Exercises: Supine   Protraction PROM;5 reps;Strengthening;15 reps   Protraction Weight (lbs) 2   Horizontal ABduction PROM;5 reps;Strengthening;15 reps   Horizontal ABduction Weight (lbs) 2   External Rotation PROM;5 reps;Strengthening;15 reps   External Rotation Weight (lbs) 2   Internal Rotation PROM;5 reps;Strengthening;15 reps   Internal Rotation Weight (lbs) 2   Flexion PROM;5 reps;Strengthening;15 reps   Shoulder Flexion Weight (lbs) 2   ABduction PROM;5 reps;Strengthening;15 reps   Shoulder ABduction Weight (lbs) 2   Shoulder Exercises: Standing   Protraction AROM;12 reps   Horizontal ABduction AROM;12 reps   External Rotation AROM;12 reps   Internal Rotation AROM;12 reps   Flexion  AROM;12 reps   ABduction AROM;12 reps   Other Standing Exercises Finger ladder to #14. 5 reps.   Shoulder Exercises: ROM/Strengthening   UBE (Upper Arm Bike) Level 2 3' forward 3' reverse   Over Head Lace 3'   Proximal Shoulder Strengthening, Supine 15X with 2#   Proximal Shoulder Strengthening, Seated 12X no weight   Manual Therapy   Manual Therapy Myofascial release   Myofascial Release muscle energy technique to right anterior deltoid to relax tone and muscle spasm and improve range of motion.                  OT Short Term Goals - 05/31/14 1149    OT SHORT TERM GOAL #1   Title Patient will be educated on HEP.   OT SHORT TERM GOAL #2   Title Patient will increase PROM of right shoulder to  [facility] to increase ability to perform dressing tasks with less difficulty.   OT SHORT TERM GOAL #3   Title Patient will report a pain level of 1/10 or less with daily tasks.   OT SHORT TERM GOAL #4   Title Patient will increase right shoulder strength to 3/5 to increase ability to wash hair.    Status On-going   OT SHORT TERM GOAL #5   Title Patient will decrease fascial restrictions from max to mod amount.           OT Long Term Goals - 05/26/14 1057    OT LONG TERM GOAL #1   Title Patient will return to highest level of independence with BADL and leisure tasks.    Status On-going   OT LONG TERM GOAL #2   Title Patient will increase AROM of right shoulder to [facility] to increase ability reach into overhead cabinets with less difficulty.   Status On-going   OT LONG TERM GOAL #3   Title Patient will increase right shoulder strength 4/5 to increase ability to complete daily tasks with less difficulty.    Status On-going   OT LONG TERM GOAL #4   Title Patient will decrease fascial restrictions from mod to min amount.   Status On-going               Plan - 06/09/14 1124    Clinical Impression Statement A: Pt able to achieve more ROM during ER with passive stretching although when she attempts to perform she is unable to achieve the same amount of ROM.    Plan P: Complete ER wall stretch.         Problem List Patient Active Problem List   Diagnosis Date Noted  . Rotator cuff tear arthropathy of right shoulder 03/12/2014  . S/P shoulder replacement 03/11/2014  . Chest discomfort 04/20/2013  . Esophageal reflux 09/07/2012  . Essential hypertension, benign 03/06/2011  . Mixed hyperlipidemia 03/06/2011  . Impaired glucose tolerance 03/06/2011  . Asthma 03/06/2011    Ailene Ravel, OTR/L,CBIS  [phone]  06/09/2014, 11:27 AM  [facility] [address] [place], [place], [address] Phone: [phone]   Fax:   [phone]

## 2014-06-11 ENCOUNTER — Encounter (HOSPITAL_COMMUNITY): Payer: Self-pay

## 2014-06-11 ENCOUNTER — Ambulatory Visit (HOSPITAL_COMMUNITY)
Admission: RE | Admit: 2014-06-11 | Discharge: 2014-06-11 | Disposition: A | Discharge status: Home / Self Care | Payer: Medicare Other | Source: Ambulatory Visit | Attending: Orthopedic Surgery | Admitting: Orthopedic Surgery

## 2014-06-11 DIAGNOSIS — M25611 Stiffness of right shoulder, not elsewhere classified: Secondary | ICD-10-CM | POA: Diagnosis not present

## 2014-06-11 DIAGNOSIS — Z96611 Presence of right artificial shoulder joint: Secondary | ICD-10-CM | POA: Diagnosis not present

## 2014-06-11 DIAGNOSIS — R531 Weakness: Secondary | ICD-10-CM | POA: Diagnosis not present

## 2014-06-11 DIAGNOSIS — Z471 Aftercare following joint replacement surgery: Secondary | ICD-10-CM | POA: Diagnosis not present

## 2014-06-11 NOTE — Therapy (Signed)
[place], [place], [address] Phone: [phone]   Fax:  [phone]  Occupational Therapy Treatment  Patient Details  Name: Breanna Mason MRN: [id number] Date of Birth: 15-Apr-1946 Referring Provider:  Marin Shutter, MD  Encounter Date: 06/11/2014      OT End of Session - 06/11/14 1057    Visit Number 20   Number of Visits 24   Date for OT Re-Evaluation 07/14/14  Mini reassessment: 06/23/14   Authorization Type Medicare Part A   Authorization Time Period Before 26th visit   Authorization - Visit Number 30   Authorization - Number of Visits 26   OT Start Time 1015   OT Stop Time 1100   OT Time Calculation (min) 45 min   Activity Tolerance Patient tolerated treatment well   Behavior During Therapy [facility] for tasks assessed/performed      Past Medical History  Diagnosis Date  . Essential hypertension, benign   . GERD (gastroesophageal reflux disease)   . Asthma   . Mixed hyperlipidemia   . Impaired glucose tolerance   . Osteoporosis   . Anxiety   . Osteopenia   . Reflux   . IFG (impaired fasting glucose)   . Allergy   . PONV (postoperative nausea and vomiting)   . Pneumonia     hx  . Arthritis     Past Surgical History  Procedure Laterality Date  . Cesarean section    . Bilateral tubal ligation    . Shoulder surgery Right     x4  . Colonoscopy N/A 09/17/2013    Procedure: COLONOSCOPY;  Surgeon: Rogene Houston, MD;  [facility];  Service: Endoscopy;  Laterality: N/A;  1200  . Tubal ligation    . Carpal tunnel release Right     30 yrs  . Reverse shoulder arthroplasty Right 03/11/2014    Procedure: RIGHT REVERSE SHOULDER ARTHROPLASTY;  Surgeon: Marin Shutter, MD;  Location: [place];  Service: Orthopedics;  Laterality: Right;    There were no vitals taken for this visit.  Visit Diagnosis:  S/p reverse total shoulder arthroplasty, right  Decreased range of motion of right  shoulder  Weakness generalized      Subjective Assessment - 06/11/14 1034    Symptoms S: I did my stretches this morning.    Currently in Pain? No/denies          [facility] OT Assessment - 06/11/14 1035    Assessment   Diagnosis s/p right shoulder joint reverse joint replacement   Precautions   Precautions Shoulder   Type of Shoulder Precautions PROM, progress to AAROM then AROM.                OT Treatments/Exercises (OP) - 06/11/14 1035    Shoulder Exercises: Supine   Protraction PROM;5 reps;Strengthening;15 reps   Protraction Weight (lbs) 2   Horizontal ABduction PROM;5 reps;Strengthening;15 reps   Horizontal ABduction Weight (lbs) 2   External Rotation PROM;5 reps;Strengthening;15 reps   External Rotation Weight (lbs) 2   Internal Rotation PROM;5 reps;Strengthening;15 reps   Internal Rotation Weight (lbs) 2   Flexion PROM;5 reps;Strengthening;15 reps   Shoulder Flexion Weight (lbs) 2   ABduction PROM;5 reps;Strengthening;15 reps   Shoulder ABduction Weight (lbs) 2   Shoulder Exercises: Standing   Protraction AROM;15 reps   Horizontal ABduction AROM;15 reps   External Rotation AROM;15 reps   Internal Rotation AROM;15 reps   Flexion AROM;15 reps  ABduction AROM;15 reps   Shoulder Exercises: ROM/Strengthening   UBE (Upper Arm Bike) Level 2 3' forward 3' reverse   Over Head Lace 3'   Proximal Shoulder Strengthening, Supine 15X with 2#   Proximal Shoulder Strengthening, Seated 15X no weight   Shoulder Exercises: Stretch   Other Shoulder Stretches Wall stretch; ER; 1 set; 30" hold   Other Shoulder Stretches Wall stretch; horizontal abduction; 1 set; 30" hold   Manual Therapy   Manual Therapy Myofascial release   Myofascial Release muscle energy technique to right anterior deltoid to relax tone and muscle spasm and improve range of motion.                   OT Short Term Goals - 05/31/14 1149    OT SHORT TERM GOAL #1   Title Patient will be  educated on HEP.   OT SHORT TERM GOAL #2   Title Patient will increase PROM of right shoulder to [facility] to increase ability to perform dressing tasks with less difficulty.   OT SHORT TERM GOAL #3   Title Patient will report a pain level of 1/10 or less with daily tasks.   OT SHORT TERM GOAL #4   Title Patient will increase right shoulder strength to 3/5 to increase ability to wash hair.    Status On-going   OT SHORT TERM GOAL #5   Title Patient will decrease fascial restrictions from max to mod amount.           OT Long Term Goals - 05/26/14 1057    OT LONG TERM GOAL #1   Title Patient will return to highest level of independence with BADL and leisure tasks.    Status On-going   OT LONG TERM GOAL #2   Title Patient will increase AROM of right shoulder to [facility] to increase ability reach into overhead cabinets with less difficulty.   Status On-going   OT LONG TERM GOAL #3   Title Patient will increase right shoulder strength 4/5 to increase ability to complete daily tasks with less difficulty.    Status On-going   OT LONG TERM GOAL #4   Title Patient will decrease fascial restrictions from mod to min amount.   Status On-going               Plan - 06/11/14 1059    Clinical Impression Statement A: Educated patient on ER and horizontal abduction stretch and suggested completing at home.    Plan P: Attempt 1# handweight standing. Focus on form.         Problem List Patient Active Problem List   Diagnosis Date Noted  . Rotator cuff tear arthropathy of right shoulder 03/12/2014  . S/P shoulder replacement 03/11/2014  . Chest discomfort 04/20/2013  . Esophageal reflux 09/07/2012  . Essential hypertension, benign 03/06/2011  . Mixed hyperlipidemia 03/06/2011  . Impaired glucose tolerance 03/06/2011  . Asthma 03/06/2011    Ailene Ravel, OTR/L,CBIS  [phone]  06/11/2014, 11:04 AM  [place], [place], [address] Phone: [phone]   Fax:  [phone]

## 2014-06-14 ENCOUNTER — Ambulatory Visit (HOSPITAL_COMMUNITY)
Admission: RE | Admit: 2014-06-14 | Discharge: 2014-06-14 | Disposition: A | Discharge status: Home / Self Care | Payer: Medicare Other | Source: Ambulatory Visit | Attending: Orthopedic Surgery | Admitting: Orthopedic Surgery

## 2014-06-14 DIAGNOSIS — M25611 Stiffness of right shoulder, not elsewhere classified: Secondary | ICD-10-CM | POA: Diagnosis not present

## 2014-06-14 DIAGNOSIS — Z96611 Presence of right artificial shoulder joint: Secondary | ICD-10-CM | POA: Diagnosis not present

## 2014-06-14 DIAGNOSIS — Z471 Aftercare following joint replacement surgery: Secondary | ICD-10-CM | POA: Diagnosis not present

## 2014-06-14 DIAGNOSIS — R531 Weakness: Secondary | ICD-10-CM | POA: Diagnosis not present

## 2014-06-14 NOTE — Therapy (Signed)
[place], [place], [address] Phone: [phone]   Fax:  [phone]  Occupational Therapy Treatment  Patient Details  Name: Breanna Mason MRN: [id number] Date of Birth: 1946/03/02 Referring Provider:  Marin Shutter, MD  Encounter Date: 06/14/2014      OT End of Session - 06/14/14 1134    Visit Number 21   Number of Visits 24   Date for OT Re-Evaluation 07/14/14  mini reassessment 2/17   Authorization Type Medicare Part A   Authorization Time Period Before 26th visit   Authorization - Visit Number 21   Authorization - Number of Visits 26   OT Start Time 1020   OT Stop Time 1108   OT Time Calculation (min) 48 min   Activity Tolerance Patient tolerated treatment well   Behavior During Therapy [facility] for tasks assessed/performed      Past Medical History  Diagnosis Date  . Essential hypertension, benign   . GERD (gastroesophageal reflux disease)   . Asthma   . Mixed hyperlipidemia   . Impaired glucose tolerance   . Osteoporosis   . Anxiety   . Osteopenia   . Reflux   . IFG (impaired fasting glucose)   . Allergy   . PONV (postoperative nausea and vomiting)   . Pneumonia     hx  . Arthritis     Past Surgical History  Procedure Laterality Date  . Cesarean section    . Bilateral tubal ligation    . Shoulder surgery Right     x4  . Colonoscopy N/A 09/17/2013    Procedure: COLONOSCOPY;  Surgeon: Rogene Houston, MD;  [facility];  Service: Endoscopy;  Laterality: N/A;  1200  . Tubal ligation    . Carpal tunnel release Right     30 yrs  . Reverse shoulder arthroplasty Right 03/11/2014    Procedure: RIGHT REVERSE SHOULDER ARTHROPLASTY;  Surgeon: Marin Shutter, MD;  Location: [place];  Service: Orthopedics;  Laterality: Right;    There were no vitals taken for this visit.  Visit Diagnosis:  S/p reverse total shoulder arthroplasty, right  Decreased range of motion of right  shoulder      Subjective Assessment - 06/14/14 1021    Symptoms S:  Im a little sore this am from doing my stretches.   Limitations Supple Protocol: weeks 4-8 (12/3 - 12/31): AAROM and AROM in all planes - do not aggressively push ROM!  Weeks 8-12 (12/31- 1/28) PROM and AROM to tolerance, sidelying, gradual resistance   Currently in Pain? No/denies          [facility] OT Assessment - 06/14/14 0001    Assessment   Diagnosis s/p right shoulder joint reverse joint replacement   Precautions   Precautions Shoulder   Type of Shoulder Precautions PROM, progress to AAROM then AROM.                OT Treatments/Exercises (OP) - 06/14/14 0001    Shoulder Exercises: Supine   Protraction PROM;5 reps;Strengthening;15 reps   Protraction Weight (lbs) 2   Horizontal ABduction PROM;5 reps;Strengthening;15 reps   Horizontal ABduction Weight (lbs) 2   External Rotation PROM;5 reps;Strengthening;15 reps   External Rotation Weight (lbs) 2   Internal Rotation PROM;5 reps;Strengthening;15 reps   Internal Rotation Weight (lbs) 2   Flexion PROM;5 reps;Strengthening;15 reps   Shoulder Flexion Weight (lbs) 2   ABduction PROM;5 reps;Strengthening;15 reps   Shoulder ABduction Weight (lbs)  2   Shoulder Exercises: Standing   Protraction Strengthening;10 reps   Protraction Weight (lbs) 1   Horizontal ABduction Strengthening;10 reps   Horizontal ABduction Weight (lbs) 1   External Rotation Strengthening;10 reps;Limitations   External Rotation Weight (lbs) 1   External Rotation Limitations needs max facilitation to pass neutral   Internal Rotation Strengthening;10 reps   Internal Rotation Weight (lbs) 1   Flexion Strengthening;10 reps   Shoulder Flexion Weight (lbs) 1   Flexion Limitations to 90   ABduction Strengthening;10 reps;Limitations   Shoulder ABduction Weight (lbs) 1   ABduction Limitations required mod pa to use proper technique and vg to let weight lead vs throwing arm up   Shoulder  Exercises: ROM/Strengthening   UBE (Upper Arm Bike) Level 3 3' forward and 3' reverse   Proximal Shoulder Strengthening, Supine 15X with 2#   Proximal Shoulder Strengthening, Seated 10 times with 1# with vg for technique    Ball on Wall 1 minute with shoulder flexed to 90 and 1 minute with shoulder abducted to 90, required min-mod tactile cues and vg to maintain proper body positioning   Manual Therapy   Manual Therapy Myofascial release   Myofascial Release MFR and manual stretching to RUE bicpe, upper arm, trap, and scap regions to decrease fascial restrictions and promote deccreased pain.                  OT Short Term Goals - 05/31/14 1149    OT SHORT TERM GOAL #1   Title Patient will be educated on HEP.   OT SHORT TERM GOAL #2   Title Patient will increase PROM of right shoulder to [facility] to increase ability to perform dressing tasks with less difficulty.   OT SHORT TERM GOAL #3   Title Patient will report a pain level of 1/10 or less with daily tasks.   OT SHORT TERM GOAL #4   Title Patient will increase right shoulder strength to 3/5 to increase ability to wash hair.    Status On-going   OT SHORT TERM GOAL #5   Title Patient will decrease fascial restrictions from max to mod amount.           OT Long Term Goals - 05/26/14 1057    OT LONG TERM GOAL #1   Title Patient will return to highest level of independence with BADL and leisure tasks.    Status On-going   OT LONG TERM GOAL #2   Title Patient will increase AROM of right shoulder to [facility] to increase ability reach into overhead cabinets with less difficulty.   Status On-going   OT LONG TERM GOAL #3   Title Patient will increase right shoulder strength 4/5 to increase ability to complete daily tasks with less difficulty.    Status On-going   OT LONG TERM GOAL #4   Title Patient will decrease fascial restrictions from mod to min amount.   Status On-going               Plan - 06/14/14 1134    Clinical  Impression Statement A:  added weight to standing AROM exercises, patient requires tactile cues and vg to maintain proper body positioning.  added ball on wall for proximal shoulder stability.    Plan P:  decrease amount of facilitation with body positioning for strengthening exercises.         Problem List Patient Active Problem List   Diagnosis Date Noted  . Rotator cuff tear arthropathy of right  shoulder 03/12/2014  . S/P shoulder replacement 03/11/2014  . Chest discomfort 04/20/2013  . Esophageal reflux 09/07/2012  . Essential hypertension, benign 03/06/2011  . Mixed hyperlipidemia 03/06/2011  . Impaired glucose tolerance 03/06/2011  . Asthma 03/06/2011    Vangie Bicker, OTR/L [phone]  06/14/2014, 11:39 AM  [place], [place], [address] Phone: [phone]   Fax:  [phone]

## 2014-06-16 ENCOUNTER — Encounter (HOSPITAL_COMMUNITY): Payer: Self-pay

## 2014-06-16 ENCOUNTER — Ambulatory Visit (HOSPITAL_COMMUNITY)
Admission: RE | Admit: 2014-06-16 | Discharge: 2014-06-16 | Disposition: A | Discharge status: Home / Self Care | Payer: Medicare Other | Source: Ambulatory Visit | Attending: Orthopedic Surgery | Admitting: Orthopedic Surgery

## 2014-06-16 DIAGNOSIS — M25611 Stiffness of right shoulder, not elsewhere classified: Secondary | ICD-10-CM | POA: Diagnosis not present

## 2014-06-16 DIAGNOSIS — Z471 Aftercare following joint replacement surgery: Secondary | ICD-10-CM | POA: Diagnosis not present

## 2014-06-16 DIAGNOSIS — Z96611 Presence of right artificial shoulder joint: Secondary | ICD-10-CM | POA: Diagnosis not present

## 2014-06-16 DIAGNOSIS — R531 Weakness: Secondary | ICD-10-CM

## 2014-06-16 NOTE — Therapy (Signed)
[place], [place], [address] Phone: [phone]   Fax:  [phone]  Occupational Therapy Treatment  Patient Details  Name: Breanna Mason MRN: [id number] Date of Birth: 1945-05-29 Referring Provider:  Marin Shutter, MD  Encounter Date: 06/16/2014      OT End of Session - 06/16/14 1143    Visit Number 22   Number of Visits 24   Date for OT Re-Evaluation 07/14/14  mini reassessment 2/17   Authorization Type Medicare Part A   Authorization Time Period Before 26th visit   Authorization - Visit Number 66   Authorization - Number of Visits 26   OT Start Time 1016   OT Stop Time 1105   OT Time Calculation (min) 49 min   Activity Tolerance Patient tolerated treatment well   Behavior During Therapy [facility] for tasks assessed/performed      Past Medical History  Diagnosis Date  . Essential hypertension, benign   . GERD (gastroesophageal reflux disease)   . Asthma   . Mixed hyperlipidemia   . Impaired glucose tolerance   . Osteoporosis   . Anxiety   . Osteopenia   . Reflux   . IFG (impaired fasting glucose)   . Allergy   . PONV (postoperative nausea and vomiting)   . Pneumonia     hx  . Arthritis     Past Surgical History  Procedure Laterality Date  . Cesarean section    . Bilateral tubal ligation    . Shoulder surgery Right     x4  . Colonoscopy N/A 09/17/2013    Procedure: COLONOSCOPY;  Surgeon: Rogene Houston, MD;  [facility];  Service: Endoscopy;  Laterality: N/A;  1200  . Tubal ligation    . Carpal tunnel release Right     30 yrs  . Reverse shoulder arthroplasty Right 03/11/2014    Procedure: RIGHT REVERSE SHOULDER ARTHROPLASTY;  Surgeon: Marin Shutter, MD;  Location: [place];  Service: Orthopedics;  Laterality: Right;    There were no vitals taken for this visit.  Visit Diagnosis:  S/p reverse total shoulder arthroplasty, right  Decreased range of motion of right  shoulder  Weakness generalized      Subjective Assessment - 06/16/14 1044    Symptoms S: I try to do the exercises slower so I'm not throwing my arm around.    Currently in Pain? No/denies          [facility] OT Assessment - 06/16/14 1045    Assessment   Diagnosis s/p right shoulder joint reverse joint replacement   Precautions   Precautions Shoulder   Type of Shoulder Precautions PROM, progress to AAROM then AROM.                OT Treatments/Exercises (OP) - 06/16/14 1045    Shoulder Exercises: Supine   Protraction PROM;5 reps;Strengthening;15 reps   Protraction Weight (lbs) 2   Horizontal ABduction PROM;5 reps;Strengthening;15 reps   Horizontal ABduction Weight (lbs) 2   External Rotation PROM;5 reps;Strengthening;15 reps   External Rotation Weight (lbs) 2   Internal Rotation PROM;5 reps;Strengthening;15 reps   Internal Rotation Weight (lbs) 2   Flexion PROM;5 reps;Strengthening;15 reps   Shoulder Flexion Weight (lbs) 2   ABduction PROM;5 reps;Strengthening;15 reps   Shoulder ABduction Weight (lbs) 2   Shoulder Exercises: Standing   Protraction Strengthening;10 reps   Protraction Weight (lbs) 1   Horizontal ABduction Strengthening;10 reps   Horizontal ABduction Weight (  lbs) 1   External Rotation Strengthening;10 reps;Limitations   External Rotation Weight (lbs) 1   Internal Rotation Strengthening;10 reps   Internal Rotation Weight (lbs) 1   Flexion Strengthening;10 reps   Shoulder Flexion Weight (lbs) 1   Flexion Limitations to 90   ABduction Strengthening;10 reps;Limitations   Shoulder ABduction Weight (lbs) 1   ABduction Limitations vc's to not throw arm up   Shoulder Exercises: ROM/Strengthening   UBE (Upper Arm Bike) Level 3 3' forward and 3' reverse   Proximal Shoulder Strengthening, Supine 15X with 2#   Proximal Shoulder Strengthening, Seated 10 times with 1# with vg for technique    Ball on Wall 1 minute with shoulder flexed to 90 and 1 minute with  shoulder abducted to 90, required min-mod tactile cues and vg to maintain proper body positioning   Manual Therapy   Manual Therapy Myofascial release   Myofascial Release MFR and manual stretching to RUE bicpe, upper arm, trap, and scap regions to decrease fascial restrictions and promote deccreased pain.                  OT Short Term Goals - 05/31/14 1149    OT SHORT TERM GOAL #1   Title Patient will be educated on HEP.   OT SHORT TERM GOAL #2   Title Patient will increase PROM of right shoulder to [facility] to increase ability to perform dressing tasks with less difficulty.   OT SHORT TERM GOAL #3   Title Patient will report a pain level of 1/10 or less with daily tasks.   OT SHORT TERM GOAL #4   Title Patient will increase right shoulder strength to 3/5 to increase ability to wash hair.    Status On-going   OT SHORT TERM GOAL #5   Title Patient will decrease fascial restrictions from max to mod amount.           OT Long Term Goals - 05/26/14 1057    OT LONG TERM GOAL #1   Title Patient will return to highest level of independence with BADL and leisure tasks.    Status On-going   OT LONG TERM GOAL #2   Title Patient will increase AROM of right shoulder to [facility] to increase ability reach into overhead cabinets with less difficulty.   Status On-going   OT LONG TERM GOAL #3   Title Patient will increase right shoulder strength 4/5 to increase ability to complete daily tasks with less difficulty.    Status On-going   OT LONG TERM GOAL #4   Title Patient will decrease fascial restrictions from mod to min amount.   Status On-going               Plan - 06/16/14 1143    Clinical Impression Statement A: Pt still required min vc's for proper body positioning during standing exercises.    Plan P: Mini  reassessment. Add theraband exercises for RUE.        Problem List Patient Active Problem List   Diagnosis Date Noted  . Rotator cuff tear arthropathy of right  shoulder 03/12/2014  . S/P shoulder replacement 03/11/2014  . Chest discomfort 04/20/2013  . Esophageal reflux 09/07/2012  . Essential hypertension, benign 03/06/2011  . Mixed hyperlipidemia 03/06/2011  . Impaired glucose tolerance 03/06/2011  . Asthma 03/06/2011    Ailene Ravel, OTR/L,CBIS  [phone]  06/16/2014, 11:45 AM  [place], [place], [address] Phone: [phone]  Fax:  [phone]

## 2014-06-22 ENCOUNTER — Ambulatory Visit (HOSPITAL_COMMUNITY)
Admission: RE | Admit: 2014-06-22 | Discharge: 2014-06-22 | Disposition: A | Discharge status: Home / Self Care | Payer: Medicare Other | Source: Ambulatory Visit | Attending: Orthopedic Surgery | Admitting: Orthopedic Surgery

## 2014-06-22 ENCOUNTER — Encounter (HOSPITAL_COMMUNITY): Payer: Self-pay

## 2014-06-22 DIAGNOSIS — M25611 Stiffness of right shoulder, not elsewhere classified: Secondary | ICD-10-CM

## 2014-06-22 DIAGNOSIS — Z471 Aftercare following joint replacement surgery: Secondary | ICD-10-CM | POA: Diagnosis not present

## 2014-06-22 DIAGNOSIS — Z96611 Presence of right artificial shoulder joint: Secondary | ICD-10-CM | POA: Diagnosis not present

## 2014-06-22 DIAGNOSIS — R531 Weakness: Secondary | ICD-10-CM

## 2014-06-22 NOTE — Therapy (Addendum)
[place], [place], [address] Phone: [phone]   Fax:  [phone]  Occupational Therapy Reassessment and Treatment  Patient Details  Name: Breanna Mason MRN: [id number] Date of Birth: [birth year] Referring Provider:  Marin Shutter, MD  Encounter Date: 06/22/2014      OT End of Session - 06/22/14 1059    Visit Number 23   Number of Visits 24   Authorization Type Medicare Part A   Authorization Time Period Before 26th visit   Authorization - Visit Number 23   Authorization - Number of Visits 26   OT Start Time 1015   OT Stop Time 1055   OT Time Calculation (min) 40 min   Activity Tolerance Patient tolerated treatment well   Behavior During Therapy [facility] for tasks assessed/performed      Past Medical History  Diagnosis Date  . Essential hypertension, benign   . GERD (gastroesophageal reflux disease)   . Asthma   . Mixed hyperlipidemia   . Impaired glucose tolerance   . Osteoporosis   . Anxiety   . Osteopenia   . Reflux   . IFG (impaired fasting glucose)   . Allergy   . PONV (postoperative nausea and vomiting)   . Pneumonia     hx  . Arthritis     Past Surgical History  Procedure Laterality Date  . Cesarean section    . Bilateral tubal ligation    . Shoulder surgery Right     x4  . Colonoscopy N/A 09/17/2013    Procedure: COLONOSCOPY;  Surgeon: Rogene Houston, MD;  [facility];  Service: Endoscopy;  Laterality: N/A;  1200  . Tubal ligation    . Carpal tunnel release Right     30 yrs  . Reverse shoulder arthroplasty Right 03/11/2014    Procedure: RIGHT REVERSE SHOULDER ARTHROPLASTY;  Surgeon: Marin Shutter, MD;  Location: [place];  Service: Orthopedics;  Laterality: Right;    There were no vitals taken for this visit.  Visit Diagnosis:  Weakness generalized  Decreased range of motion of right shoulder      Subjective Assessment - 06/22/14 1059    Symptoms S: I work at  home really hard trying to get my arm to go out to the side.    Special Tests FOTO score: 65/100   Currently in Pain? No/denies          [facility] OT Assessment - 06/22/14 1020    Assessment   Diagnosis s/p right shoulder joint reverse joint replacement   Precautions   Precautions Shoulder   Type of Shoulder Precautions PROM, progress to AAROM then AROM.    AROM   Right Shoulder Flexion 153 Degrees  last progress note: 126   Right Shoulder ABduction 180 Degrees  last progress note: 94   Right Shoulder Internal Rotation 90 Degrees  same at last progress note   Right Shoulder External Rotation 20 Degrees  last progress note: 0   PROM   Overall PROM Comments Assessed supine. IR/ER adducted   Right Shoulder Flexion 166 Degrees  last progress note: 163   Right Shoulder ABduction 180 Degrees  same at last progress note   Right Shoulder Internal Rotation 90 Degrees  same at last progress note   Right Shoulder External Rotation 75 Degrees  last progress note: 50   Strength   Overall Strength Comments Assessed standing. IR/ER adducted    Right Shoulder Flexion 3/5  same  at last progress note   Right Shoulder ABduction 3-/5  same at last progress note   Right Shoulder Internal Rotation 4/5  last progress note: 4-/5   Right Shoulder External Rotation 3-/5  same at last progress note.                 OT Treatments/Exercises (OP) - 06/22/14 1035    Shoulder Exercises: Supine   Protraction PROM;5 reps;Strengthening;15 reps   Protraction Weight (lbs) 2   Horizontal ABduction PROM;5 reps;Strengthening;15 reps   Horizontal ABduction Weight (lbs) 2   External Rotation PROM;5 reps;Strengthening;15 reps   External Rotation Weight (lbs) 2   Internal Rotation PROM;5 reps;Strengthening;15 reps   Internal Rotation Weight (lbs) 2   Flexion PROM;5 reps;Strengthening;15 reps   Shoulder Flexion Weight (lbs) 2   ABduction PROM;5 reps;Strengthening;15 reps   Shoulder ABduction Weight  (lbs) 2   Shoulder Exercises: Standing   Protraction Strengthening;10 reps   Protraction Weight (lbs) 1   Horizontal ABduction Strengthening;10 reps   Horizontal ABduction Weight (lbs) 1   External Rotation AAROM;10 reps   Internal Rotation Strengthening;10 reps   Internal Rotation Weight (lbs) 1   Flexion Strengthening;10 reps   Shoulder Flexion Weight (lbs) 1   ABduction Strengthening;10 reps;Limitations   Shoulder ABduction Weight (lbs) 1   Manual Therapy   Manual Therapy Myofascial release   Myofascial Release MFR and manual stretching to RUE bicpe, upper arm, trap, and scap regions to decrease fascial restrictions and promote deccreased pain.                 OT Short Term Goals - 06/22/14 1038    OT SHORT TERM GOAL #1   Title Patient will be educated on HEP.   OT SHORT TERM GOAL #2   Title Patient will increase PROM of right shoulder to [facility] to increase ability to perform dressing tasks with less difficulty.   OT SHORT TERM GOAL #3   Title Patient will report a pain level of 1/10 or less with daily tasks.   OT SHORT TERM GOAL #4   Title Patient will increase right shoulder strength to 3/5 to increase ability to wash hair.    Status Achieved   OT SHORT TERM GOAL #5   Title Patient will decrease fascial restrictions from max to mod amount.           OT Long Term Goals - 06/22/14 1039    OT LONG TERM GOAL #1   Title Patient will return to highest level of independence with BADL and leisure tasks.    Status Achieved   OT LONG TERM GOAL #2   Title Patient will increase AROM of right shoulder to [facility] to increase ability reach into overhead cabinets with less difficulty.   Status Achieved   OT LONG TERM GOAL #3   Title Patient will increase right shoulder strength 4/5 to increase ability to complete daily tasks with less difficulty.    Status Not Met   OT LONG TERM GOAL #4   Title Patient will decrease fascial restrictions from mod to min amount.   Status  Achieved               Plan - 06/22/14 1059    Clinical Impression Statement A: Mini reassessment completed this date. patient met all therapy goals except her strength goal. Patient continues to have deficits with shoulder flexion and abduction stretngth. Patient was educated to continue completing HEP at home and is in agreement with discharge  from therapy.   Plan P: Discharge from therapy.           G-Codes - 2014-07-08 1102    Functional Assessment Tool Used FOTO score: 65/100 (35% impaired)   Functional Limitation Carrying, moving and handling objects   Carrying, Moving and Handling Objects Current Status (M4268) At least 20 percent but less than 40 percent impaired, limited or restricted   Carrying, Moving and Handling Objects Goal Status (T4196) At least 20 percent but less than 40 percent impaired, limited or restricted   Carrying, Moving and Handling Objects Discharge Status [phone]) At least 20 percent but less than 40 percent impaired, limited or restricted      Problem List Patient Active Problem List   Diagnosis Date Noted  . Rotator cuff tear arthropathy of right shoulder 03/12/2014  . S/P shoulder replacement 03/11/2014  . Chest discomfort 04/20/2013  . Esophageal reflux 09/07/2012  . Essential hypertension, benign 03/06/2011  . Mixed hyperlipidemia 03/06/2011  . Impaired glucose tolerance 03/06/2011  . Asthma 03/06/2011    .OCCUPATIONAL THERAPY DISCHARGE SUMMARY  Visits from Start of Care: 23  Current functional level related to goals / functional outcomes: See goals above   Remaining deficits: See clinical impression statement   Education / Equipment: Wall stretches, AROM, strengthening exercises, AAROM Plan: Patient agrees to discharge.  Patient goals were met. Patient is being discharged due to being pleased with the current functional level.  ?????       Ailene Ravel, OTR/L,CBIS  [phone]  08-Jul-2014, 1:09 PM   [place] [address] [place], [place], [address] Phone: [phone]   Fax:  [phone]

## 2014-06-22 NOTE — Addendum Note (Signed)
Encounter addended by: Debby Bud, OT on: 06/22/2014  1:09 PM<BR>     Documentation filed: Clinical Notes, Inpatient Document Flowsheet

## 2014-06-25 ENCOUNTER — Encounter (HOSPITAL_COMMUNITY): Payer: Medicare Other

## 2014-06-28 ENCOUNTER — Encounter (HOSPITAL_COMMUNITY): Payer: Medicare Other

## 2014-06-30 ENCOUNTER — Encounter (HOSPITAL_COMMUNITY): Payer: Medicare Other

## 2014-07-05 ENCOUNTER — Encounter (HOSPITAL_COMMUNITY): Payer: Medicare Other

## 2014-08-02 ENCOUNTER — Other Ambulatory Visit: Payer: Self-pay | Admitting: Family Medicine

## 2014-08-10 ENCOUNTER — Telehealth: Payer: Self-pay | Admitting: Family Medicine

## 2014-08-10 DIAGNOSIS — Z79899 Other long term (current) drug therapy: Secondary | ICD-10-CM

## 2014-08-10 DIAGNOSIS — E785 Hyperlipidemia, unspecified: Secondary | ICD-10-CM

## 2014-08-10 NOTE — Telephone Encounter (Signed)
Lab orders please  Call when ready aware needs to go to [facility]  02/10/14  Lip, Hep func, BMP

## 2014-08-10 NOTE — Telephone Encounter (Signed)
Lip liv 

## 2014-08-10 NOTE — Telephone Encounter (Signed)
Bloodwork ordered. Patient was notified.  

## 2014-08-24 DIAGNOSIS — Z79899 Other long term (current) drug therapy: Secondary | ICD-10-CM | POA: Diagnosis not present

## 2014-08-24 DIAGNOSIS — E785 Hyperlipidemia, unspecified: Secondary | ICD-10-CM | POA: Diagnosis not present

## 2014-08-25 LAB — HEPATIC FUNCTION PANEL
ALT: 20 IU/L (ref 0–32)
AST: 15 IU/L (ref 0–40)
Albumin: 4.3 g/dL (ref 3.6–4.8)
Alkaline Phosphatase: 53 IU/L (ref 39–117)
BILIRUBIN, DIRECT: 0.11 mg/dL (ref 0.00–0.40)
Bilirubin Total: 0.5 mg/dL (ref 0.0–1.2)
Total Protein: 6.9 g/dL (ref 6.0–8.5)

## 2014-08-25 LAB — LIPID PANEL
CHOL/HDL RATIO: 3.5 ratio (ref 0.0–4.4)
Cholesterol, Total: 205 mg/dL — ABNORMAL HIGH (ref 100–199)
HDL: 58 mg/dL (ref >39)
LDL Calculated: 122 mg/dL — ABNORMAL HIGH (ref 0–99)
Triglycerides: 126 mg/dL (ref 0–149)
VLDL Cholesterol Cal: 25 mg/dL (ref 5–40)

## 2014-09-03 ENCOUNTER — Telehealth: Payer: Self-pay | Admitting: Family Medicine

## 2014-09-03 DIAGNOSIS — Z471 Aftercare following joint replacement surgery: Secondary | ICD-10-CM | POA: Diagnosis not present

## 2014-09-03 DIAGNOSIS — Z96611 Presence of right artificial shoulder joint: Secondary | ICD-10-CM | POA: Diagnosis not present

## 2014-09-03 MED ORDER — HYDROCORTISONE 2.5 % RE CREA: 1.0000 "application " | TOPICAL_CREAM | Freq: Two times a day (BID) | RECTAL | Status: DC

## 2014-09-03 NOTE — Telephone Encounter (Signed)
Ok lets with 3 ref

## 2014-09-03 NOTE — Telephone Encounter (Signed)
Pt is wanting to know if proctozone hc 2.5% cream 30 mg can be called in for  Her. She is having problems with hemmroids.   [facility] [place]

## 2014-09-03 NOTE — Telephone Encounter (Signed)
Left message on voicemail notifying patient that med was sent to pharmacy.

## 2014-09-08 ENCOUNTER — Ambulatory Visit (INDEPENDENT_AMBULATORY_CARE_PROVIDER_SITE_OTHER): Payer: Medicare Other | Admitting: Family Medicine

## 2014-09-08 ENCOUNTER — Encounter: Payer: Self-pay | Admitting: Family Medicine

## 2014-09-08 VITALS — BP 134/80 | Ht 63.0 in | Wt 143.0 lb

## 2014-09-08 DIAGNOSIS — I10 Essential (primary) hypertension: Secondary | ICD-10-CM

## 2014-09-08 DIAGNOSIS — R7301 Impaired fasting glucose: Secondary | ICD-10-CM

## 2014-09-08 DIAGNOSIS — E785 Hyperlipidemia, unspecified: Secondary | ICD-10-CM | POA: Diagnosis not present

## 2014-09-08 DIAGNOSIS — J4520 Mild intermittent asthma, uncomplicated: Secondary | ICD-10-CM | POA: Diagnosis not present

## 2014-09-08 MED ORDER — ALENDRONATE SODIUM 70 MG PO TABS: ORAL_TABLET | ORAL | Status: DC

## 2014-09-08 MED ORDER — ENALAPRIL MALEATE 10 MG PO TABS: 10.0000 mg | ORAL_TABLET | Freq: Every day | ORAL | Status: DC

## 2014-09-08 MED ORDER — BUDESONIDE-FORMOTEROL FUMARATE 160-4.5 MCG/ACT IN AERO: 1.0000 | INHALATION_SPRAY | Freq: Two times a day (BID) | RESPIRATORY_TRACT | Status: DC

## 2014-09-08 MED ORDER — PRAVASTATIN SODIUM 40 MG PO TABS: 40.0000 mg | ORAL_TABLET | Freq: Every day | ORAL | Status: DC

## 2014-09-08 MED ORDER — ALPRAZOLAM 0.5 MG PO TABS: 0.5000 mg | ORAL_TABLET | Freq: Two times a day (BID) | ORAL | Status: DC | PRN

## 2014-09-08 MED ORDER — ALBUTEROL SULFATE HFA 108 (90 BASE) MCG/ACT IN AERS: 2.0000 | INHALATION_SPRAY | Freq: Four times a day (QID) | RESPIRATORY_TRACT | Status: DC | PRN

## 2014-09-08 NOTE — Progress Notes (Signed)
   Subjective:    Patient ID: Breanna Mason, female    DOB: 03/08/1946, 69 y.o.   MRN: [id number]  Hyperlipidemia This is a chronic problem. The current episode started more than 1 year ago. There are no compliance problems.    Had bloodwork done on 4/19.  Pt states no concerns today.  Results for orders placed or performed in visit on 08/10/14  Lipid panel  Result Value Ref Range   Cholesterol, Total 205 (H) 100 - 199 mg/dL   Triglycerides 126 0 - 149 mg/dL   HDL 58 >39 mg/dL   VLDL Cholesterol Cal 25 5 - 40 mg/dL   LDL Calculated 122 (H) 0 - 99 mg/dL   Chol/HDL Ratio 3.5 0.0 - 4.4 ratio units  Hepatic function panel  Result Value Ref Range   Total Protein 6.9 6.0 - 8.5 g/dL   Albumin 4.3 3.6 - 4.8 g/dL   Bilirubin Total 0.5 0.0 - 1.2 mg/dL   Bilirubin, Direct 0.11 0.00 - 0.40 mg/dL   Alkaline Phosphatase 53 39 - 117 IU/L   AST 15 0 - 40 IU/L   ALT 20 0 - 32 IU/L    No obs s e's with the meds  bp generally good  Walking three or four d per wk Has dev a hemorrhoed problem  This has been out for some time since constip  Eating splenda  Asthma very stavle sticks with the symbicort  Rarely uses  albuterol. Next  Still uses Xanax at night on occasion help sleep. Next  Compliant with blood pressure medicine. No obvious side effects per watching salt intake. Review of Systems    no headache no chest pain no back pain abdominal pain no change in bowel habits no blood in stool ROS otherwise negative Objective:   Physical Exam  Alert no acute distress vital signs stable blood pressure good on repeat HEENT normal. Lungs clear heart regular in rhythm. Ankles without edema.      Assessment & Plan:  Impression 1 hypertension good control discussed #2 hyperlipidemia controlled good discuss #3 asthma clinically stable on preventive agents. Plan diet exercise discussed. Maintain same medications. Also maintain Xanax when necessary for sleep. Appropriate use discussed  W[facility]

## 2014-09-13 ENCOUNTER — Telehealth: Payer: Self-pay | Admitting: Obstetrics and Gynecology

## 2014-09-13 NOTE — Telephone Encounter (Signed)
Pt states that the hemorrhoids are not bothering her but she knows that they are there. Pt denies any pain at all. Pt states that she does treat them but she is not having any issues. Pt states that she has an appointment on the 3rd of June with Dr.Ferguson. I advised the pt that we could make an appointment for her to come in to be seen or she could hold off until her appointment in June. The pt opted to wait until her appointment. I advised the pt that if she starts having trouble to call us back and we would get her worked in. Pt verbalized understanding.

## 2014-09-15 DIAGNOSIS — Z471 Aftercare following joint replacement surgery: Secondary | ICD-10-CM | POA: Diagnosis not present

## 2014-10-05 ENCOUNTER — Encounter: Payer: Self-pay | Admitting: Family Medicine

## 2014-10-05 NOTE — Telephone Encounter (Signed)
Patient states that the hemarrhoid is not painful at this time she can just feel that it is there. Advised patient that Dr Richardson Landry stated if it painful and swollen she needs office visit here to [facility]- if not painful or swollen may wait till appt next week with GYN. Patient verbalized understanding.

## 2014-10-08 ENCOUNTER — Other Ambulatory Visit: Payer: Medicare Other | Admitting: Obstetrics and Gynecology

## 2014-10-14 ENCOUNTER — Other Ambulatory Visit: Payer: Medicare Other | Admitting: Obstetrics and Gynecology

## 2014-10-21 DIAGNOSIS — D485 Neoplasm of uncertain behavior of skin: Secondary | ICD-10-CM | POA: Diagnosis not present

## 2014-10-21 DIAGNOSIS — D2272 Melanocytic nevi of left lower limb, including hip: Secondary | ICD-10-CM | POA: Diagnosis not present

## 2014-10-21 DIAGNOSIS — L821 Other seborrheic keratosis: Secondary | ICD-10-CM | POA: Diagnosis not present

## 2014-10-21 DIAGNOSIS — D225 Melanocytic nevi of trunk: Secondary | ICD-10-CM | POA: Diagnosis not present

## 2014-10-25 ENCOUNTER — Other Ambulatory Visit (HOSPITAL_COMMUNITY)
Admission: RE | Admit: 2014-10-25 | Discharge: 2014-10-25 | Disposition: A | Discharge status: Home / Self Care | Payer: Medicare Other | Source: Ambulatory Visit | Attending: Obstetrics and Gynecology | Admitting: Obstetrics and Gynecology

## 2014-10-25 ENCOUNTER — Other Ambulatory Visit: Payer: Self-pay | Admitting: Family Medicine

## 2014-10-25 ENCOUNTER — Ambulatory Visit (INDEPENDENT_AMBULATORY_CARE_PROVIDER_SITE_OTHER): Payer: Medicare Other | Admitting: Obstetrics and Gynecology

## 2014-10-25 ENCOUNTER — Encounter: Payer: Self-pay | Admitting: Obstetrics and Gynecology

## 2014-10-25 VITALS — BP 130/80 | Ht 63.0 in | Wt 141.0 lb

## 2014-10-25 DIAGNOSIS — Z01419 Encounter for gynecological examination (general) (routine) without abnormal findings: Secondary | ICD-10-CM | POA: Insufficient documentation

## 2014-10-25 DIAGNOSIS — Z1151 Encounter for screening for human papillomavirus (HPV): Secondary | ICD-10-CM | POA: Diagnosis not present

## 2014-10-25 DIAGNOSIS — Z124 Encounter for screening for malignant neoplasm of cervix: Secondary | ICD-10-CM | POA: Insufficient documentation

## 2014-10-25 DIAGNOSIS — Z0000 Encounter for general adult medical examination without abnormal findings: Secondary | ICD-10-CM

## 2014-10-25 NOTE — Progress Notes (Signed)
Patient ID: Breanna Mason, female   DOB: 24-Jun-1945, 69 y.o.   MRN: [id number] Pt here today for annual exam. Pt states that she has a small that has been giving her some trouble. Pt denies any other problems at this time.

## 2014-10-25 NOTE — Progress Notes (Signed)
Patient ID: Breanna Mason, female   DOB: 03/04/1946, 69 y.o.   MRN: [id number]  Assessment:  Annual Gyn Exam. Atrophic vulvar dystrophy. S/P hemorrhoid resolved.   Plan:  1. pap smear done, next pap due in 3 years 2. return annually or prn 3    Annual mammogram advised Subjective:  Breanna Mason is a 69 y.o. female No obstetric history on file. who presents for annual exam. No LMP recorded. Patient is postmenopausal. The patient has no major complaints today. She does however mention a small hemorrhoid that has been gradually shrinking. She states it hasn't been bothering her, as she has no pain. Patient is not currently sexually active at this time.  She had a fissure that required revision surgically in past The following portions of the patient's history were reviewed and updated as appropriate: allergies, current medications, past family history, past medical history, past social history, past surgical history and problem list. Past Medical History  Diagnosis Date  . Essential hypertension, benign   . GERD (gastroesophageal reflux disease)   . Asthma   . Mixed hyperlipidemia   . Impaired glucose tolerance   . Osteoporosis   . Anxiety   . Osteopenia   . Reflux   . IFG (impaired fasting glucose)   . Allergy   . PONV (postoperative nausea and vomiting)   . Pneumonia     hx  . Arthritis     Past Surgical History  Procedure Laterality Date  . Cesarean section    . Bilateral tubal ligation    . Shoulder surgery Right     x4  . Colonoscopy N/A 09/17/2013    Procedure: COLONOSCOPY;  Surgeon: Rogene Houston, MD;  [facility];  Service: Endoscopy;  Laterality: N/A;  1200  . Tubal ligation    . Carpal tunnel release Right     30 yrs  . Reverse shoulder arthroplasty Right 03/11/2014    Procedure: RIGHT REVERSE SHOULDER ARTHROPLASTY;  Surgeon: Marin Shutter, MD;  Location: [place];  Service: Orthopedics;  Laterality: Right;     Current outpatient  prescriptions:  .  albuterol (PROVENTIL HFA;VENTOLIN HFA) 108 (90 BASE) MCG/ACT inhaler, Inhale 2 puffs into the lungs every 6 (six) hours as needed for wheezing or shortness of breath., Disp: 1 Inhaler, Rfl: 5 .  alendronate (FOSAMAX) 70 MG tablet, TAKE ONE TABLET BY MOUTH ONCE A WEEK, Disp: 12 tablet, Rfl: 1 .  ALPRAZolam (XANAX) 0.5 MG tablet, Take 1 tablet (0.5 mg total) by mouth 2 (two) times daily as needed for anxiety., Disp: 30 tablet, Rfl: 5 .  budesonide-formoterol (SYMBICORT) 160-4.5 MCG/ACT inhaler, Inhale 1 puff into the lungs 2 (two) times daily., Disp: 3 Inhaler, Rfl: 1 .  calcium-vitamin D (OSCAL WITH D) 500-200 MG-UNIT per tablet, Take 1 tablet by mouth daily.  , Disp: , Rfl:  .  docusate sodium (COLACE) 50 MG capsule, Take 50 mg by mouth as needed for mild constipation. duralax, Disp: , Rfl:  .  enalapril (VASOTEC) 10 MG tablet, Take 1 tablet (10 mg total) by mouth daily with breakfast., Disp: 90 tablet, Rfl: 1 .  hydrocortisone (ANUSOL-HC) 2.5 % rectal cream, Place 1 application rectally 2 (two) times daily., Disp: 30 g, Rfl: 3 .  meclizine (ANTIVERT) 25 MG tablet, TAKE ONE TABLET BY MOUTH ONCE DAILY AS NEEDED FOR DIZZINESS, Disp: 30 tablet, Rfl: 0 .  pravastatin (PRAVACHOL) 40 MG tablet, Take 1 tablet (40 mg total) by mouth at bedtime., Disp: 90 tablet,  Rfl: 1  Review of Systems Constitutional: negative Gastrointestinal: negative Genitourinary: negative A complete 10 system review of systems was obtained and all systems are negative except as noted in the HPI and PMH.    Objective:  BP 130/80 mmHg  Ht 5\' 3"  (1.6 m)  Wt 141 lb (63.957 kg)  BMI 24.98 kg/m2   BMI: Body mass index is 24.98 kg/(m^2).  General Appearance: Alert, appropriate appearance for age. No acute distress HEENT: Grossly normal Neck / Thyroid:  Cardiovascular: RRR; normal S1, S2, no murmur Lungs: CTA bilaterally Back: No CVAT Breast Exam: No masses or nodes.No dimpling, nipple retraction or  discharge. Gastrointestinal: Soft, non-tender, no masses or organomegaly Pelvic Exam: Vulva and vagina appear normal. Bimanual exam reveals normal uterus and adnexa. External genitalia: normal general appearance  Urinary system: urethral meatus normal Vaginal: normal atrophic tissues, good support Cervix: normal appearance Adnexa: normal bimanual exam Uterus: normal single, nontender  Rectal: resolved external hemorrhoid that appears as a skin tag, good support, no rectocele, guaiac negative Rectovaginal: not indicated Lymphatic Exam: Non-palpable nodes in neck, clavicular, axillary, or inguinal regions Skin: no rash or abnormalities Neurologic: Normal gait and speech, no tremor  Psychiatric: Alert and oriented, appropriate affect.  Urinalysis:Not done  Mallory Shirk. MD Pgr [phone] 3:20 PM  This chart was SCRIBED for Mallory Shirk, MD by Stephania Fragmin, ED Scribe. This patient was seen in room 1 and the patient's care was started at 3:20 PM.  I personally performed the services described in this documentation, which was SCRIBED in my presence. The recorded information has been reviewed and considered accurate. It has been edited as necessary during review. Jonnie Kind, MD

## 2014-10-25 NOTE — Addendum Note (Signed)
Addended by: Farley Ly on: 10/25/2014 03:53 PM   Modules accepted: Orders

## 2014-10-27 LAB — CYTOLOGY - PAP

## 2014-12-21 ENCOUNTER — Other Ambulatory Visit: Payer: Self-pay | Admitting: Obstetrics and Gynecology

## 2014-12-21 DIAGNOSIS — Z1231 Encounter for screening mammogram for malignant neoplasm of breast: Secondary | ICD-10-CM

## 2015-01-17 DIAGNOSIS — L821 Other seborrheic keratosis: Secondary | ICD-10-CM | POA: Diagnosis not present

## 2015-01-17 DIAGNOSIS — D485 Neoplasm of uncertain behavior of skin: Secondary | ICD-10-CM | POA: Diagnosis not present

## 2015-01-21 ENCOUNTER — Ambulatory Visit (HOSPITAL_COMMUNITY)
Admission: RE | Admit: 2015-01-21 | Discharge: 2015-01-21 | Disposition: A | Discharge status: Home / Self Care | Payer: Medicare Other | Source: Ambulatory Visit | Attending: Obstetrics and Gynecology | Admitting: Obstetrics and Gynecology

## 2015-01-21 DIAGNOSIS — Z1231 Encounter for screening mammogram for malignant neoplasm of breast: Secondary | ICD-10-CM | POA: Diagnosis not present

## 2015-01-31 ENCOUNTER — Other Ambulatory Visit: Payer: Self-pay | Admitting: Obstetrics and Gynecology

## 2015-01-31 ENCOUNTER — Other Ambulatory Visit: Payer: Self-pay | Admitting: Family Medicine

## 2015-01-31 DIAGNOSIS — R928 Other abnormal and inconclusive findings on diagnostic imaging of breast: Secondary | ICD-10-CM

## 2015-02-08 ENCOUNTER — Ambulatory Visit (HOSPITAL_COMMUNITY)
Admission: RE | Admit: 2015-02-08 | Discharge: 2015-02-08 | Disposition: A | Discharge status: Home / Self Care | Payer: Medicare Other | Source: Ambulatory Visit | Attending: Obstetrics and Gynecology | Admitting: Obstetrics and Gynecology

## 2015-02-08 ENCOUNTER — Other Ambulatory Visit: Payer: Self-pay | Admitting: Obstetrics and Gynecology

## 2015-02-08 DIAGNOSIS — R928 Other abnormal and inconclusive findings on diagnostic imaging of breast: Secondary | ICD-10-CM

## 2015-02-08 DIAGNOSIS — N63 Unspecified lump in breast: Secondary | ICD-10-CM | POA: Diagnosis not present

## 2015-02-09 ENCOUNTER — Telehealth: Payer: Self-pay | Admitting: Family Medicine

## 2015-02-09 ENCOUNTER — Other Ambulatory Visit: Payer: Self-pay | Admitting: *Deleted

## 2015-02-09 DIAGNOSIS — Z23 Encounter for immunization: Secondary | ICD-10-CM | POA: Diagnosis not present

## 2015-02-09 DIAGNOSIS — I10 Essential (primary) hypertension: Secondary | ICD-10-CM

## 2015-02-09 DIAGNOSIS — E785 Hyperlipidemia, unspecified: Secondary | ICD-10-CM

## 2015-02-09 DIAGNOSIS — Z79899 Other long term (current) drug therapy: Secondary | ICD-10-CM

## 2015-02-09 NOTE — Telephone Encounter (Signed)
Orders ready. Pt notified on vm

## 2015-02-09 NOTE — Telephone Encounter (Signed)
said to please make sure Cholesterol an Sugars are checked

## 2015-02-09 NOTE — Telephone Encounter (Signed)
Lip liv m7 

## 2015-02-09 NOTE — Telephone Encounter (Signed)
Labs please,   Last labs 08/24/14  Lip, Hep  02/10/14  BMP, Lip, Hep

## 2015-02-18 DIAGNOSIS — I10 Essential (primary) hypertension: Secondary | ICD-10-CM | POA: Diagnosis not present

## 2015-02-18 DIAGNOSIS — Z79899 Other long term (current) drug therapy: Secondary | ICD-10-CM | POA: Diagnosis not present

## 2015-02-18 DIAGNOSIS — E785 Hyperlipidemia, unspecified: Secondary | ICD-10-CM | POA: Diagnosis not present

## 2015-02-19 LAB — BASIC METABOLIC PANEL
BUN / CREAT RATIO: 20 (ref 11–26)
BUN: 14 mg/dL (ref 8–27)
CO2: 26 mmol/L (ref 18–29)
Calcium: 9.6 mg/dL (ref 8.7–10.3)
Chloride: 101 mmol/L (ref 97–108)
Creatinine, Ser: 0.69 mg/dL (ref 0.57–1.00)
GFR, EST AFRICAN AMERICAN: 103 mL/min/{1.73_m2} (ref >59)
GFR, EST NON AFRICAN AMERICAN: 90 mL/min/{1.73_m2} (ref >59)
Glucose: 106 mg/dL — ABNORMAL HIGH (ref 65–99)
POTASSIUM: 4.7 mmol/L (ref 3.5–5.2)
SODIUM: 141 mmol/L (ref 134–144)

## 2015-02-19 LAB — LIPID PANEL
CHOLESTEROL TOTAL: 210 mg/dL — AB (ref 100–199)
Chol/HDL Ratio: 3.7 ratio units (ref 0.0–4.4)
HDL: 57 mg/dL (ref >39)
LDL Calculated: 129 mg/dL — ABNORMAL HIGH (ref 0–99)
TRIGLYCERIDES: 120 mg/dL (ref 0–149)
VLDL Cholesterol Cal: 24 mg/dL (ref 5–40)

## 2015-02-19 LAB — HEPATIC FUNCTION PANEL
ALK PHOS: 57 IU/L (ref 39–117)
ALT: 19 IU/L (ref 0–32)
AST: 14 IU/L (ref 0–40)
Albumin: 4.6 g/dL (ref 3.6–4.8)
BILIRUBIN TOTAL: 0.5 mg/dL (ref 0.0–1.2)
BILIRUBIN, DIRECT: 0.12 mg/dL (ref 0.00–0.40)
Total Protein: 6.7 g/dL (ref 6.0–8.5)

## 2015-02-25 ENCOUNTER — Encounter: Payer: Self-pay | Admitting: Family Medicine

## 2015-02-25 ENCOUNTER — Ambulatory Visit (INDEPENDENT_AMBULATORY_CARE_PROVIDER_SITE_OTHER): Payer: Medicare Other | Admitting: Family Medicine

## 2015-02-25 VITALS — BP 122/82 | Ht 63.0 in | Wt 145.8 lb

## 2015-02-25 DIAGNOSIS — J4520 Mild intermittent asthma, uncomplicated: Secondary | ICD-10-CM | POA: Diagnosis not present

## 2015-02-25 DIAGNOSIS — E785 Hyperlipidemia, unspecified: Secondary | ICD-10-CM | POA: Diagnosis not present

## 2015-02-25 DIAGNOSIS — I10 Essential (primary) hypertension: Secondary | ICD-10-CM | POA: Diagnosis not present

## 2015-02-25 MED ORDER — ALENDRONATE SODIUM 70 MG PO TABS: ORAL_TABLET | ORAL | Status: DC

## 2015-02-25 MED ORDER — ALPRAZOLAM 0.5 MG PO TABS: 0.5000 mg | ORAL_TABLET | Freq: Two times a day (BID) | ORAL | Status: DC | PRN

## 2015-02-25 MED ORDER — ENALAPRIL MALEATE 10 MG PO TABS: 10.0000 mg | ORAL_TABLET | Freq: Every day | ORAL | Status: DC

## 2015-02-25 MED ORDER — PRAVASTATIN SODIUM 40 MG PO TABS: 40.0000 mg | ORAL_TABLET | Freq: Every day | ORAL | Status: DC

## 2015-02-25 MED ORDER — BUDESONIDE-FORMOTEROL FUMARATE 160-4.5 MCG/ACT IN AERO: 1.0000 | INHALATION_SPRAY | Freq: Two times a day (BID) | RESPIRATORY_TRACT | Status: DC

## 2015-02-25 NOTE — Progress Notes (Signed)
   Subjective:    Patient ID: Breanna Mason, female    DOB: October 24, 1945, 69 y.o.   MRN: [id number]  Hypertension This is a chronic problem. The current episode started more than 1 year ago. Risk factors for coronary artery disease include dyslipidemia and post-menopausal state. Treatments tried: vasotec. There are no compliance problems.         patient compliant with the medications. [place] reviewed. No obvious side effects. Trying to watch fats in diet.   patient compliant with Symbicort. Does not miss a dose. Has helped wheezing considerably. Rarely uses an inhaler.   Results for orders placed or performed in visit on 02/09/15  Lipid panel  Result Value Ref Range   Cholesterol, Total 210 (H) 100 - 199 mg/dL   Triglycerides 120 0 - 149 mg/dL   HDL 57 >39 mg/dL   VLDL Cholesterol Cal 24 5 - 40 mg/dL   LDL Calculated 129 (H) 0 - 99 mg/dL   Chol/HDL Ratio 3.7 0.0 - 4.4 ratio units  Hepatic function panel  Result Value Ref Range   Total Protein 6.7 6.0 - 8.5 g/dL   Albumin 4.6 3.6 - 4.8 g/dL   Bilirubin Total 0.5 0.0 - 1.2 mg/dL   Bilirubin, Direct 0.12 0.00 - 0.40 mg/dL   Alkaline Phosphatase 57 39 - 117 IU/L   AST 14 0 - 40 IU/L   ALT 19 0 - 32 IU/L  Basic metabolic panel  Result Value Ref Range   Glucose 106 (H) 65 - 99 mg/dL   BUN 14 8 - 27 mg/dL   Creatinine, Ser 0.69 0.57 - 1.00 mg/dL   GFR calc non Af Amer 90 >59 mL/min/1.73   GFR calc Af Amer 103 >59 mL/min/1.73   BUN/Creatinine Ratio 20 11 - 26   Sodium 141 134 - 144 mmol/L   Potassium 4.7 3.5 - 5.2 mmol/L   Chloride 101 97 - 108 mmol/L   CO2 26 18 - 29 mmol/L   Calcium 9.6 8.7 - 10.3 mg/dL   Tries to walk regulaly  Has had the flu shot fitth of oct   Review of Systems  no headache no chest pain no back pain no abdominal pain no change in bowel habits complete ROS otherwise negative    Objective:   Physical Exam  alert vital stable blood pressure good on repeat. H&T normal. Lungs clear. Heart regular  rate and rhythm. Ankles without edema.       Assessment & Plan:   impression hypertension good control maintain same dose of medications #2 hyperlipidemia excellent control maintain same dose medications diet discussed #3 chronic insomnia with high need from Xanax refilled. Use #4 chronic asthma good control plan flu shot. Diet exercise discussed medications refilled. Recheck in 6 months [facility]

## 2015-02-28 ENCOUNTER — Encounter: Payer: Self-pay | Admitting: Obstetrics and Gynecology

## 2015-03-16 DIAGNOSIS — Z96611 Presence of right artificial shoulder joint: Secondary | ICD-10-CM | POA: Diagnosis not present

## 2015-03-16 DIAGNOSIS — Z471 Aftercare following joint replacement surgery: Secondary | ICD-10-CM | POA: Diagnosis not present

## 2015-05-10 ENCOUNTER — Ambulatory Visit (INDEPENDENT_AMBULATORY_CARE_PROVIDER_SITE_OTHER): Payer: Medicare Other | Admitting: Family Medicine

## 2015-05-10 ENCOUNTER — Encounter: Payer: Self-pay | Admitting: Family Medicine

## 2015-05-10 VITALS — BP 122/80 | Temp 98.2°F | Ht 64.0 in | Wt 148.0 lb

## 2015-05-10 DIAGNOSIS — J209 Acute bronchitis, unspecified: Secondary | ICD-10-CM | POA: Diagnosis not present

## 2015-05-10 DIAGNOSIS — J019 Acute sinusitis, unspecified: Secondary | ICD-10-CM

## 2015-05-10 DIAGNOSIS — B9689 Other specified bacterial agents as the cause of diseases classified elsewhere: Secondary | ICD-10-CM

## 2015-05-10 MED ORDER — ALBUTEROL SULFATE HFA 108 (90 BASE) MCG/ACT IN AERS: 2.0000 | INHALATION_SPRAY | Freq: Four times a day (QID) | RESPIRATORY_TRACT | Status: DC | PRN

## 2015-05-10 MED ORDER — LEVOFLOXACIN 500 MG PO TABS: 500.0000 mg | ORAL_TABLET | Freq: Every day | ORAL | Status: DC

## 2015-05-10 NOTE — Progress Notes (Signed)
   Subjective:    Patient ID: Breanna Mason, female    DOB: 05/25/1945, 70 y.o.   MRN: [id number]  Cough This is a new problem. Episode onset: 2 weeks ago. Associated symptoms include nasal congestion, rhinorrhea and wheezing. Pertinent negatives include no chest pain, ear pain, fever or shortness of breath. Treatments tried: mucinex, tylenol. The treatment provided no relief.    patient has had these symptoms over the past couple weeks progressive over the past several days she is been physically and mentally stressed by her husbands recent illness and having knee replacement   Review of Systems  Constitutional: Negative for fever and activity change.  HENT: Positive for congestion and rhinorrhea. Negative for ear pain.   Eyes: Negative for discharge.  Respiratory: Positive for cough and wheezing. Negative for shortness of breath.   Cardiovascular: Negative for chest pain.       Objective:   Physical Exam  Constitutional: She appears well-developed.  HENT:  Head: Normocephalic.  Nose: Nose normal.  Mouth/Throat: Oropharynx is clear and moist. No oropharyngeal exudate.  Neck: Neck supple.  Cardiovascular: Normal rate and normal heart sounds.   No murmur heard. Pulmonary/Chest: Effort normal and breath sounds normal. She has no wheezes.  Lymphadenopathy:    She has no cervical adenopathy.  Skin: Skin is warm and dry.  Nursing note and vitals reviewed.         Assessment & Plan:  Patient was seen today for upper respiratory illness. It is felt that the patient is dealing with sinusitis. Antibiotics were prescribed today. Importance of compliance with medication was discussed. Symptoms should gradually resolve over the course of the next several days. If high fevers, progressive illness, difficulty breathing, worsening condition or failure for symptoms to improve over the next several days then the patient is to follow-up. If any emergent conditions the patient is to  follow-up in the emergency department otherwise to follow-up in the office.    patient also with bronchitis probably related to the viruses well   not respiratory distress I don't recommend any type of x-rays or lab work currently.

## 2015-05-12 ENCOUNTER — Other Ambulatory Visit: Payer: Self-pay | Admitting: *Deleted

## 2015-05-12 ENCOUNTER — Telehealth: Payer: Self-pay | Admitting: Family Medicine

## 2015-05-12 MED ORDER — SULFAMETHOXAZOLE-TRIMETHOPRIM 800-160 MG PO TABS: 1.0000 | ORAL_TABLET | Freq: Two times a day (BID) | ORAL | Status: DC

## 2015-05-12 NOTE — Telephone Encounter (Signed)
#  1 discontinue Levaquin No. 2 put Levaquin on her allergy list reason-nausea and vomiting #3 send in Bactrim DS 1 twice a day for 10 days if any problems with this medicine please notify

## 2015-05-12 NOTE — Telephone Encounter (Signed)
disucssed with pt. New med sent to pharm. levaquin added to allergy list

## 2015-05-12 NOTE — Telephone Encounter (Signed)
Pt called stating that the antibiotic that she was prescribed is making her sick and would like to know if something not as strong can be called in for her.   [facility] [place]

## 2015-06-08 IMAGING — MG MM DIGITAL SCREENING
5 series · 5 of 5 positions shown · non-contrast
Comparison: Previous exam(s).

CLINICAL DATA: Screening.

DIGITAL SCREENING BILATERAL MAMMOGRAM WITH CAD

[L CC (1 of 2)]
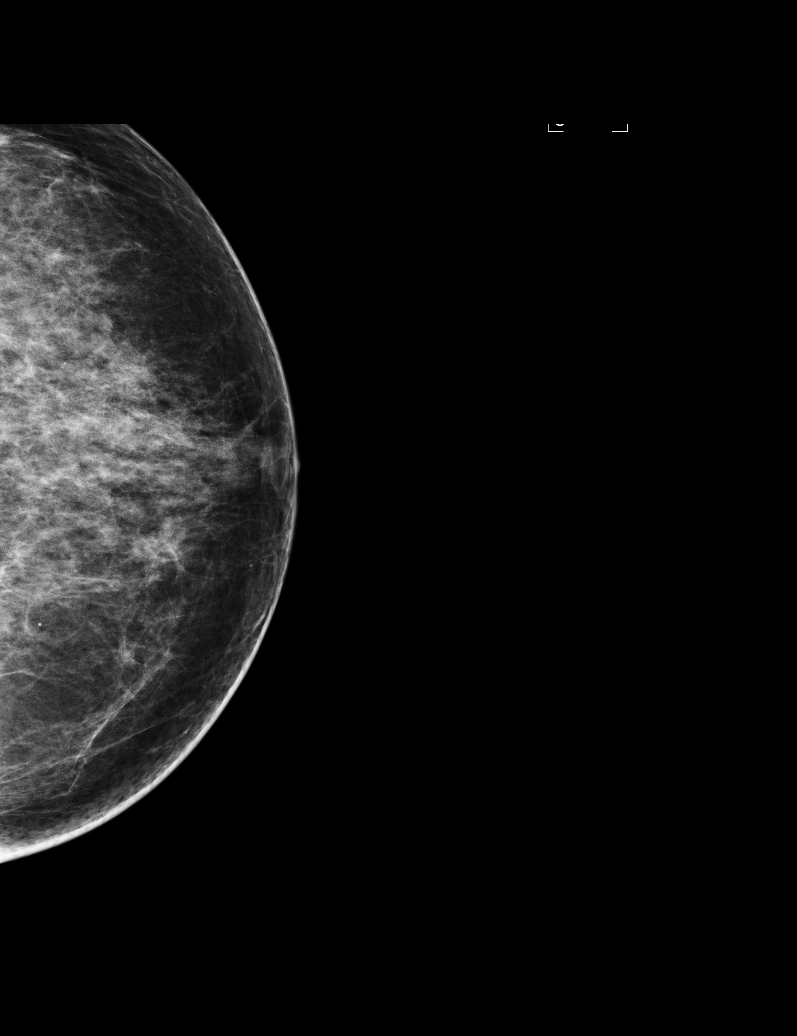

[L MLO]
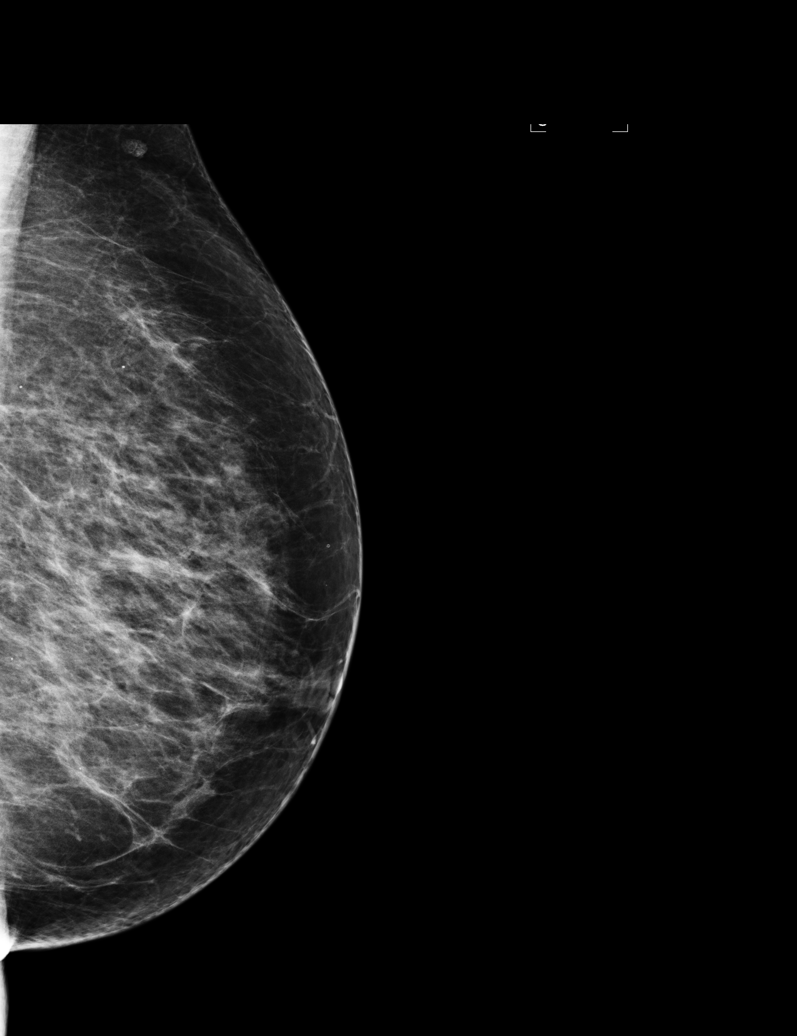

[R CC]
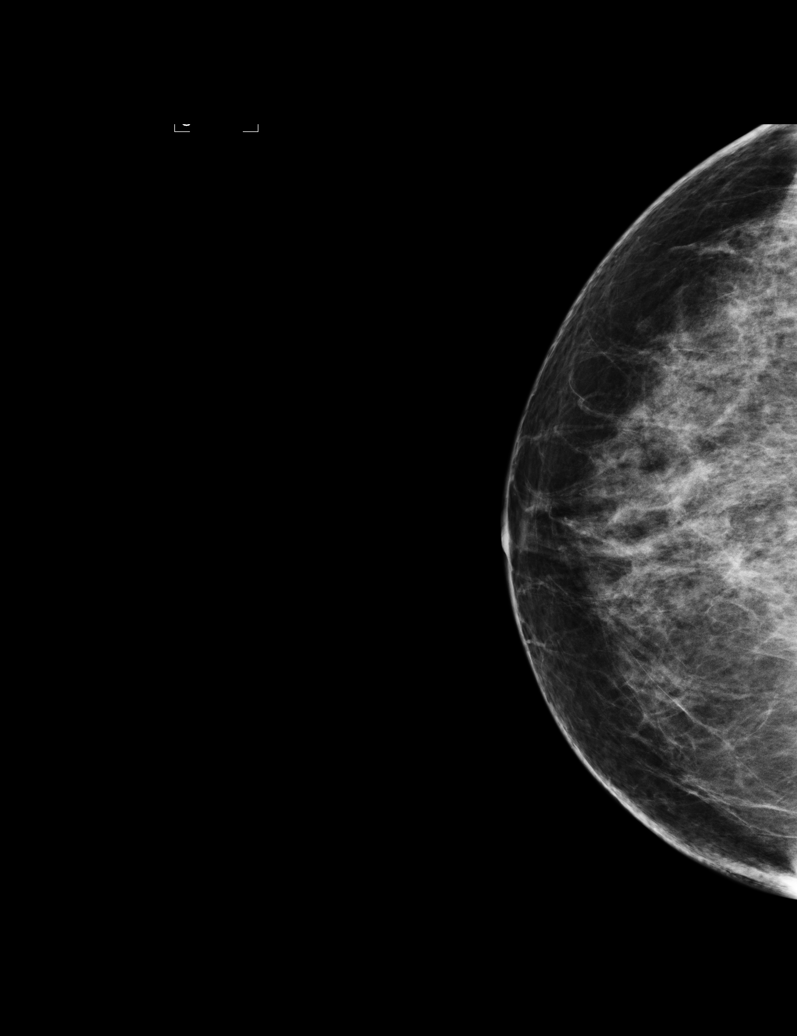

[R MLO]
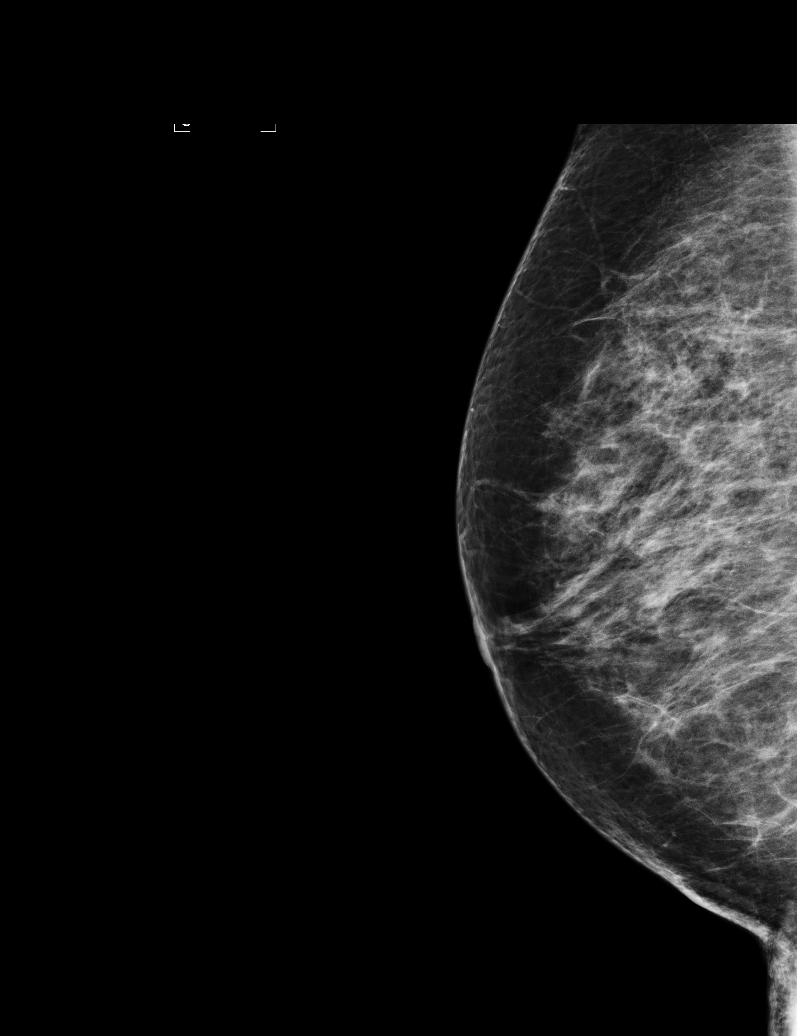

[L CC (2 of 2)]
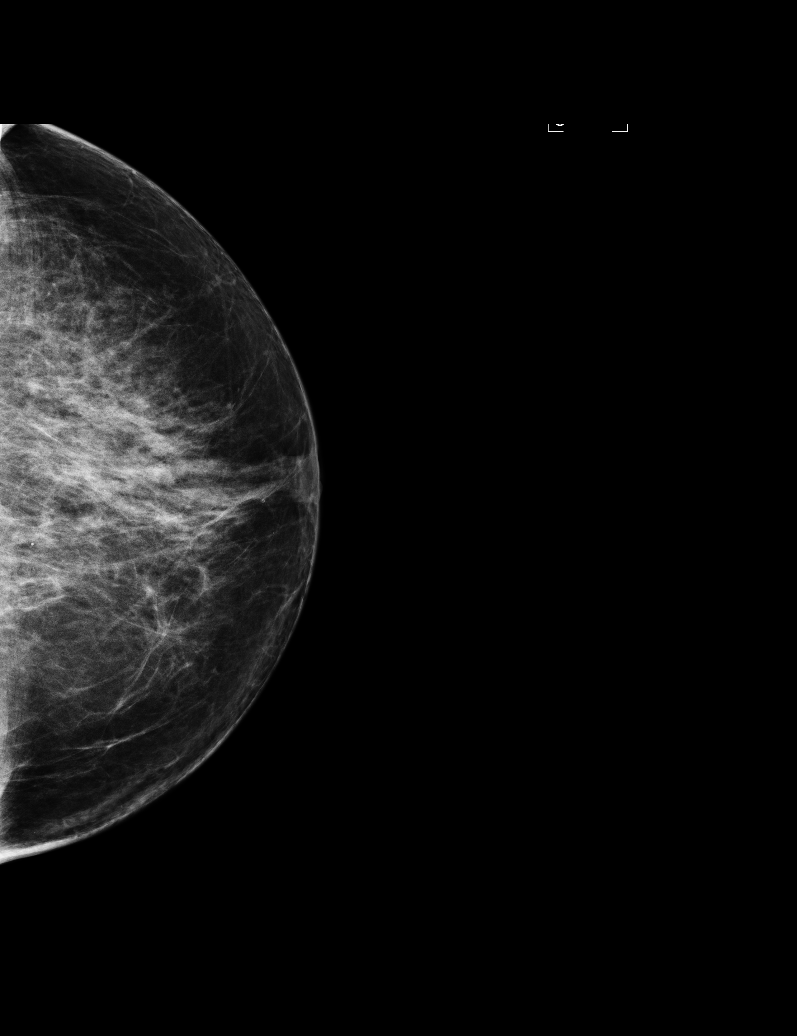

[5 of 5 positions shown; findings below may reference images not displayed]

FINDINGS: ACR Breast Density Category b:  There are scattered areas of
fibroglandular density.

There are no findings suspicious for malignancy.

Images were processed with CAD.
IMPRESSION: No mammographic evidence of malignancy.

A result letter of this screening mammogram will be mailed directly
to the patient.

RECOMMENDATION:
Screening mammogram in one year. (Code:[id number]-[id number])

BI-RADS CATEGORY 1:  Negative.

## 2015-07-07 ENCOUNTER — Telehealth: Payer: Self-pay | Admitting: Obstetrics and Gynecology

## 2015-07-07 NOTE — Telephone Encounter (Signed)
Pt unavailable.  I have been holding on to breast u/s since oct.  Pt will have f/u u/s in April.

## 2015-07-14 ENCOUNTER — Telehealth: Payer: Self-pay | Admitting: Family Medicine

## 2015-07-14 DIAGNOSIS — E785 Hyperlipidemia, unspecified: Secondary | ICD-10-CM

## 2015-07-14 DIAGNOSIS — I10 Essential (primary) hypertension: Secondary | ICD-10-CM

## 2015-07-14 DIAGNOSIS — R739 Hyperglycemia, unspecified: Secondary | ICD-10-CM

## 2015-07-14 NOTE — Telephone Encounter (Signed)
Lipid, liver, hemoglobin [id number], metabolic 7-hypertension, hyperlipidemia, hyperglycemia

## 2015-07-14 NOTE — Telephone Encounter (Signed)
bw orders for appt in April  Last chronic labs 02/18/15  BMP, Hep, Lip

## 2015-07-14 NOTE — Telephone Encounter (Signed)
Dr. Steves patient 

## 2015-07-15 NOTE — Telephone Encounter (Signed)
Spoke with patient and informed her per [place] for upcoming appointment have been ordered. Needs to be fasting prior to getting labs drawn. Patient verbalized understanding.

## 2015-08-08 ENCOUNTER — Telehealth: Payer: Self-pay | Admitting: Obstetrics and Gynecology

## 2015-08-08 ENCOUNTER — Other Ambulatory Visit: Payer: Self-pay | Admitting: Obstetrics and Gynecology

## 2015-08-08 DIAGNOSIS — Z09 Encounter for follow-up examination after completed treatment for conditions other than malignant neoplasm: Secondary | ICD-10-CM

## 2015-08-08 NOTE — Telephone Encounter (Signed)
Pt aware of mammogram appointment.

## 2015-08-08 NOTE — Telephone Encounter (Signed)
Please determine if this is screening, or diagnostic. If screening, she can schedule. If diagnostic, usually the radiology center forwards a request for the order.

## 2015-08-15 ENCOUNTER — Telehealth: Payer: Self-pay | Admitting: *Deleted

## 2015-08-15 NOTE — Telephone Encounter (Signed)
Orders already in EPIC from 08/08/15. Dr.Ferguson signed order request and it was faxed back to Radiology.

## 2015-08-16 ENCOUNTER — Ambulatory Visit (HOSPITAL_COMMUNITY)
Admission: RE | Admit: 2015-08-16 | Discharge: 2015-08-16 | Disposition: A | Discharge status: Home / Self Care | Payer: Medicare Other | Source: Ambulatory Visit | Attending: Obstetrics and Gynecology | Admitting: Obstetrics and Gynecology

## 2015-08-16 ENCOUNTER — Other Ambulatory Visit (HOSPITAL_COMMUNITY): Payer: Medicare Other

## 2015-08-16 DIAGNOSIS — N63 Unspecified lump in breast: Secondary | ICD-10-CM | POA: Insufficient documentation

## 2015-08-16 DIAGNOSIS — N6011 Diffuse cystic mastopathy of right breast: Secondary | ICD-10-CM | POA: Diagnosis not present

## 2015-08-16 DIAGNOSIS — Z09 Encounter for follow-up examination after completed treatment for conditions other than malignant neoplasm: Secondary | ICD-10-CM

## 2015-08-16 DIAGNOSIS — R928 Other abnormal and inconclusive findings on diagnostic imaging of breast: Secondary | ICD-10-CM | POA: Diagnosis not present

## 2015-08-23 DIAGNOSIS — I10 Essential (primary) hypertension: Secondary | ICD-10-CM | POA: Diagnosis not present

## 2015-08-23 DIAGNOSIS — R739 Hyperglycemia, unspecified: Secondary | ICD-10-CM | POA: Diagnosis not present

## 2015-08-23 DIAGNOSIS — E785 Hyperlipidemia, unspecified: Secondary | ICD-10-CM | POA: Diagnosis not present

## 2015-08-24 LAB — HEPATIC FUNCTION PANEL
ALBUMIN: 4.5 g/dL (ref 3.6–4.8)
ALT: 18 IU/L (ref 0–32)
AST: 20 IU/L (ref 0–40)
Alkaline Phosphatase: 42 IU/L (ref 39–117)
BILIRUBIN, DIRECT: 0.11 mg/dL (ref 0.00–0.40)
Bilirubin Total: 0.3 mg/dL (ref 0.0–1.2)
TOTAL PROTEIN: 6.7 g/dL (ref 6.0–8.5)

## 2015-08-24 LAB — LIPID PANEL
Chol/HDL Ratio: 3.9 ratio units (ref 0.0–4.4)
Cholesterol, Total: 212 mg/dL — ABNORMAL HIGH (ref 100–199)
HDL: 54 mg/dL (ref >39)
LDL Calculated: 140 mg/dL — ABNORMAL HIGH (ref 0–99)
Triglycerides: 92 mg/dL (ref 0–149)
VLDL Cholesterol Cal: 18 mg/dL (ref 5–40)

## 2015-08-24 LAB — BASIC METABOLIC PANEL
BUN/Creatinine Ratio: 22 (ref 12–28)
BUN: 15 mg/dL (ref 8–27)
CALCIUM: 9.1 mg/dL (ref 8.7–10.3)
CHLORIDE: 103 mmol/L (ref 96–106)
CO2: 20 mmol/L (ref 18–29)
Creatinine, Ser: 0.69 mg/dL (ref 0.57–1.00)
GFR, EST AFRICAN AMERICAN: 103 mL/min/{1.73_m2} (ref >59)
GFR, EST NON AFRICAN AMERICAN: 90 mL/min/{1.73_m2} (ref >59)
Glucose: 122 mg/dL — ABNORMAL HIGH (ref 65–99)
Potassium: 4.5 mmol/L (ref 3.5–5.2)
Sodium: 142 mmol/L (ref 134–144)

## 2015-08-24 LAB — HEMOGLOBIN A1C
Est. average glucose Bld gHb Est-mCnc: 123 mg/dL
Hgb A1c MFr Bld: 5.9 % — ABNORMAL HIGH (ref 4.8–5.6)

## 2015-08-26 ENCOUNTER — Ambulatory Visit: Payer: Medicare Other | Admitting: Family Medicine

## 2015-08-31 ENCOUNTER — Encounter: Payer: Self-pay | Admitting: Family Medicine

## 2015-08-31 ENCOUNTER — Ambulatory Visit (INDEPENDENT_AMBULATORY_CARE_PROVIDER_SITE_OTHER): Payer: Medicare Other | Admitting: Family Medicine

## 2015-08-31 VITALS — BP 124/80 | Ht 63.0 in | Wt 155.1 lb

## 2015-08-31 DIAGNOSIS — I10 Essential (primary) hypertension: Secondary | ICD-10-CM

## 2015-08-31 DIAGNOSIS — E785 Hyperlipidemia, unspecified: Secondary | ICD-10-CM | POA: Diagnosis not present

## 2015-08-31 DIAGNOSIS — J4520 Mild intermittent asthma, uncomplicated: Secondary | ICD-10-CM | POA: Diagnosis not present

## 2015-08-31 DIAGNOSIS — R7301 Impaired fasting glucose: Secondary | ICD-10-CM

## 2015-08-31 MED ORDER — ENALAPRIL MALEATE 10 MG PO TABS: 10.0000 mg | ORAL_TABLET | Freq: Every day | ORAL | Status: DC

## 2015-08-31 MED ORDER — PRAVASTATIN SODIUM 40 MG PO TABS: 40.0000 mg | ORAL_TABLET | Freq: Every day | ORAL | Status: DC

## 2015-08-31 MED ORDER — ALENDRONATE SODIUM 70 MG PO TABS: ORAL_TABLET | ORAL | Status: DC

## 2015-08-31 NOTE — Progress Notes (Signed)
   Subjective:    Patient ID: Breanna Mason, female    DOB: [birth year], 70 y.o.   MRN: [id number]  patient arrives office with numerous concerns Hyperlipidemia This is a chronic problem. The current episode started more than 1 year ago. There are no compliance problems.    Results for orders placed or performed in visit on 07/14/15  Lipid panel  Result Value Ref Range   Cholesterol, Total 212 (H) 100 - 199 mg/dL   Triglycerides 92 0 - 149 mg/dL   HDL 54 >39 mg/dL   VLDL Cholesterol Cal 18 5 - 40 mg/dL   LDL Calculated 140 (H) 0 - 99 mg/dL   Chol/HDL Ratio 3.9 0.0 - 4.4 ratio units  Hepatic function panel  Result Value Ref Range   Total Protein 6.7 6.0 - 8.5 g/dL   Albumin 4.5 3.6 - 4.8 g/dL   Bilirubin Total 0.3 0.0 - 1.2 mg/dL   Bilirubin, Direct 0.11 0.00 - 0.40 mg/dL   Alkaline Phosphatase 42 39 - 117 IU/L   AST 20 0 - 40 IU/L   ALT 18 0 - 32 IU/L  Hemoglobin A1C  Result Value Ref Range   Hgb A1c MFr Bld 5.9 (H) 4.8 - 5.6 %   Est. average glucose Bld gHb Est-mCnc 123 mg/dL  Basic metabolic panel  Result Value Ref Range   Glucose 122 (H) 65 - 99 mg/dL   BUN 15 8 - 27 mg/dL   Creatinine, Ser 0.69 0.57 - 1.00 mg/dL   GFR calc non Af Amer 90 >59 mL/min/1.73   GFR calc Af Amer 103 >59 mL/min/1.73   BUN/Creatinine Ratio 22 12 - 28   Sodium 142 134 - 144 mmol/L   Potassium 4.5 3.5 - 5.2 mmol/L   Chloride 103 96 - 106 mmol/L   CO2 20 18 - 29 mmol/L   Calcium 9.1 8.7 - 10.3 mg/dL   Overall breathing is stable rare use of rescue inhaler   Bp generally goo when ck'ed elsewhere compliant with blood pressure medicine watching salt intake no obvious side effects    chronic Symbicort use steady. No obvious side effects. Deftly helping wheezing. Rare use of albuterol  Lipid med compliant, recently has not followed diet very well, compliant with lipid medicine. No obvious side effects.   Patient states no other concerns this visit.  Review of Systems No headache, no major  weight loss or weight gain, no chest pain no back pain abdominal pain no change in bowel habits complete ROS otherwise negative     Objective:   Physical Exam  alert vitals stable HEENT normal lungs no wheezes today heart rare rhythm ankles without edema blood pressure excellent on repeat       Assessment & Plan:   impression hypertension good control discussed maintain same meds #2 hyperlipidemia suboptimal control discussed patient admits need to work harder on diet #3 asthma excellent control #4 insomnia ongoing need for meds #5 impaired fasting glucose still an issue encouraged strongly to work on things plan diet exercise discussed maintain same meds meds refilled [facility]

## 2015-09-15 ENCOUNTER — Other Ambulatory Visit: Payer: Self-pay | Admitting: Family Medicine

## 2015-09-15 NOTE — Telephone Encounter (Signed)
Ok six mo worth 

## 2015-10-06 DIAGNOSIS — L820 Inflamed seborrheic keratosis: Secondary | ICD-10-CM | POA: Diagnosis not present

## 2015-10-06 DIAGNOSIS — D225 Melanocytic nevi of trunk: Secondary | ICD-10-CM | POA: Diagnosis not present

## 2015-10-27 ENCOUNTER — Other Ambulatory Visit: Payer: Medicare Other | Admitting: Obstetrics and Gynecology

## 2015-12-14 ENCOUNTER — Telehealth: Payer: Self-pay | Admitting: Family Medicine

## 2015-12-14 DIAGNOSIS — Z79899 Other long term (current) drug therapy: Secondary | ICD-10-CM

## 2015-12-14 DIAGNOSIS — E785 Hyperlipidemia, unspecified: Secondary | ICD-10-CM

## 2015-12-14 NOTE — Telephone Encounter (Signed)
bw orders please for appt in Oct   Last labs 08/23/15  BMP, Hep, Lip, A1C

## 2015-12-15 NOTE — Telephone Encounter (Signed)
Lip liv met 7

## 2015-12-15 NOTE — Telephone Encounter (Signed)
Orders ready. Pt notified on voicemail.  

## 2015-12-21 ENCOUNTER — Other Ambulatory Visit: Payer: Self-pay | Admitting: Obstetrics and Gynecology

## 2015-12-21 DIAGNOSIS — N63 Unspecified lump in unspecified breast: Secondary | ICD-10-CM

## 2016-01-12 DIAGNOSIS — H31001 Unspecified chorioretinal scars, right eye: Secondary | ICD-10-CM | POA: Diagnosis not present

## 2016-01-20 DIAGNOSIS — Z23 Encounter for immunization: Secondary | ICD-10-CM | POA: Diagnosis not present

## 2016-01-25 ENCOUNTER — Other Ambulatory Visit: Payer: Self-pay | Admitting: *Deleted

## 2016-01-25 DIAGNOSIS — N63 Unspecified lump in unspecified breast: Secondary | ICD-10-CM

## 2016-01-26 ENCOUNTER — Telehealth: Payer: Self-pay | Admitting: Obstetrics and Gynecology

## 2016-01-26 NOTE — Telephone Encounter (Signed)
Spoke with [facility] and they are going to fax order for signature since Dr.Ferguson is out of the office.

## 2016-01-27 ENCOUNTER — Other Ambulatory Visit: Payer: Self-pay | Admitting: Adult Health

## 2016-01-27 DIAGNOSIS — N63 Unspecified lump in unspecified breast: Secondary | ICD-10-CM

## 2016-01-27 DIAGNOSIS — H353131 Nonexudative age-related macular degeneration, bilateral, early dry stage: Secondary | ICD-10-CM | POA: Diagnosis not present

## 2016-01-27 DIAGNOSIS — H2513 Age-related nuclear cataract, bilateral: Secondary | ICD-10-CM | POA: Diagnosis not present

## 2016-01-27 DIAGNOSIS — H04123 Dry eye syndrome of bilateral lacrimal glands: Secondary | ICD-10-CM | POA: Diagnosis not present

## 2016-01-31 ENCOUNTER — Ambulatory Visit (HOSPITAL_COMMUNITY)
Admission: RE | Admit: 2016-01-31 | Discharge: 2016-01-31 | Disposition: A | Discharge status: Home / Self Care | Payer: Medicare Other | Source: Ambulatory Visit | Attending: Adult Health | Admitting: Adult Health

## 2016-01-31 ENCOUNTER — Encounter (HOSPITAL_COMMUNITY): Payer: 59

## 2016-01-31 DIAGNOSIS — N63 Unspecified lump in unspecified breast: Secondary | ICD-10-CM

## 2016-01-31 DIAGNOSIS — R928 Other abnormal and inconclusive findings on diagnostic imaging of breast: Secondary | ICD-10-CM | POA: Diagnosis not present

## 2016-01-31 DIAGNOSIS — N6489 Other specified disorders of breast: Secondary | ICD-10-CM | POA: Diagnosis not present

## 2016-02-29 ENCOUNTER — Ambulatory Visit: Payer: Medicare Other | Admitting: Family Medicine

## 2016-02-29 DIAGNOSIS — E785 Hyperlipidemia, unspecified: Secondary | ICD-10-CM | POA: Diagnosis not present

## 2016-02-29 DIAGNOSIS — Z79899 Other long term (current) drug therapy: Secondary | ICD-10-CM | POA: Diagnosis not present

## 2016-03-01 ENCOUNTER — Ambulatory Visit: Payer: Medicare Other | Admitting: Family Medicine

## 2016-03-01 ENCOUNTER — Encounter: Payer: Self-pay | Admitting: Family Medicine

## 2016-03-01 LAB — LIPID PANEL
CHOL/HDL RATIO: 3.4 ratio (ref 0.0–4.4)
CHOLESTEROL TOTAL: 212 mg/dL — AB (ref 100–199)
HDL: 62 mg/dL (ref >39)
LDL CALC: 129 mg/dL — AB (ref 0–99)
Triglycerides: 106 mg/dL (ref 0–149)
VLDL CHOLESTEROL CAL: 21 mg/dL (ref 5–40)

## 2016-03-01 LAB — BASIC METABOLIC PANEL
BUN/Creatinine Ratio: 31 — ABNORMAL HIGH (ref 12–28)
BUN: 20 mg/dL (ref 8–27)
CALCIUM: 9.6 mg/dL (ref 8.7–10.3)
CHLORIDE: 103 mmol/L (ref 96–106)
CO2: 27 mmol/L (ref 18–29)
Creatinine, Ser: 0.65 mg/dL (ref 0.57–1.00)
GFR, EST AFRICAN AMERICAN: 105 mL/min/{1.73_m2} (ref >59)
GFR, EST NON AFRICAN AMERICAN: 91 mL/min/{1.73_m2} (ref >59)
Glucose: 107 mg/dL — ABNORMAL HIGH (ref 65–99)
POTASSIUM: 4.3 mmol/L (ref 3.5–5.2)
Sodium: 139 mmol/L (ref 134–144)

## 2016-03-01 LAB — HEPATIC FUNCTION PANEL
ALBUMIN: 4.7 g/dL (ref 3.6–4.8)
ALT: 27 IU/L (ref 0–32)
AST: 21 IU/L (ref 0–40)
Alkaline Phosphatase: 53 IU/L (ref 39–117)
BILIRUBIN TOTAL: 0.3 mg/dL (ref 0.0–1.2)
Bilirubin, Direct: <0.19 mg/dL (ref 0.00–0.40)
Total Protein: 7.4 g/dL (ref 6.0–8.5)

## 2016-03-01 NOTE — Telephone Encounter (Signed)
ERROR

## 2016-03-06 ENCOUNTER — Encounter: Payer: Self-pay | Admitting: Family Medicine

## 2016-03-06 ENCOUNTER — Ambulatory Visit (INDEPENDENT_AMBULATORY_CARE_PROVIDER_SITE_OTHER): Payer: Medicare Other | Admitting: Family Medicine

## 2016-03-06 VITALS — BP 112/80 | Ht 63.0 in | Wt 151.4 lb

## 2016-03-06 DIAGNOSIS — R7302 Impaired glucose tolerance (oral): Secondary | ICD-10-CM

## 2016-03-06 DIAGNOSIS — I10 Essential (primary) hypertension: Secondary | ICD-10-CM | POA: Diagnosis not present

## 2016-03-06 DIAGNOSIS — Z23 Encounter for immunization: Secondary | ICD-10-CM

## 2016-03-06 DIAGNOSIS — E7800 Pure hypercholesterolemia, unspecified: Secondary | ICD-10-CM

## 2016-03-06 DIAGNOSIS — J4540 Moderate persistent asthma, uncomplicated: Secondary | ICD-10-CM

## 2016-03-06 DIAGNOSIS — Z1159 Encounter for screening for other viral diseases: Secondary | ICD-10-CM

## 2016-03-06 MED ORDER — ENALAPRIL MALEATE 10 MG PO TABS: 10.0000 mg | ORAL_TABLET | Freq: Every day | ORAL | 1 refills | Status: DC

## 2016-03-06 MED ORDER — PRAVASTATIN SODIUM 40 MG PO TABS: 40.0000 mg | ORAL_TABLET | Freq: Every day | ORAL | 1 refills | Status: DC

## 2016-03-06 MED ORDER — ALPRAZOLAM 0.5 MG PO TABS: 0.5000 mg | ORAL_TABLET | Freq: Two times a day (BID) | ORAL | 5 refills | Status: DC | PRN

## 2016-03-06 NOTE — Progress Notes (Signed)
   Subjective:    Patient ID: Breanna Mason, female    DOB: October 29, 1945, 70 y.o.   MRN: [id number]  Hypertension  This is a chronic problem. The current episode started more than 1 year ago. The problem has been gradually improving since onset. There are no associated agents to hypertension. There are no known risk factors for coronary artery disease. Treatments tried: enalapril. The current treatment provides moderate improvement. There are no compliance problems.    Patient states that she is having trouble with hemorrhoids. Treatments tried: preparation H with no relief. On further history he recently the hemorrhoidal calm down. Patient wonders what to do about them the future today flareup and did not improve. Has had a colonoscopy within the last 3 years. No current bleeding. Has yearly rectal exam with wellness exam   Claims compliance with asthma medication. Reports definitely helps to wheezing. Rarely uses rescue inhaler.   Exercise ok walking when she can   Patient continues to take lipid medication regularly. No obvious side effects from it. Generally does not miss a dose. Prior blood work results are reviewed with patient. Patient continues to work on fat intake in diet  Results for orders placed or performed in visit on 12/14/15  Lipid panel  Result Value Ref Range   Cholesterol, Total 212 (H) 100 - 199 mg/dL   Triglycerides 106 0 - 149 mg/dL   HDL 62 >39 mg/dL   VLDL Cholesterol Cal 21 5 - 40 mg/dL   LDL Calculated 129 (H) 0 - 99 mg/dL   Chol/HDL Ratio 3.4 0.0 - 4.4 ratio units  Hepatic function panel  Result Value Ref Range   Total Protein 7.4 6.0 - 8.5 g/dL   Albumin 4.7 3.6 - 4.8 g/dL   Bilirubin Total 0.3 0.0 - 1.2 mg/dL   Bilirubin, Direct <0.19 0.00 - 0.40 mg/dL   Alkaline Phosphatase 53 39 - 117 IU/L   AST 21 0 - 40 IU/L   ALT 27 0 - 32 IU/L  Basic metabolic panel  Result Value Ref Range   Glucose 107 (H) 65 - 99 mg/dL   BUN 20 8 - 27 mg/dL   Creatinine,  Ser 0.65 0.57 - 1.00 mg/dL   GFR calc non Af Amer 91 >59 mL/min/1.73   GFR calc Af Amer 105 >59 mL/min/1.73   BUN/Creatinine Ratio 31 (H) 12 - 28   Sodium 139 134 - 144 mmol/L   Potassium 4.3 3.5 - 5.2 mmol/L   Chloride 103 96 - 106 mmol/L   CO2 27 18 - 29 mmol/L   Calcium 9.6 8.7 - 10.3 mg/dL   Breathing overall good   Review of Systems No headache, no major weight loss or weight gain, no chest pain no back pain abdominal pain no change in bowel habits complete ROS otherwise negative     Objective:   Physical Exam  Alert vitals stable, NAD. Blood pressure good on repeat. HEENT normal. Lungs clear. Heart regular rate and rhythm.       Assessment & Plan:  Impression hypertension good control discussed maintain same meds #2 hyperlipidemia blood work reviewed discussed maintain same meds #3 asthma clinically stable discussed maintain same meds 4 chronic insomnia review systems Xanax discuss medications refilled plan hepatitis C antibody screening. Pneumovax 23 symptom care discussed, meds reviewed refilled, diet exercise discussed

## 2016-03-26 ENCOUNTER — Other Ambulatory Visit: Payer: Self-pay | Admitting: Family Medicine

## 2016-03-26 NOTE — Telephone Encounter (Signed)
Ok six mo 

## 2016-04-02 ENCOUNTER — Other Ambulatory Visit: Payer: Self-pay | Admitting: Family Medicine

## 2016-04-03 MED ORDER — MECLIZINE HCL 25 MG PO TABS: 25.0000 mg | ORAL_TABLET | Freq: Two times a day (BID) | ORAL | 2 refills | Status: DC | PRN

## 2016-04-03 NOTE — Telephone Encounter (Signed)
This is Dr. Richardson Landry

## 2016-04-12 ENCOUNTER — Ambulatory Visit (INDEPENDENT_AMBULATORY_CARE_PROVIDER_SITE_OTHER): Payer: Medicare Other | Admitting: Family Medicine

## 2016-04-12 ENCOUNTER — Encounter: Payer: Self-pay | Admitting: Family Medicine

## 2016-04-12 VITALS — BP 100/72 | Temp 98.1°F | Ht 63.0 in | Wt 150.2 lb

## 2016-04-12 DIAGNOSIS — J019 Acute sinusitis, unspecified: Secondary | ICD-10-CM | POA: Diagnosis not present

## 2016-04-12 DIAGNOSIS — J4540 Moderate persistent asthma, uncomplicated: Secondary | ICD-10-CM

## 2016-04-12 DIAGNOSIS — J209 Acute bronchitis, unspecified: Secondary | ICD-10-CM | POA: Diagnosis not present

## 2016-04-12 DIAGNOSIS — B9689 Other specified bacterial agents as the cause of diseases classified elsewhere: Secondary | ICD-10-CM | POA: Diagnosis not present

## 2016-04-12 MED ORDER — ALBUTEROL SULFATE HFA 108 (90 BASE) MCG/ACT IN AERS: 2.0000 | INHALATION_SPRAY | Freq: Four times a day (QID) | RESPIRATORY_TRACT | 2 refills | Status: DC | PRN

## 2016-04-12 MED ORDER — CEFPROZIL 500 MG PO TABS
500.0000 mg | ORAL_TABLET | Freq: Two times a day (BID) | ORAL | 0 refills | Status: DC
Start: 2016-04-12 — End: 2016-09-03

## 2016-04-12 NOTE — Progress Notes (Signed)
   Subjective:    Patient ID: Breanna Mason, female    DOB: Jan 20, 1946, 70 y.o.   MRN: [id number]  Sinusitis  This is a new problem. The current episode started in the past 7 days. The problem is unchanged. There has been no fever. The pain is moderate. Associated symptoms include congestion, coughing, ear pain, headaches and a sore throat. Pertinent negatives include no shortness of breath. (Wheezing) Treatments tried: tylenol, delsym, aspirin. The treatment provided no relief.   Patient has no other concerns at this time.   Review of Systems  Constitutional: Negative for activity change and fever.  HENT: Positive for congestion, ear pain, rhinorrhea and sore throat.   Eyes: Negative for discharge.  Respiratory: Positive for cough and wheezing. Negative for shortness of breath.   Cardiovascular: Negative for chest pain.  Neurological: Positive for headaches.       Objective:   Physical Exam  Constitutional: She appears well-developed.  HENT:  Head: Normocephalic.  Nose: Nose normal.  Mouth/Throat: Oropharynx is clear and moist. No oropharyngeal exudate.  Neck: Neck supple.  Cardiovascular: Normal rate and normal heart sounds.   No murmur heard. Pulmonary/Chest: Effort normal and breath sounds normal. She has no wheezes.  Lymphadenopathy:    She has no cervical adenopathy.  Skin: Skin is warm and dry.  Nursing note and vitals reviewed.    Patient does not appear toxic not respiratory distress     Assessment & Plan:  Viral syndrome Secondary rhinosinusitis Asthma stable no flareup

## 2016-04-12 NOTE — Patient Instructions (Signed)
You should gradually get better over the course of the next 10 days if any problems please let us know

## 2016-04-18 ENCOUNTER — Encounter (INDEPENDENT_AMBULATORY_CARE_PROVIDER_SITE_OTHER): Payer: Self-pay | Admitting: Internal Medicine

## 2016-04-18 ENCOUNTER — Ambulatory Visit (INDEPENDENT_AMBULATORY_CARE_PROVIDER_SITE_OTHER): Payer: Medicare Other | Admitting: Internal Medicine

## 2016-04-18 VITALS — BP 132/84 | HR 72 | Temp 98.2°F | Ht 63.0 in | Wt 151.2 lb

## 2016-04-18 DIAGNOSIS — K625 Hemorrhage of anus and rectum: Secondary | ICD-10-CM | POA: Diagnosis not present

## 2016-04-18 NOTE — Progress Notes (Signed)
Subjective:    Patient ID: Breanna Mason, female    DOB: 1945/06/19, 70 y.o.   MRN: [id number]  HPIPresents today with c/o rectal bleeding. She tells me she has a hemorrhoid. Hx of same and Dr. Marnette Burgess lanced years ago.  She tells me a few days ago, she had some rectal bleeding x 1. Describes as a very small amt. She thinks her hemorrhoids are bleeding. Has not seen any since.  There is no abdominal pain. No rectal pain. Stools are normal. Sometimes she has itching to her rectum. She denies constipation.        09/17/2013 Colonoscopy  Indications:  Patient is 70 year old Caucasian female who is here for screening colonoscopy. Her last exam was 10 years ago with removal of single small polyp which possibly was hypoplastic.  Impression:  Examination performed to cecum. Two 5 mm polyps were cold snared and submitted together(splenic flexure and sigmoid colon). Three diverticula at sigmoid colon. External hemorrhoids.  Patient had 2 small polyps removed and they're tubular adenomas. Results reviewed with patient. Next colonoscopy in 7 years.  Review of Systems Past Medical History:  Diagnosis Date  . Allergy   . Anxiety   . Arthritis   . Asthma   . Essential hypertension, benign   . GERD (gastroesophageal reflux disease)   . IFG (impaired fasting glucose)   . Impaired glucose tolerance   . Mixed hyperlipidemia   . Osteopenia   . Osteoporosis   . Pneumonia    hx  . PONV (postoperative nausea and vomiting)   . Reflux     Past Surgical History:  Procedure Laterality Date  . Bilateral tubal ligation    . CARPAL TUNNEL RELEASE Right    30 yrs  . CESAREAN SECTION    . COLONOSCOPY N/A 09/17/2013   Procedure: COLONOSCOPY;  Surgeon: Rogene Houston, MD;  Location: [facility];  Service: Endoscopy;  Laterality: N/A;  1200  . REVERSE SHOULDER ARTHROPLASTY Right 03/11/2014   Procedure: RIGHT REVERSE SHOULDER ARTHROPLASTY;  Surgeon: Marin Shutter, MD;  Location: [facility];  Service: Orthopedics;  Laterality: Right;  . SHOULDER SURGERY Right    x4  . TUBAL LIGATION      Allergies  Allergen Reactions  . Levaquin [Levofloxacin In D5w] Nausea And Vomiting  . Penicillins Other (See Comments)    " Makes me sick" can take cephalosporins    Current Outpatient Prescriptions on File Prior to Visit  Medication Sig Dispense Refill  . albuterol (PROVENTIL HFA;VENTOLIN HFA) 108 (90 Base) MCG/ACT inhaler Inhale 2 puffs into the lungs every 6 (six) hours as needed for wheezing. 1 Inhaler 2  . alendronate (FOSAMAX) 70 MG tablet TAKE ONE TABLET BY MOUTH ONCE A WEEK 12 tablet 1  . ALPRAZolam (XANAX) 0.5 MG tablet TAKE ONE TABLET BY MOUTH TWICE DAILY AS NEEDED FOR ANXIETY 30 tablet 5  . budesonide-formoterol (SYMBICORT) 160-4.5 MCG/ACT inhaler Inhale 1 puff into the lungs 2 (two) times daily. 3 Inhaler 1  . calcium-vitamin D (OSCAL WITH D) 500-200 MG-UNIT per tablet Take 1 tablet by mouth daily.      . cefPROZIL (CEFZIL) 500 MG tablet Take 1 tablet (500 mg total) by mouth 2 (two) times daily. 20 tablet 0  . docusate sodium (COLACE) 50 MG capsule Take 50 mg by mouth as needed for mild constipation. duralax    . enalapril (VASOTEC) 10 MG tablet Take 1 tablet (10 mg total) by mouth daily with breakfast. 90 tablet 1  .  meclizine (ANTIVERT) 25 MG tablet Take 1 tablet (25 mg total) by mouth 2 (two) times daily as needed for dizziness. 30 tablet 2  . pravastatin (PRAVACHOL) 40 MG tablet Take 1 tablet (40 mg total) by mouth at bedtime. 90 tablet 1   No current facility-administered medications on file prior to visit.        Objective:   Physical Exam Blood pressure 132/84, pulse 72, temperature 98.2 F (36.8 C), height 5\' 3"  (1.6 m), weight 151 lb 3.2 oz (68.6 kg). Alert and oriented. Skin warm and dry. Oral mucosa is moist.   . Sclera anicteric, conjunctivae is pink. Thyroid not enlarged. No cervical lymphadenopathy. Lungs clear. Heart regular rate and rhythm.  Abdomen is  soft. Bowel sounds are positive. No hepatomegaly. No abdominal masses felt. No tenderness.  No edema to lower extremities.   Stool brown and guaiac negative.      Assessment & Plan:  Rectal bleeding resolved. Probably hemorrhoidal.  CBC today.  Three stool cards home with patient.

## 2016-04-18 NOTE — Patient Instructions (Signed)
CBC Stool cards x 3

## 2016-04-19 LAB — CBC
HCT: 42.5 % (ref 35.0–45.0)
Hemoglobin: 14.1 g/dL (ref 11.7–15.5)
MCH: 30.7 pg (ref 27.0–33.0)
MCHC: 33.2 g/dL (ref 32.0–36.0)
MCV: 92.4 fL (ref 80.0–100.0)
MPV: 11 fL (ref 7.5–12.5)
PLATELETS: 304 10*3/uL (ref 140–400)
RBC: 4.6 MIL/uL (ref 3.80–5.10)
RDW: 13.6 % (ref 11.0–15.0)
WBC: 7.2 10*3/uL (ref 3.8–10.8)

## 2016-04-24 ENCOUNTER — Telehealth (INDEPENDENT_AMBULATORY_CARE_PROVIDER_SITE_OTHER): Payer: Self-pay | Admitting: *Deleted

## 2016-04-24 DIAGNOSIS — K625 Hemorrhage of anus and rectum: Secondary | ICD-10-CM | POA: Diagnosis not present

## 2016-04-24 NOTE — Telephone Encounter (Signed)
CBC in 8 weeks

## 2016-04-24 NOTE — Telephone Encounter (Signed)
   Diagnosis:    Result(s)   Card 1: Positive    Card 2: Negative:   Card 3: Negative:    Completed by: Thomas Hoff , LPN   HEMOCCULT SENSA DEVELOPER: [id number] Marguarite Arbour DATE: 2020-05   HEMOCCULT SENSA CARD:  [id number]   EXPIRATION DATE: 05/20   CARD CONTROL RESULTS:  POSITIVE: Positive  NEGATIVE: Negative    ADDITIONAL COMMENTS: Forwarded to [place] for review.

## 2016-04-24 NOTE — Telephone Encounter (Signed)
CBC in 8 weeks. Results given to patient

## 2016-04-25 ENCOUNTER — Other Ambulatory Visit (INDEPENDENT_AMBULATORY_CARE_PROVIDER_SITE_OTHER): Payer: Self-pay | Admitting: *Deleted

## 2016-04-25 DIAGNOSIS — K625 Hemorrhage of anus and rectum: Secondary | ICD-10-CM

## 2016-04-25 NOTE — Telephone Encounter (Signed)
CBC is noted for  February 14,2018. A letter will be sent as a reminder.

## 2016-04-28 ENCOUNTER — Other Ambulatory Visit: Payer: Self-pay | Admitting: Family Medicine

## 2016-05-02 ENCOUNTER — Other Ambulatory Visit: Payer: Self-pay | Admitting: Family Medicine

## 2016-05-16 ENCOUNTER — Encounter: Payer: Self-pay | Admitting: Family Medicine

## 2016-05-16 ENCOUNTER — Ambulatory Visit (INDEPENDENT_AMBULATORY_CARE_PROVIDER_SITE_OTHER): Payer: Medicare Other | Admitting: Family Medicine

## 2016-05-16 VITALS — BP 140/86 | Temp 98.1°F | Ht 63.0 in | Wt 155.6 lb

## 2016-05-16 DIAGNOSIS — J4540 Moderate persistent asthma, uncomplicated: Secondary | ICD-10-CM

## 2016-05-16 MED ORDER — CLARITHROMYCIN 500 MG PO TABS: 500.0000 mg | ORAL_TABLET | Freq: Two times a day (BID) | ORAL | 0 refills | Status: DC

## 2016-05-16 NOTE — Progress Notes (Signed)
   Subjective:    Patient ID: Breanna Mason, female    DOB: 1945/11/06, 71 y.o.   MRN: [id number]  Cough  This is a new problem. The current episode started in the past 7 days. Associated symptoms include ear pain, headaches, nasal congestion and wheezing. Treatments tried: tylenol.   Headache frontal in nature. Increase pressure. Worse with change of position Had flare last mo and took cefzil  Did not totally improve  Persistent cough and congestion. States now seems to be worse.  Review of Systems  HENT: Positive for ear pain.   Respiratory: Positive for cough and wheezing.   Neurological: Positive for headaches.       Objective:   Physical Exam  Alert vital stable HET moderate nasal congestion frontal tenderness pharynx normal lungs rare wheeze heart regular in rhythm      Assessment & Plan:  Impression sinusitis/bronchitis post viral with element of reactive airway flare

## 2016-05-31 ENCOUNTER — Other Ambulatory Visit (INDEPENDENT_AMBULATORY_CARE_PROVIDER_SITE_OTHER): Payer: Self-pay | Admitting: *Deleted

## 2016-05-31 ENCOUNTER — Encounter (INDEPENDENT_AMBULATORY_CARE_PROVIDER_SITE_OTHER): Payer: Self-pay | Admitting: *Deleted

## 2016-05-31 DIAGNOSIS — K625 Hemorrhage of anus and rectum: Secondary | ICD-10-CM

## 2016-06-14 DIAGNOSIS — G5761 Lesion of plantar nerve, right lower limb: Secondary | ICD-10-CM | POA: Diagnosis not present

## 2016-06-14 DIAGNOSIS — M79673 Pain in unspecified foot: Secondary | ICD-10-CM | POA: Diagnosis not present

## 2016-06-14 DIAGNOSIS — M7740 Metatarsalgia, unspecified foot: Secondary | ICD-10-CM | POA: Diagnosis not present

## 2016-06-20 DIAGNOSIS — K625 Hemorrhage of anus and rectum: Secondary | ICD-10-CM | POA: Diagnosis not present

## 2016-06-20 LAB — CBC
HEMATOCRIT: 43.7 % (ref 35.0–45.0)
HEMOGLOBIN: 14.5 g/dL (ref 11.7–15.5)
MCH: 31.3 pg (ref 27.0–33.0)
MCHC: 33.2 g/dL (ref 32.0–36.0)
MCV: 94.2 fL (ref 80.0–100.0)
MPV: 11.4 fL (ref 7.5–12.5)
Platelets: 264 10*3/uL (ref 140–400)
RBC: 4.64 MIL/uL (ref 3.80–5.10)
RDW: 13.9 % (ref 11.0–15.0)
WBC: 5.7 10*3/uL (ref 3.8–10.8)

## 2016-06-21 ENCOUNTER — Other Ambulatory Visit (INDEPENDENT_AMBULATORY_CARE_PROVIDER_SITE_OTHER): Payer: Self-pay | Admitting: *Deleted

## 2016-06-21 DIAGNOSIS — K625 Hemorrhage of anus and rectum: Secondary | ICD-10-CM

## 2016-07-30 ENCOUNTER — Encounter (INDEPENDENT_AMBULATORY_CARE_PROVIDER_SITE_OTHER): Payer: Self-pay | Admitting: *Deleted

## 2016-07-30 ENCOUNTER — Other Ambulatory Visit (INDEPENDENT_AMBULATORY_CARE_PROVIDER_SITE_OTHER): Payer: Self-pay | Admitting: *Deleted

## 2016-07-30 DIAGNOSIS — K625 Hemorrhage of anus and rectum: Secondary | ICD-10-CM

## 2016-08-13 ENCOUNTER — Telehealth: Payer: Self-pay | Admitting: Family Medicine

## 2016-08-13 DIAGNOSIS — I10 Essential (primary) hypertension: Secondary | ICD-10-CM

## 2016-08-13 DIAGNOSIS — R7302 Impaired glucose tolerance (oral): Secondary | ICD-10-CM

## 2016-08-13 DIAGNOSIS — E782 Mixed hyperlipidemia: Secondary | ICD-10-CM

## 2016-08-13 NOTE — Telephone Encounter (Signed)
Patient has appointment for 6 month followup on 4/30 and wanting labs done. She wants them to sent to [place] at [facility].

## 2016-08-13 NOTE — Telephone Encounter (Signed)
Blood work ordered in EPIC. Patient notified. 

## 2016-08-13 NOTE — Telephone Encounter (Signed)
Lip liv A1c 

## 2016-08-16 DIAGNOSIS — K625 Hemorrhage of anus and rectum: Secondary | ICD-10-CM | POA: Diagnosis not present

## 2016-08-16 LAB — CBC
HEMATOCRIT: 43.4 % (ref 35.0–45.0)
HEMOGLOBIN: 14.4 g/dL (ref 11.7–15.5)
MCH: 30.8 pg (ref 27.0–33.0)
MCHC: 33.2 g/dL (ref 32.0–36.0)
MCV: 92.9 fL (ref 80.0–100.0)
MPV: 11.2 fL (ref 7.5–12.5)
Platelets: 259 10*3/uL (ref 140–400)
RBC: 4.67 MIL/uL (ref 3.80–5.10)
RDW: 13.6 % (ref 11.0–15.0)
WBC: 5.1 10*3/uL (ref 3.8–10.8)

## 2016-08-20 ENCOUNTER — Other Ambulatory Visit (INDEPENDENT_AMBULATORY_CARE_PROVIDER_SITE_OTHER): Payer: Self-pay | Admitting: *Deleted

## 2016-08-20 DIAGNOSIS — K625 Hemorrhage of anus and rectum: Secondary | ICD-10-CM

## 2016-08-24 DIAGNOSIS — E782 Mixed hyperlipidemia: Secondary | ICD-10-CM | POA: Diagnosis not present

## 2016-08-24 DIAGNOSIS — I10 Essential (primary) hypertension: Secondary | ICD-10-CM | POA: Diagnosis not present

## 2016-08-24 DIAGNOSIS — R7302 Impaired glucose tolerance (oral): Secondary | ICD-10-CM | POA: Diagnosis not present

## 2016-08-25 LAB — HEMOGLOBIN A1C
ESTIMATED AVERAGE GLUCOSE: 126 mg/dL
Hgb A1c MFr Bld: 6 % — ABNORMAL HIGH (ref 4.8–5.6)

## 2016-08-25 LAB — HEPATIC FUNCTION PANEL
ALK PHOS: 53 IU/L (ref 39–117)
ALT: 22 IU/L (ref 0–32)
AST: 17 IU/L (ref 0–40)
Albumin: 4.4 g/dL (ref 3.6–4.8)
Bilirubin Total: 0.5 mg/dL (ref 0.0–1.2)
Bilirubin, Direct: 0.1 mg/dL (ref 0.00–0.40)
TOTAL PROTEIN: 7 g/dL (ref 6.0–8.5)

## 2016-08-25 LAB — LIPID PANEL
Chol/HDL Ratio: 4.3 ratio (ref 0.0–4.4)
Cholesterol, Total: 212 mg/dL — ABNORMAL HIGH (ref 100–199)
HDL: 49 mg/dL (ref >39)
LDL Calculated: 130 mg/dL — ABNORMAL HIGH (ref 0–99)
Triglycerides: 163 mg/dL — ABNORMAL HIGH (ref 0–149)
VLDL CHOLESTEROL CAL: 33 mg/dL (ref 5–40)

## 2016-09-03 ENCOUNTER — Encounter: Payer: Self-pay | Admitting: Family Medicine

## 2016-09-03 ENCOUNTER — Ambulatory Visit (INDEPENDENT_AMBULATORY_CARE_PROVIDER_SITE_OTHER): Payer: Medicare Other | Admitting: Family Medicine

## 2016-09-03 VITALS — BP 128/82 | Ht 63.0 in | Wt 155.0 lb

## 2016-09-03 DIAGNOSIS — E782 Mixed hyperlipidemia: Secondary | ICD-10-CM | POA: Diagnosis not present

## 2016-09-03 DIAGNOSIS — J4540 Moderate persistent asthma, uncomplicated: Secondary | ICD-10-CM

## 2016-09-03 DIAGNOSIS — I10 Essential (primary) hypertension: Secondary | ICD-10-CM

## 2016-09-03 MED ORDER — ALENDRONATE SODIUM 70 MG PO TABS: 70.0000 mg | ORAL_TABLET | ORAL | 1 refills | Status: DC

## 2016-09-03 MED ORDER — ENALAPRIL MALEATE 10 MG PO TABS: 10.0000 mg | ORAL_TABLET | Freq: Every day | ORAL | 1 refills | Status: DC

## 2016-09-03 MED ORDER — ALPRAZOLAM 0.5 MG PO TABS: 0.5000 mg | ORAL_TABLET | Freq: Two times a day (BID) | ORAL | 5 refills | Status: DC | PRN

## 2016-09-03 MED ORDER — PRAVASTATIN SODIUM 40 MG PO TABS: 40.0000 mg | ORAL_TABLET | Freq: Every day | ORAL | 1 refills | Status: DC

## 2016-09-03 MED ORDER — BUDESONIDE-FORMOTEROL FUMARATE 160-4.5 MCG/ACT IN AERO: 1.0000 | INHALATION_SPRAY | Freq: Two times a day (BID) | RESPIRATORY_TRACT | 5 refills | Status: DC

## 2016-09-03 NOTE — Progress Notes (Signed)
   Subjective:    Patient ID: Breanna Mason, female    DOB: March 22, 1946, 71 y.o.   MRN: [id number]  Hyperlipidemia  This is a chronic problem. The current episode started more than 1 year ago. Current antihyperlipidemic treatment includes statins (pravachol). There are no compliance problems.  Risk factors for coronary artery disease include dyslipidemia and post-menopausal.   Results for orders placed or performed in visit on 08/13/16  Lipid panel  Result Value Ref Range   Cholesterol, Total 212 (H) 100 - 199 mg/dL   Triglycerides 163 (H) 0 - 149 mg/dL   HDL 49 >39 mg/dL   VLDL Cholesterol Cal 33 5 - 40 mg/dL   LDL Calculated 130 (H) 0 - 99 mg/dL   Chol/HDL Ratio 4.3 0.0 - 4.4 ratio  Hepatic function panel  Result Value Ref Range   Total Protein 7.0 6.0 - 8.5 g/dL   Albumin 4.4 3.6 - 4.8 g/dL   Bilirubin Total 0.5 0.0 - 1.2 mg/dL   Bilirubin, Direct 0.10 0.00 - 0.40 mg/dL   Alkaline Phosphatase 53 39 - 117 IU/L   AST 17 0 - 40 IU/L   ALT 22 0 - 32 IU/L  Hemoglobin A1c  Result Value Ref Range   Hgb A1c MFr Bld 6.0 (H) 4.8 - 5.6 %   Est. average glucose Bld gHb Est-mCnc 126 mg/dL   Blood pressure medicine and blood pressure levels reviewed today with patient. Compliant with blood pressure medicine. States does not miss a dose. No obvious side effects. Blood pressure generally good when checked elsewhere. Watching salt intake.  Patient continues to take lipid medication regularly. No obvious side effects from it. Generally does not miss a dose. Prior blood work results are reviewed with patient. Patient continues to work on fat intake in diet  BP s overall good  .predi Patient has known prediabetes. Worried about a good strong family history diabetes. Admits to some dietary noncompliance of late.  Reports compliant with asthma medication. Overall helping wheezing. Rare use of rescue inhaler. No major side effect. Cost very high several $100 per month  Review of Systems No  headache, no major weight loss or weight gain, no chest pain no back pain abdominal pain no change in bowel habits complete ROS otherwise negative     Objective:   Physical Exam  Alert and oriented, vitals reviewed and stable, NAD ENT-TM's and ext canals WNL bilat via otoscopic exam Soft palate, tonsils and post pharynx WNL via oropharyngeal exam Neck-symmetric, no masses; thyroid nonpalpable and nontender Pulmonary-no tachypnea or accessory muscle use; Clear without wheezes via auscultation Card--no abnrml murmurs, rhythm reg and rate WNL Carotid pulses symmetric, without bruits      Assessment & Plan:  #1 hypertension discussed maintain same meds compliance discussed #2 prediabetes discuss A1c up a bit but still not too high #3 hyperlipidemia discussed including need for meds #4 asthma clinically stable importance of compliance discussed all meds refilled. Diet exercise discussed. Blood work reviewed. Recheck in 6 months

## 2016-10-15 DIAGNOSIS — L820 Inflamed seborrheic keratosis: Secondary | ICD-10-CM | POA: Diagnosis not present

## 2016-10-15 DIAGNOSIS — L570 Actinic keratosis: Secondary | ICD-10-CM | POA: Diagnosis not present

## 2016-10-15 DIAGNOSIS — X32XXXD Exposure to sunlight, subsequent encounter: Secondary | ICD-10-CM | POA: Diagnosis not present

## 2016-10-15 DIAGNOSIS — D225 Melanocytic nevi of trunk: Secondary | ICD-10-CM | POA: Diagnosis not present

## 2016-10-29 ENCOUNTER — Ambulatory Visit (INDEPENDENT_AMBULATORY_CARE_PROVIDER_SITE_OTHER): Payer: Medicare Other | Admitting: Obstetrics and Gynecology

## 2016-10-29 ENCOUNTER — Encounter: Payer: Self-pay | Admitting: Obstetrics and Gynecology

## 2016-10-29 ENCOUNTER — Other Ambulatory Visit (HOSPITAL_COMMUNITY)
Admission: RE | Admit: 2016-10-29 | Discharge: 2016-10-29 | Disposition: A | Discharge status: Home / Self Care | Payer: Medicare Other | Source: Ambulatory Visit | Attending: Obstetrics and Gynecology | Admitting: Obstetrics and Gynecology

## 2016-10-29 VITALS — BP 112/68 | HR 100 | Ht 63.0 in | Wt 157.2 lb

## 2016-10-29 DIAGNOSIS — Z124 Encounter for screening for malignant neoplasm of cervix: Secondary | ICD-10-CM | POA: Diagnosis not present

## 2016-10-29 DIAGNOSIS — Z1212 Encounter for screening for malignant neoplasm of rectum: Secondary | ICD-10-CM

## 2016-10-29 DIAGNOSIS — Z01419 Encounter for gynecological examination (general) (routine) without abnormal findings: Secondary | ICD-10-CM | POA: Diagnosis not present

## 2016-10-29 LAB — HEMOCCULT GUIAC POC 1CARD (OFFICE): Fecal Occult Blood, POC: NEGATIVE

## 2016-10-29 NOTE — Progress Notes (Signed)
Assessment:  Annual Gyn Exam   Plan:  1. pap smear done, next pap due 3 years 2. return annually or prn 3    Annual mammogram advised, scheduled next for September 2018 Subjective:  Breanna Mason is a 71 y.o. female No obstetric history on file. who presents for annual exam. No LMP recorded. Patient is postmenopausal. The patient has complaints today of hemorrhoid issues but states it was not bleeding this morning. She is followed by a dermatologist for actinic keratosis.   The following portions of the patient's history were reviewed and updated as appropriate: allergies, current medications, past family history, past medical history, past social history, past surgical history and problem list. Past Medical History:  Diagnosis Date  . Allergy   . Anxiety   . Arthritis   . Asthma   . Essential hypertension, benign   . GERD (gastroesophageal reflux disease)   . IFG (impaired fasting glucose)   . Impaired glucose tolerance   . Mixed hyperlipidemia   . Osteopenia   . Osteoporosis   . Pneumonia    hx  . PONV (postoperative nausea and vomiting)   . Reflux     Past Surgical History:  Procedure Laterality Date  . Bilateral tubal ligation    . CARPAL TUNNEL RELEASE Right    30 yrs  . CESAREAN SECTION    . COLONOSCOPY N/A 09/17/2013   Procedure: COLONOSCOPY;  Surgeon: Rogene Houston, MD;  Location: [facility];  Service: Endoscopy;  Laterality: N/A;  1200  . REVERSE SHOULDER ARTHROPLASTY Right 03/11/2014   Procedure: RIGHT REVERSE SHOULDER ARTHROPLASTY;  Surgeon: Marin Shutter, MD;  Location: [facility];  Service: Orthopedics;  Laterality: Right;  . SHOULDER SURGERY Right    x4  . TUBAL LIGATION       Current Outpatient Prescriptions:  .  albuterol (PROVENTIL HFA;VENTOLIN HFA) 108 (90 Base) MCG/ACT inhaler, Inhale 2 puffs into the lungs every 6 (six) hours as needed for wheezing., Disp: 1 Inhaler, Rfl: 2 .  alendronate (FOSAMAX) 70 MG tablet, Take 1 tablet (70 mg total) by  mouth once a week. Take with a full glass of water on an empty stomach., Disp: 12 tablet, Rfl: 1 .  budesonide-formoterol (SYMBICORT) 160-4.5 MCG/ACT inhaler, Inhale 1 puff into the lungs 2 (two) times daily., Disp: 1 Inhaler, Rfl: 5 .  calcium-vitamin D (OSCAL WITH D) 500-200 MG-UNIT per tablet, Take 1 tablet by mouth daily.  , Disp: , Rfl:  .  docusate sodium (COLACE) 50 MG capsule, Take 50 mg by mouth as needed for mild constipation. duralax, Disp: , Rfl:  .  enalapril (VASOTEC) 10 MG tablet, Take 1 tablet (10 mg total) by mouth daily with breakfast., Disp: 90 tablet, Rfl: 1 .  meclizine (ANTIVERT) 25 MG tablet, Take 1 tablet (25 mg total) by mouth 2 (two) times daily as needed for dizziness., Disp: 30 tablet, Rfl: 2 .  pravastatin (PRAVACHOL) 40 MG tablet, Take 1 tablet (40 mg total) by mouth at bedtime., Disp: 90 tablet, Rfl: 1 .  ALPRAZolam (XANAX) 0.5 MG tablet, Take 1 tablet (0.5 mg total) by mouth 2 (two) times daily as needed. for anxiety (Patient not taking: Reported on 10/29/2016), Disp: 30 tablet, Rfl: 5  Review of Systems Constitutional: negative Gastrointestinal: negative Genitourinary: hemorrhoids   Objective:  BP 112/68 (BP Location: Right Arm, Patient Position: Sitting, Cuff Size: Normal)   Pulse 100   Ht 5\' 3"  (1.6 m)   Wt 157 lb 3.2 oz (71.3 kg)  BMI 27.85 kg/m    BMI: Body mass index is 27.85 kg/m.  General Appearance: Alert, appropriate appearance for age. No acute distress HEENT: Grossly normal Neck / Thyroid:  Cardiovascular: RRR; normal S1, S2, no murmur Lungs: CTA bilaterally Back: No CVAT Breast Exam: No dimpling, nipple retraction or discharge. No masses or nodes., Normal to inspection, Normal breast tissue bilaterally and No masses or nodes.No dimpling, nipple retraction or discharge. Gastrointestinal: Soft, non-tender, no masses or organomegaly Pelvic Exam: atrophic tissues, tiny uterus,  Rectovaginal: normal rectal, no masses and guaiac negative stool  obtained Lymphatic Exam: Non-palpable nodes in neck, clavicular, axillary, or inguinal regions  Skin: no rash or abnormalities, multiple actinic keratosis seen by dermatologist  Neurologic: Normal gait and speech, no tremor  Psychiatric: Alert and oriented, appropriate affect.  Urinalysis:Not done  Mallory Shirk. MD Pgr [phone] 10:19 AM    By signing my name below, I, [name], attest that this documentation has been prepared under the direction and in the presence of Jonnie Kind, MD. Electronically Signed: [name], Education administrator. 10/29/16. 10:19 AM.  I personally performed the services described in this documentation, which was SCRIBED in my presence. The recorded information has been reviewed and considered accurate. It has been edited as necessary during review. Jonnie Kind, MD

## 2016-10-30 LAB — CYTOLOGY - PAP
Diagnosis: NEGATIVE
HPV: NOT DETECTED

## 2016-11-23 ENCOUNTER — Other Ambulatory Visit: Payer: Self-pay

## 2016-12-17 ENCOUNTER — Other Ambulatory Visit: Payer: Self-pay | Admitting: Obstetrics and Gynecology

## 2016-12-17 DIAGNOSIS — Z1231 Encounter for screening mammogram for malignant neoplasm of breast: Secondary | ICD-10-CM

## 2017-01-29 ENCOUNTER — Ambulatory Visit (INDEPENDENT_AMBULATORY_CARE_PROVIDER_SITE_OTHER): Payer: Medicare Other | Admitting: *Deleted

## 2017-01-29 ENCOUNTER — Telehealth: Payer: Self-pay | Admitting: Family Medicine

## 2017-01-29 DIAGNOSIS — R7302 Impaired glucose tolerance (oral): Secondary | ICD-10-CM

## 2017-01-29 DIAGNOSIS — E782 Mixed hyperlipidemia: Secondary | ICD-10-CM

## 2017-01-29 DIAGNOSIS — I10 Essential (primary) hypertension: Secondary | ICD-10-CM

## 2017-01-29 DIAGNOSIS — Z23 Encounter for immunization: Secondary | ICD-10-CM | POA: Diagnosis not present

## 2017-01-29 NOTE — Telephone Encounter (Signed)
Patient has Lipid, liver, met 7 and hgbA1c 08/2016

## 2017-01-29 NOTE — Telephone Encounter (Signed)
Blood work ordered in EPIC. Patient notified. 

## 2017-01-29 NOTE — Telephone Encounter (Signed)
Patient has an appointment on 03/05/17 with Dr. Richardson Landry.  She is requesting orders for labs.

## 2017-01-29 NOTE — Telephone Encounter (Signed)
same

## 2017-01-31 ENCOUNTER — Other Ambulatory Visit (INDEPENDENT_AMBULATORY_CARE_PROVIDER_SITE_OTHER): Payer: Self-pay | Admitting: *Deleted

## 2017-01-31 ENCOUNTER — Encounter (INDEPENDENT_AMBULATORY_CARE_PROVIDER_SITE_OTHER): Payer: Self-pay | Admitting: *Deleted

## 2017-01-31 DIAGNOSIS — K625 Hemorrhage of anus and rectum: Secondary | ICD-10-CM

## 2017-02-04 ENCOUNTER — Ambulatory Visit (HOSPITAL_COMMUNITY)
Admission: RE | Admit: 2017-02-04 | Discharge: 2017-02-04 | Disposition: A | Discharge status: Home / Self Care | Payer: Medicare Other | Source: Ambulatory Visit | Attending: Obstetrics and Gynecology | Admitting: Obstetrics and Gynecology

## 2017-02-04 DIAGNOSIS — Z1231 Encounter for screening mammogram for malignant neoplasm of breast: Secondary | ICD-10-CM | POA: Diagnosis not present

## 2017-02-19 DIAGNOSIS — K625 Hemorrhage of anus and rectum: Secondary | ICD-10-CM | POA: Diagnosis not present

## 2017-02-19 LAB — CBC
HEMATOCRIT: 43 % (ref 35.0–45.0)
HEMOGLOBIN: 14.8 g/dL (ref 11.7–15.5)
MCH: 31.1 pg (ref 27.0–33.0)
MCHC: 34.4 g/dL (ref 32.0–36.0)
MCV: 90.3 fL (ref 80.0–100.0)
MPV: 11.5 fL (ref 7.5–12.5)
Platelets: 240 10*3/uL (ref 140–400)
RBC: 4.76 10*6/uL (ref 3.80–5.10)
RDW: 12.5 % (ref 11.0–15.0)
WBC: 5.6 10*3/uL (ref 3.8–10.8)

## 2017-02-22 ENCOUNTER — Telehealth (INDEPENDENT_AMBULATORY_CARE_PROVIDER_SITE_OTHER): Payer: Self-pay | Admitting: Internal Medicine

## 2017-02-22 NOTE — Telephone Encounter (Signed)
Patient called, would like her CBC results.  [phone]

## 2017-02-25 NOTE — Telephone Encounter (Signed)
Results have been given to patient 3 stool cards to front desk

## 2017-02-28 DIAGNOSIS — I10 Essential (primary) hypertension: Secondary | ICD-10-CM | POA: Diagnosis not present

## 2017-02-28 DIAGNOSIS — R7302 Impaired glucose tolerance (oral): Secondary | ICD-10-CM | POA: Diagnosis not present

## 2017-02-28 DIAGNOSIS — E782 Mixed hyperlipidemia: Secondary | ICD-10-CM | POA: Diagnosis not present

## 2017-03-01 LAB — LIPID PANEL
CHOL/HDL RATIO: 4 ratio (ref 0.0–4.4)
Cholesterol, Total: 206 mg/dL — ABNORMAL HIGH (ref 100–199)
HDL: 51 mg/dL (ref >39)
LDL CALC: 130 mg/dL — AB (ref 0–99)
Triglycerides: 125 mg/dL (ref 0–149)
VLDL Cholesterol Cal: 25 mg/dL (ref 5–40)

## 2017-03-01 LAB — HEPATIC FUNCTION PANEL
ALT: 23 IU/L (ref 0–32)
AST: 20 IU/L (ref 0–40)
Albumin: 4.7 g/dL (ref 3.5–4.8)
Alkaline Phosphatase: 45 IU/L (ref 39–117)
BILIRUBIN, DIRECT: 0.15 mg/dL (ref 0.00–0.40)
Bilirubin Total: 0.6 mg/dL (ref 0.0–1.2)
Total Protein: 7 g/dL (ref 6.0–8.5)

## 2017-03-01 LAB — BASIC METABOLIC PANEL
BUN/Creatinine Ratio: 23 (ref 12–28)
BUN: 18 mg/dL (ref 8–27)
CALCIUM: 9.4 mg/dL (ref 8.7–10.3)
CO2: 24 mmol/L (ref 20–29)
CREATININE: 0.78 mg/dL (ref 0.57–1.00)
Chloride: 103 mmol/L (ref 96–106)
GFR calc Af Amer: 89 mL/min/{1.73_m2} (ref >59)
GFR calc non Af Amer: 77 mL/min/{1.73_m2} (ref >59)
GLUCOSE: 111 mg/dL — AB (ref 65–99)
Potassium: 4.6 mmol/L (ref 3.5–5.2)
Sodium: 141 mmol/L (ref 134–144)

## 2017-03-01 LAB — HEMOGLOBIN A1C
ESTIMATED AVERAGE GLUCOSE: 123 mg/dL
HEMOGLOBIN A1C: 5.9 % — AB (ref 4.8–5.6)

## 2017-03-04 ENCOUNTER — Telehealth (INDEPENDENT_AMBULATORY_CARE_PROVIDER_SITE_OTHER): Payer: Self-pay | Admitting: *Deleted

## 2017-03-04 NOTE — Telephone Encounter (Signed)
   Diagnosis:    Result(s)   Card 1: Negative:     Card 2: Negative:   Card 3: Negative:    Completed by: Thomas Hoff ,LPN   HEMOCCULT SENSA DEVELOPER: LOT#:  [id number] EXPIRATION DATE: 2020/10   HEMOCCULT SENSA CARD:  LOT#:  [id number] EXPIRATION DATE: 05/20   CARD CONTROL RESULTS:  POSITIVE: Positive NEGATIVE: Negative    ADDITIONAL COMMENTS: Forwarded the results to ordering provider.

## 2017-03-05 ENCOUNTER — Ambulatory Visit (INDEPENDENT_AMBULATORY_CARE_PROVIDER_SITE_OTHER): Payer: Medicare Other | Admitting: Family Medicine

## 2017-03-05 ENCOUNTER — Encounter: Payer: Self-pay | Admitting: Family Medicine

## 2017-03-05 VITALS — BP 120/80 | Ht 63.0 in | Wt 157.0 lb

## 2017-03-05 DIAGNOSIS — R7302 Impaired glucose tolerance (oral): Secondary | ICD-10-CM | POA: Diagnosis not present

## 2017-03-05 DIAGNOSIS — J4540 Moderate persistent asthma, uncomplicated: Secondary | ICD-10-CM

## 2017-03-05 DIAGNOSIS — E782 Mixed hyperlipidemia: Secondary | ICD-10-CM | POA: Diagnosis not present

## 2017-03-05 DIAGNOSIS — I10 Essential (primary) hypertension: Secondary | ICD-10-CM

## 2017-03-05 MED ORDER — BUDESONIDE-FORMOTEROL FUMARATE 160-4.5 MCG/ACT IN AERO: 1.0000 | INHALATION_SPRAY | Freq: Two times a day (BID) | RESPIRATORY_TRACT | 5 refills | Status: DC

## 2017-03-05 MED ORDER — ALENDRONATE SODIUM 70 MG PO TABS: 70.0000 mg | ORAL_TABLET | ORAL | 1 refills | Status: DC

## 2017-03-05 MED ORDER — ENALAPRIL MALEATE 10 MG PO TABS: 10.0000 mg | ORAL_TABLET | Freq: Every day | ORAL | 1 refills | Status: DC

## 2017-03-05 MED ORDER — ALPRAZOLAM 0.5 MG PO TABS: 0.5000 mg | ORAL_TABLET | Freq: Two times a day (BID) | ORAL | 5 refills | Status: DC | PRN

## 2017-03-05 MED ORDER — ALBUTEROL SULFATE HFA 108 (90 BASE) MCG/ACT IN AERS: 2.0000 | INHALATION_SPRAY | Freq: Four times a day (QID) | RESPIRATORY_TRACT | 2 refills | Status: DC | PRN

## 2017-03-05 NOTE — Telephone Encounter (Signed)
Hope, OV in 6 months

## 2017-03-05 NOTE — Progress Notes (Signed)
   Subjective:    Patient ID: Breanna Mason, female    DOB: [birth year], 71 y.o.   MRN: [id number]  Hypertension   Patient is here today to follow up on HTN. She states she is taking Enalapril 10 mg one daily. She eats healthy and does not get much exercise. Having issues with getting moving ,legs are sore.  Results for orders placed or performed in visit on 01/31/17  CBC  Result Value Ref Range   WBC 5.6 3.8 - 10.8 Thousand/uL   RBC 4.76 3.80 - 5.10 Million/uL   Hemoglobin 14.8 11.7 - 15.5 g/dL   HCT 43.0 35.0 - 45.0 %   MCV 90.3 80.0 - 100.0 fL   MCH 31.1 27.0 - 33.0 pg   MCHC 34.4 32.0 - 36.0 g/dL   RDW 12.5 11.0 - 15.0 %   Platelets 240 140 - 400 Thousand/uL   MPV 11.5 7.5 - 12.5 fL   Walking some.   Watching diet  Patient continues to take lipid medication regularly. No obvious side effects from it. Generally does not miss a dose. Prior blood work results are reviewed with patient. Patient continues to work on fat intake in diet  Blood pressure medicine and blood pressure levels reviewed today with patient. Compliant with blood pressure medicine. States does not miss a dose. No obvious side effects. Blood pressure generally good when checked elsewhere. Watching salt intake.   Asthma acting up, pt using prn symbiory.  Again uses albuterol as needed.  Some benefit.  No major shortness of breath with exertion.  Patient compliant with insomnia medication. Generally takes most nights. No obvious morning drowsiness. Definitely helps patient sleep. Without it patient states would not get a good nights rest.   Review of Systems No headache, no major weight loss or weight gain, no chest pain no back pain abdominal pain no change in bowel habits complete ROS otherwise negative     Objective:   Physical Exam Alert and oriented, vitals reviewed and stable, NAD ENT-TM's and ext canals WNL bilat via otoscopic exam Soft palate, tonsils and post pharynx WNL via oropharyngeal  exam Neck-symmetric, no masses; thyroid nonpalpable and nontender Pulmonary-no tachypnea or accessory muscle use; Clear without wheezes via auscultation Card--no abnrml murmurs, rhythm reg and rate WNL Carotid pulses symmetric, without bruits        Assessment & Plan:  Impression 1 hypertension good control discussed to maintain Sinemet  2.  Insomnia ongoing need for meds meds refilled  3.  Hyperlipidemia blood work results reviewed discussed to maintain same  4.  Asthma ongoing with ongoing need for preventive agents discussed and to maintain  Flu shot given diet exercise discussed.  Medications refilled.  Recheck in 6 months

## 2017-03-05 NOTE — Telephone Encounter (Signed)
Results given to patient. All are normal

## 2017-03-07 ENCOUNTER — Encounter (INDEPENDENT_AMBULATORY_CARE_PROVIDER_SITE_OTHER): Payer: Self-pay | Admitting: Internal Medicine

## 2017-03-07 NOTE — Telephone Encounter (Signed)
Appointment was given for 09/04/17 at 8:30am.  A letter was mailed to the patient.

## 2017-04-23 ENCOUNTER — Telehealth: Payer: Self-pay | Admitting: Obstetrics and Gynecology

## 2017-04-23 NOTE — Telephone Encounter (Signed)
Patient called stating that she would like to speak with Dr. Glo Herring or his nurse, pt  states that her vagina is raw and she does place corn starch and that helps a little but not to much. Pt also states that she might have some odor. Please contact pt

## 2017-04-23 NOTE — Telephone Encounter (Signed)
Patient states she experiencing vaginal rawness and irritation. She has tried using corn starch and Vagisil cream with no relief. States she was prescribed something in the past "a cream" but doesn't remember what it was called. Informed patient Dr Glo Herring was out of the office today but would be back tomorrow and would discuss with him them. Stated that was fine.

## 2017-05-09 ENCOUNTER — Ambulatory Visit (INDEPENDENT_AMBULATORY_CARE_PROVIDER_SITE_OTHER): Payer: Medicare Other | Admitting: Adult Health

## 2017-05-09 ENCOUNTER — Encounter: Payer: Self-pay | Admitting: Adult Health

## 2017-05-09 VITALS — BP 126/78 | HR 93 | Resp 16 | Ht 63.0 in | Wt 157.0 lb

## 2017-05-09 DIAGNOSIS — N898 Other specified noninflammatory disorders of vagina: Secondary | ICD-10-CM | POA: Diagnosis not present

## 2017-05-09 DIAGNOSIS — N952 Postmenopausal atrophic vaginitis: Secondary | ICD-10-CM | POA: Diagnosis not present

## 2017-05-09 DIAGNOSIS — N949 Unspecified condition associated with female genital organs and menstrual cycle: Secondary | ICD-10-CM

## 2017-05-09 MED ORDER — FLUCONAZOLE 100 MG PO TABS: ORAL_TABLET | ORAL | 0 refills | Status: DC

## 2017-05-09 NOTE — Progress Notes (Addendum)
Subjective:     Patient ID: Breanna Mason, female   DOB: [birth year], 72 y.o.   MRN: [id number]  HPI Breanna Mason is a 72 year old white female, married, in complaining of vaginal itching and burning, feels very irritated esp at night.She just bought a 2018 explorer and is quite happy with that.   Review of Systems +Vaginal itching +vaginal burning Reviewed past medical,surgical, social and family history. Reviewed medications and allergies.     Objective:   Physical Exam BP 126/78 (BP Location: Left Arm, Patient Position: Sitting, Cuff Size: Normal)   Pulse 93   Resp 16   Ht 5\' 3"  (1.6 m)   Wt 157 lb (71.2 kg)   BMI 27.81 kg/m    Skin warm and dry.Pelvic: external genitalia is normal in appearance no lesions, vagina: red, thin and dry side walls,urethra has no lesions or masses noted, cervix is atrophic and poorly visulaized, uterus: normal size, shape and contour, non tender, no masses felt, adnexa: no masses or tenderness noted. Bladder is non tender and no masses felt. Painted vulva and introitus with gentian violet. PHQ 2 score 0. Assessment:     Itching in the vaginal area  Vaginal burning  Vaginal atrophy     Plan:     Meds ordered this encounter  Medications  . fluconazole (DIFLUCAN) 100 MG tablet    Sig: Take 1 daily for 10 days    Dispense:  10 tablet    Refill:  0    Order Specific Question:   Supervising Provider    Answer:   Tania Ade H [[id number]]     F/U in 2 weeks, if not better,may add estrogen cream

## 2017-05-23 ENCOUNTER — Encounter: Payer: Self-pay | Admitting: Adult Health

## 2017-05-23 ENCOUNTER — Ambulatory Visit (INDEPENDENT_AMBULATORY_CARE_PROVIDER_SITE_OTHER): Payer: Medicare Other | Admitting: Adult Health

## 2017-05-23 VITALS — BP 118/80 | HR 100 | Ht 63.0 in | Wt 158.0 lb

## 2017-05-23 DIAGNOSIS — N898 Other specified noninflammatory disorders of vagina: Secondary | ICD-10-CM

## 2017-05-23 DIAGNOSIS — N952 Postmenopausal atrophic vaginitis: Secondary | ICD-10-CM | POA: Diagnosis not present

## 2017-05-23 DIAGNOSIS — N949 Unspecified condition associated with female genital organs and menstrual cycle: Secondary | ICD-10-CM

## 2017-05-23 MED ORDER — ESTROGENS, CONJUGATED 0.625 MG/GM VA CREA: TOPICAL_CREAM | VAGINAL | 0 refills | Status: DC

## 2017-05-23 NOTE — Progress Notes (Signed)
Subjective:     Patient ID: Breanna Mason, female   DOB: 1946/03/12, 72 y.o.   MRN: [id number]  HPI Breanna Mason is a 72 year old white female back in follow up of taking diflucan and having vulva and vaginal painted with gentian violet,and feels better, still some itching on outside, but not as bad.  Review of Systems Itching and burning in vaginal area  better  Reviewed past medical,surgical, social and family history. Reviewed medications and allergies.     Objective:   Physical Exam BP 118/80 (BP Location: Left Arm, Patient Position: Sitting, Cuff Size: Small)   Pulse 100   Ht 5\' 3"  (1.6 m)   Wt 158 lb (71.7 kg)   BMI 27.99 kg/m    Skin warm and dry, no lesions on vulva, inner labia slightly red and vaginal is atrophic, painted vulva and introitus with gentian violet and will add premarin vaginal cream.   Assessment:     1. Vaginal atrophy   2. Itching in the vaginal area   3. Vaginal burning       Plan:     Meds ordered this encounter  Medications  . conjugated estrogens (PREMARIN) vaginal cream    Sig: Use 1 gm x 2 weeks daily then 2-3 x weekly    Dispense:  24 g    Refill:  0    Order Specific Question:   Supervising Provider    Answer:   Tania Ade H [[id number]]  Follow up in 2 weeks

## 2017-06-06 ENCOUNTER — Ambulatory Visit (INDEPENDENT_AMBULATORY_CARE_PROVIDER_SITE_OTHER): Payer: Medicare Other | Admitting: Adult Health

## 2017-06-06 ENCOUNTER — Other Ambulatory Visit: Payer: Self-pay

## 2017-06-06 ENCOUNTER — Encounter: Payer: Self-pay | Admitting: Adult Health

## 2017-06-06 VITALS — BP 136/82 | HR 85 | Ht 63.0 in | Wt 162.0 lb

## 2017-06-06 DIAGNOSIS — N952 Postmenopausal atrophic vaginitis: Secondary | ICD-10-CM

## 2017-06-06 MED ORDER — ESTROGENS, CONJUGATED 0.625 MG/GM VA CREA: TOPICAL_CREAM | VAGINAL | 4 refills | Status: DC

## 2017-06-06 NOTE — Addendum Note (Signed)
Addended by: Derrek Monaco A on: 06/06/2017 03:53 PM   Modules accepted: Orders

## 2017-06-06 NOTE — Progress Notes (Signed)
Subjective:     Patient ID: Breanna Mason, female   DOB: March 06, 1946, 72 y.o.   MRN: [id number]  HPI Breanna Mason is a 72 year old white female, back in follow up of using premarin vaginal cream and feels much better, no burning or itching.   Review of Systems Feels much better, no burning or itching Reviewed past medical,surgical, social and family history. Reviewed medications and allergies.     Objective:   Physical Exam BP 136/82 (BP Location: Right Arm, Patient Position: Sitting, Cuff Size: Normal)   Pulse 85   Ht 5\' 3"  (1.6 m)   Wt 162 lb (73.5 kg)   BMI 28.70 kg/m   Skin warm and dry, Vulva is not red or irritated and the vaginal tissues are pinker and have more moisture.    Continue PVC. Assessment:     1. Vaginal atrophy       Plan:     Meds ordered this encounter  Medications  . conjugated estrogens (PREMARIN) vaginal cream    Sig: Use 1 gm  2-3 x weekly    Dispense:  30 g    Refill:  4    Order Specific Question:   Supervising Provider    Answer:   Tania Ade H [[id number]]  F/U prn

## 2017-06-07 ENCOUNTER — Telehealth: Payer: Self-pay | Admitting: Adult Health

## 2017-06-07 NOTE — Telephone Encounter (Signed)
Patient called stating that she would like a call back from [facility], Patient did not state the reason why please contact pt

## 2017-06-07 NOTE — Telephone Encounter (Signed)
Patient states she is not able to use discount card for Premarin cream due to having Medicare. States she is not going to have it filled and would like samples when we get some. Pt to call back next week.

## 2017-06-09 ENCOUNTER — Encounter: Payer: Self-pay | Admitting: Adult Health

## 2017-06-10 DIAGNOSIS — H5203 Hypermetropia, bilateral: Secondary | ICD-10-CM | POA: Diagnosis not present

## 2017-06-10 DIAGNOSIS — H2513 Age-related nuclear cataract, bilateral: Secondary | ICD-10-CM | POA: Diagnosis not present

## 2017-06-10 DIAGNOSIS — H5005 Alternating esotropia: Secondary | ICD-10-CM | POA: Diagnosis not present

## 2017-06-10 DIAGNOSIS — H2511 Age-related nuclear cataract, right eye: Secondary | ICD-10-CM | POA: Diagnosis not present

## 2017-06-12 ENCOUNTER — Other Ambulatory Visit: Payer: Self-pay

## 2017-06-12 ENCOUNTER — Encounter (HOSPITAL_COMMUNITY): Payer: Self-pay

## 2017-06-12 ENCOUNTER — Encounter (HOSPITAL_COMMUNITY)
Admission: RE | Admit: 2017-06-12 | Discharge: 2017-06-12 | Disposition: A | Discharge status: Home / Self Care | Payer: Medicare Other | Source: Ambulatory Visit | Attending: Ophthalmology | Admitting: Ophthalmology

## 2017-06-12 DIAGNOSIS — Z01810 Encounter for preprocedural cardiovascular examination: Secondary | ICD-10-CM | POA: Diagnosis not present

## 2017-06-12 DIAGNOSIS — H268 Other specified cataract: Secondary | ICD-10-CM | POA: Diagnosis not present

## 2017-06-12 NOTE — Patient Instructions (Addendum)
Your procedure is scheduled on: 06/17/2017  Report to [facility] at  1200     PM.  Call this number if you have problems the morning of surgery: [phone]   Do not eat food or drink liquids :After Midnight.      Take these medicines the morning of surgery with A SIP OF WATER: xanax, vasotec, antivert.   Do not wear jewelry, make-up or nail polish.  Do not wear lotions, powders, or perfumes. You may wear deodorant.  Do not shave 48 hours prior to surgery.  Do not bring valuables to the hospital.  Contacts, dentures or bridgework may not be worn into surgery.  Leave suitcase in the car. After surgery it may be brought to your room.  For patients admitted to the hospital, checkout time is 11:00 AM the day of discharge.   Patients discharged the day of surgery will not be allowed to drive home.  :     Please read over the following fact sheets that you were given: Coughing and Deep Breathing, Surgical Site Infection Prevention, Anesthesia Post-op Instructions and Care and Recovery After Surgery    Cataract A cataract is a clouding of the lens of the eye. When a lens becomes cloudy, vision is reduced based on the degree and nature of the clouding. Many cataracts reduce vision to some degree. Some cataracts make people more near-sighted as they develop. Other cataracts increase glare. Cataracts that are ignored and become worse can sometimes look white. The white color can be seen through the pupil. CAUSES   Aging. However, cataracts may occur at any age, even in newborns.   Certain drugs.   Trauma to the eye.   Certain diseases such as diabetes.   Specific eye diseases such as chronic inflammation inside the eye or a sudden attack of a rare form of glaucoma.   Inherited or acquired medical problems.  SYMPTOMS   Gradual, progressive drop in vision in the affected eye.   Severe, rapid visual loss. This most often happens when trauma is the cause.  DIAGNOSIS  To detect a cataract,  an eye doctor examines the lens. Cataracts are best diagnosed with an exam of the eyes with the pupils enlarged (dilated) by drops.  TREATMENT  For an early cataract, vision may improve by using different eyeglasses or stronger lighting. If that does not help your vision, surgery is the only effective treatment. A cataract needs to be surgically removed when vision loss interferes with your everyday activities, such as driving, reading, or watching TV. A cataract may also have to be removed if it prevents examination or treatment of another eye problem. Surgery removes the cloudy lens and usually replaces it with a substitute lens (intraocular lens, IOL).  At a time when both you and your doctor agree, the cataract will be surgically removed. If you have cataracts in both eyes, only one is usually removed at a time. This allows the operated eye to heal and be out of danger from any possible problems after surgery (such as infection or poor wound healing). In rare cases, a cataract may be doing damage to your eye. In these cases, your caregiver may advise surgical removal right away. The vast majority of people who have cataract surgery have better vision afterward. HOME CARE INSTRUCTIONS  If you are not planning surgery, you may be asked to do the following:  Use different eyeglasses.   Use stronger or brighter lighting.   Ask your eye doctor about  reducing your medicine dose or changing medicines if it is thought that a medicine caused your cataract. Changing medicines does not make the cataract go away on its own.   Become familiar with your surroundings. Poor vision can lead to injury. Avoid bumping into things on the affected side. You are at a higher risk for tripping or falling.   Exercise extreme care when driving or operating machinery.   Wear sunglasses if you are sensitive to bright light or experiencing problems with glare.  SEEK IMMEDIATE MEDICAL CARE IF:   You have a worsening or  sudden vision loss.   You notice redness, swelling, or increasing pain in the eye.   You have a fever.  Document Released: 04/23/2005 Document Revised: 04/12/2011 Document Reviewed: 12/15/2010 [facility] Patient Information 2012 [place].PATIENT INSTRUCTIONS POST-ANESTHESIA  IMMEDIATELY FOLLOWING SURGERY:  Do not drive or operate machinery for the first twenty four hours after surgery.  Do not make any important decisions for twenty four hours after surgery or while taking narcotic pain medications or sedatives.  If you develop intractable nausea and vomiting or a severe headache please notify your doctor immediately.  FOLLOW-UP:  Please make an appointment with your surgeon as instructed. You do not need to follow up with anesthesia unless specifically instructed to do so.  WOUND CARE INSTRUCTIONS (if applicable):  Keep a dry clean dressing on the anesthesia/puncture wound site if there is drainage.  Once the wound has quit draining you may leave it open to air.  Generally you should leave the bandage intact for twenty four hours unless there is drainage.  If the epidural site drains for more than 36-48 hours please call the anesthesia department.  QUESTIONS?:  Please feel free to call your physician or the hospital operator if you have any questions, and they will be happy to assist you.         Cataract A cataract is cloudiness on the lens of your eye. The lens is the clear part of your eye that is behind your iris and pupil. The lens focuses light on the retina, which lets you see clearly. When a lens becomes cloudy, vision may become blurry. The clouding can range from a tiny dot to complete cloudiness. As some cataracts develop, they make a person more nearsighted. Other cataracts increase glare. Cataracts can worsen over time, and sometimes the pupil can look white. Cataracts get bigger and they cloud more of the lens, making it difficult to see. Cataracts can affect one eye or  both eyes. What are the causes? Most cataracts are associated with age-related eye changes. The eye lens is mostly made up of water and protein. Normally, this protein is arranged in a way that keeps the lens clear. Cataracts develop when protein begins to clump together over time. This clouds the lens and lets less light pass through to the retina, which causes blurry vision. What increases the risk? This condition is more likely to develop in people who:  Are 44 years of age or older.  Have diabetes.  Have high blood pressure.  Takecertain medicines, such as steroids or hormone replacement therapy.  Have had an eye injury.  Have or have had eye inflammation.  Have a family history of cataracts.  Smoke.  Drink alcohol heavily.  Are frequently exposed to sun or very strong light without eye protection.  Are obese.  Have been exposed to large amounts of radiation, lead, or other toxic substances.  Have had eye surgery.  What  are the signs or symptoms? The main symptom of a cataract is blurry vision. Your vision may change or get worse over time. Other symptoms include:  Increased glare.  Seeing a bright ring or halo around light.  Poor night vision.  Double vision in one eye.  Having trouble seeing, even while wearing contact lenses or glasses.  Seeing colors that appear faded.  Trouble telling the difference between blue and purple.  Needing frequent changes to your prescription glasses or contacts.  How is this diagnosed? This condition is diagnosed with a medical history and eye exam. You may need to see an eye specialist (optometrist or ophthalmologist). Your health care provider may enlarge (dilate) your pupils with eye drops to see the back of your eye more clearly and look for signs of cataracts or other damage. You may also have tests, including:  A visual acuity test. This uses a chart to determine the smallest letters that you can see from a specific  distance.  A slit-lamp exam. This uses a microscope to examine small sections of your eye for abnormalities.  Tonometry. This test measures the pressure of the fluid inside your eye.  How is this treated? Treatment depends on the stage of your cataract. For an early cataract, vision may improve by using different eyeglasses or stronger lighting. If that does not help your vision, surgery may be recommended to remove the cataract. If your health care provider thinks your cataract may be linked to any medicines that you are taking, he or she may change your medicines. Follow these instructions at home: Lifestyle  Use stronger or brighter lighting.  Consider using a magnifying glass for reading or other activities.  Become familiar with your surroundings. Having poor vision can put you at a greater risk for tripping, falling, or bumping into things.  Wear sunglasses and a hat if you are sensitive to bright light or are having problems with glare.  Quit smoking if you smoke. If you need help quitting, talk with your health care provider. General instructions  If you are prescribed new eyeglasses, wear them as told by your health care provider.  Take over-the-counter and prescription medicines only as told by your health care provider. Do not change your medicines unless told by your health care provider.  Do not drive or operate heavy machinery if your vision is blurry, particularly at night.  Keep your blood sugar under control, if you have diabetes.  Keep all follow-up visits as told by your health care provider. This is important. Contact a health care provider if:  Your symptoms get worse.  Your vision affects your ability to perform daily activities.  You have new symptoms.  You have a fever. Get help right away if:  You have sudden vision loss.  You have redness, swelling, or increasing pain in your eye.  You develop a headache and sensitivity to light. This  information is not intended to replace advice given to you by your health care provider. Make sure you discuss any questions you have with your health care provider. Document Released: 04/23/2005 Document Revised: 09/01/2015 Document Reviewed: 10/27/2014 Elsevier Interactive Patient Education  2018 [employer].  Cataract A cataract is cloudiness on the lens of your eye. The lens is the clear part of your eye that is behind your iris and pupil. The lens focuses light on the retina, which lets you see clearly. When a lens becomes cloudy, vision may become blurry. The clouding can range from a tiny  dot to complete cloudiness. As some cataracts develop, they make a person more nearsighted. Other cataracts increase glare. Cataracts can worsen over time, and sometimes the pupil can look white. Cataracts get bigger and they cloud more of the lens, making it difficult to see. Cataracts can affect one eye or both eyes. What are the causes? Most cataracts are associated with age-related eye changes. The eye lens is mostly made up of water and protein. Normally, this protein is arranged in a way that keeps the lens clear. Cataracts develop when protein begins to clump together over time. This clouds the lens and lets less light pass through to the retina, which causes blurry vision. What increases the risk? This condition is more likely to develop in people who:  Are 61 years of age or older.  Have diabetes.  Have high blood pressure.  Takecertain medicines, such as steroids or hormone replacement therapy.  Have had an eye injury.  Have or have had eye inflammation.  Have a family history of cataracts.  Smoke.  Drink alcohol heavily.  Are frequently exposed to sun or very strong light without eye protection.  Are obese.  Have been exposed to large amounts of radiation, lead, or other toxic substances.  Have had eye surgery.  What are the signs or symptoms? The main symptom of a  cataract is blurry vision. Your vision may change or get worse over time. Other symptoms include:  Increased glare.  Seeing a bright ring or halo around light.  Poor night vision.  Double vision in one eye.  Having trouble seeing, even while wearing contact lenses or glasses.  Seeing colors that appear faded.  Trouble telling the difference between blue and purple.  Needing frequent changes to your prescription glasses or contacts.  How is this diagnosed? This condition is diagnosed with a medical history and eye exam. You may need to see an eye specialist (optometrist or ophthalmologist). Your health care provider may enlarge (dilate) your pupils with eye drops to see the back of your eye more clearly and look for signs of cataracts or other damage. You may also have tests, including:  A visual acuity test. This uses a chart to determine the smallest letters that you can see from a specific distance.  A slit-lamp exam. This uses a microscope to examine small sections of your eye for abnormalities.  Tonometry. This test measures the pressure of the fluid inside your eye.  How is this treated? Treatment depends on the stage of your cataract. For an early cataract, vision may improve by using different eyeglasses or stronger lighting. If that does not help your vision, surgery may be recommended to remove the cataract. If your health care provider thinks your cataract may be linked to any medicines that you are taking, he or she may change your medicines. Follow these instructions at home: Lifestyle  Use stronger or brighter lighting.  Consider using a magnifying glass for reading or other activities.  Become familiar with your surroundings. Having poor vision can put you at a greater risk for tripping, falling, or bumping into things.  Wear sunglasses and a hat if you are sensitive to bright light or are having problems with glare.  Quit smoking if you smoke. If you need help  quitting, talk with your health care provider. General instructions  If you are prescribed new eyeglasses, wear them as told by your health care provider.  Take over-the-counter and prescription medicines only as told by your health care  provider. Do not change your medicines unless told by your health care provider.  Do not drive or operate heavy machinery if your vision is blurry, particularly at night.  Keep your blood sugar under control, if you have diabetes.  Keep all follow-up visits as told by your health care provider. This is important. Contact a health care provider if:  Your symptoms get worse.  Your vision affects your ability to perform daily activities.  You have new symptoms.  You have a fever. Get help right away if:  You have sudden vision loss.  You have redness, swelling, or increasing pain in your eye.  You develop a headache and sensitivity to light. This information is not intended to replace advice given to you by your health care provider. Make sure you discuss any questions you have with your health care provider. Document Released: 04/23/2005 Document Revised: 09/01/2015 Document Reviewed: 10/27/2014 Elsevier Interactive Patient Education  2018 [employer].  Cataract A cataract is cloudiness on the lens of your eye. The lens is the clear part of your eye that is behind your iris and pupil. The lens focuses light on the retina, which lets you see clearly. When a lens becomes cloudy, vision may become blurry. The clouding can range from a tiny dot to complete cloudiness. As some cataracts develop, they make a person more nearsighted. Other cataracts increase glare. Cataracts can worsen over time, and sometimes the pupil can look white. Cataracts get bigger and they cloud more of the lens, making it difficult to see. Cataracts can affect one eye or both eyes. What are the causes? Most cataracts are associated with age-related eye changes. The eye lens is  mostly made up of water and protein. Normally, this protein is arranged in a way that keeps the lens clear. Cataracts develop when protein begins to clump together over time. This clouds the lens and lets less light pass through to the retina, which causes blurry vision. What increases the risk? This condition is more likely to develop in people who:  Are 53 years of age or older.  Have diabetes.  Have high blood pressure.  Takecertain medicines, such as steroids or hormone replacement therapy.  Have had an eye injury.  Have or have had eye inflammation.  Have a family history of cataracts.  Smoke.  Drink alcohol heavily.  Are frequently exposed to sun or very strong light without eye protection.  Are obese.  Have been exposed to large amounts of radiation, lead, or other toxic substances.  Have had eye surgery.  What are the signs or symptoms? The main symptom of a cataract is blurry vision. Your vision may change or get worse over time. Other symptoms include:  Increased glare.  Seeing a bright ring or halo around light.  Poor night vision.  Double vision in one eye.  Having trouble seeing, even while wearing contact lenses or glasses.  Seeing colors that appear faded.  Trouble telling the difference between blue and purple.  Needing frequent changes to your prescription glasses or contacts.  How is this diagnosed? This condition is diagnosed with a medical history and eye exam. You may need to see an eye specialist (optometrist or ophthalmologist). Your health care provider may enlarge (dilate) your pupils with eye drops to see the back of your eye more clearly and look for signs of cataracts or other damage. You may also have tests, including:  A visual acuity test. This uses a chart to determine the smallest letters  that you can see from a specific distance.  A slit-lamp exam. This uses a microscope to examine small sections of your eye for  abnormalities.  Tonometry. This test measures the pressure of the fluid inside your eye.  How is this treated? Treatment depends on the stage of your cataract. For an early cataract, vision may improve by using different eyeglasses or stronger lighting. If that does not help your vision, surgery may be recommended to remove the cataract. If your health care provider thinks your cataract may be linked to any medicines that you are taking, he or she may change your medicines. Follow these instructions at home: Lifestyle  Use stronger or brighter lighting.  Consider using a magnifying glass for reading or other activities.  Become familiar with your surroundings. Having poor vision can put you at a greater risk for tripping, falling, or bumping into things.  Wear sunglasses and a hat if you are sensitive to bright light or are having problems with glare.  Quit smoking if you smoke. If you need help quitting, talk with your health care provider. General instructions  If you are prescribed new eyeglasses, wear them as told by your health care provider.  Take over-the-counter and prescription medicines only as told by your health care provider. Do not change your medicines unless told by your health care provider.  Do not drive or operate heavy machinery if your vision is blurry, particularly at night.  Keep your blood sugar under control, if you have diabetes.  Keep all follow-up visits as told by your health care provider. This is important. Contact a health care provider if:  Your symptoms get worse.  Your vision affects your ability to perform daily activities.  You have new symptoms.  You have a fever. Get help right away if:  You have sudden vision loss.  You have redness, swelling, or increasing pain in your eye.  You develop a headache and sensitivity to light. This information is not intended to replace advice given to you by your health care provider. Make sure you  discuss any questions you have with your health care provider. Document Released: 04/23/2005 Document Revised: 09/01/2015 Document Reviewed: 10/27/2014 Elsevier Interactive Patient Education  [employer].

## 2017-06-17 ENCOUNTER — Ambulatory Visit (HOSPITAL_COMMUNITY): Payer: Medicare Other | Admitting: Anesthesiology

## 2017-06-17 ENCOUNTER — Encounter (HOSPITAL_COMMUNITY)
Admission: RE | Disposition: A | Discharge status: Home / Self Care | Payer: Self-pay | Source: Ambulatory Visit | Attending: Ophthalmology

## 2017-06-17 ENCOUNTER — Encounter (HOSPITAL_COMMUNITY): Payer: Self-pay

## 2017-06-17 ENCOUNTER — Ambulatory Visit (HOSPITAL_COMMUNITY)
Admission: RE | Admit: 2017-06-17 | Discharge: 2017-06-17 | Disposition: A | Discharge status: Home / Self Care | Payer: Medicare Other | Source: Ambulatory Visit | Attending: Ophthalmology | Admitting: Ophthalmology

## 2017-06-17 DIAGNOSIS — Z79899 Other long term (current) drug therapy: Secondary | ICD-10-CM | POA: Insufficient documentation

## 2017-06-17 DIAGNOSIS — F419 Anxiety disorder, unspecified: Secondary | ICD-10-CM | POA: Insufficient documentation

## 2017-06-17 DIAGNOSIS — J45909 Unspecified asthma, uncomplicated: Secondary | ICD-10-CM | POA: Diagnosis not present

## 2017-06-17 DIAGNOSIS — Z83511 Family history of glaucoma: Secondary | ICD-10-CM | POA: Insufficient documentation

## 2017-06-17 DIAGNOSIS — Z8249 Family history of ischemic heart disease and other diseases of the circulatory system: Secondary | ICD-10-CM | POA: Insufficient documentation

## 2017-06-17 DIAGNOSIS — I10 Essential (primary) hypertension: Secondary | ICD-10-CM | POA: Insufficient documentation

## 2017-06-17 DIAGNOSIS — Z880 Allergy status to penicillin: Secondary | ICD-10-CM | POA: Insufficient documentation

## 2017-06-17 DIAGNOSIS — Z9851 Tubal ligation status: Secondary | ICD-10-CM | POA: Insufficient documentation

## 2017-06-17 DIAGNOSIS — Z833 Family history of diabetes mellitus: Secondary | ICD-10-CM | POA: Diagnosis not present

## 2017-06-17 DIAGNOSIS — Z881 Allergy status to other antibiotic agents status: Secondary | ICD-10-CM | POA: Diagnosis not present

## 2017-06-17 DIAGNOSIS — H2511 Age-related nuclear cataract, right eye: Secondary | ICD-10-CM | POA: Insufficient documentation

## 2017-06-17 DIAGNOSIS — Z8349 Family history of other endocrine, nutritional and metabolic diseases: Secondary | ICD-10-CM | POA: Diagnosis not present

## 2017-06-17 DIAGNOSIS — Z7951 Long term (current) use of inhaled steroids: Secondary | ICD-10-CM | POA: Insufficient documentation

## 2017-06-17 DIAGNOSIS — E7800 Pure hypercholesterolemia, unspecified: Secondary | ICD-10-CM | POA: Insufficient documentation

## 2017-06-17 DIAGNOSIS — M1990 Unspecified osteoarthritis, unspecified site: Secondary | ICD-10-CM | POA: Insufficient documentation

## 2017-06-17 HISTORY — PX: CATARACT EXTRACTION W/PHACO: SHX586

## 2017-06-17 SURGERY — PHACOEMULSIFICATION, CATARACT, WITH IOL INSERTION
Anesthesia: Monitor Anesthesia Care | Site: Eye | Laterality: Right

## 2017-06-17 MED ORDER — TETRACAINE HCL 0.5 % OP SOLN
1.0000 [drp] | OPHTHALMIC | Status: AC
  Administered 2017-06-17 (×3): 1 [drp] via OPHTHALMIC

## 2017-06-17 MED ORDER — NEOSTIGMINE METHYLSULFATE 10 MG/10ML IV SOLN
INTRAVENOUS | Status: AC
  Filled 2017-06-17: qty 1

## 2017-06-17 MED ORDER — LIDOCAINE HCL 3.5 % OP GEL
1.0000 "application " | Freq: Once | OPHTHALMIC | Status: AC
  Administered 2017-06-17: 1 via OPHTHALMIC

## 2017-06-17 MED ORDER — FENTANYL CITRATE (PF) 100 MCG/2ML IJ SOLN
INTRAMUSCULAR | Status: AC
  Filled 2017-06-17: qty 2

## 2017-06-17 MED ORDER — LIDOCAINE HCL (PF) 1 % IJ SOLN
INTRAMUSCULAR | Status: DC | PRN
  Administered 2017-06-17: .5 mL

## 2017-06-17 MED ORDER — LACTATED RINGERS IV SOLN
INTRAVENOUS | Status: DC
  Administered 2017-06-17: 13:00:00 via INTRAVENOUS

## 2017-06-17 MED ORDER — POVIDONE-IODINE 5 % OP SOLN
OPHTHALMIC | Status: DC | PRN
  Administered 2017-06-17: 1 via OPHTHALMIC

## 2017-06-17 MED ORDER — FENTANYL CITRATE (PF) 100 MCG/2ML IJ SOLN
25.0000 ug | Freq: Once | INTRAMUSCULAR | Status: AC
  Administered 2017-06-17: 25 ug via INTRAVENOUS

## 2017-06-17 MED ORDER — BSS IO SOLN
INTRAOCULAR | Status: DC | PRN
  Administered 2017-06-17: 15 mL

## 2017-06-17 MED ORDER — NEOMYCIN-POLYMYXIN-DEXAMETH 3.5-10000-0.1 OP SUSP
OPHTHALMIC | Status: DC | PRN
  Administered 2017-06-17: 2 [drp] via OPHTHALMIC

## 2017-06-17 MED ORDER — EPINEPHRINE PF 1 MG/ML IJ SOLN
INTRAOCULAR | Status: DC | PRN
  Administered 2017-06-17: 500 mL

## 2017-06-17 MED ORDER — ONDANSETRON HCL 4 MG/2ML IJ SOLN
INTRAMUSCULAR | Status: AC
  Filled 2017-06-17: qty 4

## 2017-06-17 MED ORDER — EPINEPHRINE PF 1 MG/ML IJ SOLN
INTRAMUSCULAR | Status: AC
  Filled 2017-06-17: qty 1

## 2017-06-17 MED ORDER — ONDANSETRON HCL 4 MG/2ML IJ SOLN
4.0000 mg | Freq: Once | INTRAMUSCULAR | Status: AC
  Administered 2017-06-17: 4 mg via INTRAVENOUS

## 2017-06-17 MED ORDER — CYCLOPENTOLATE-PHENYLEPHRINE 0.2-1 % OP SOLN
1.0000 [drp] | OPHTHALMIC | Status: AC
  Administered 2017-06-17 (×3): 1 [drp] via OPHTHALMIC

## 2017-06-17 MED ORDER — PROVISC 10 MG/ML IO SOLN
INTRAOCULAR | Status: DC | PRN
  Administered 2017-06-17: 0.85 mL via INTRAOCULAR

## 2017-06-17 MED ORDER — MIDAZOLAM HCL 2 MG/2ML IJ SOLN
INTRAMUSCULAR | Status: AC
  Filled 2017-06-17: qty 2

## 2017-06-17 MED ORDER — MIDAZOLAM HCL 2 MG/2ML IJ SOLN
1.0000 mg | INTRAMUSCULAR | Status: AC
  Administered 2017-06-17: 2 mg via INTRAVENOUS

## 2017-06-17 MED ORDER — PHENYLEPHRINE HCL 2.5 % OP SOLN
1.0000 [drp] | OPHTHALMIC | Status: AC
  Administered 2017-06-17 (×3): 1 [drp] via OPHTHALMIC

## 2017-06-17 SURGICAL SUPPLY — 12 items
CLOTH BEACON ORANGE TIMEOUT ST (SAFETY) ×2 IMPLANT
EYE SHIELD UNIVERSAL CLEAR (GAUZE/BANDAGES/DRESSINGS) ×2 IMPLANT
GLOVE BIOGEL PI IND STRL 6.5 (GLOVE) IMPLANT
GLOVE BIOGEL PI IND STRL 7.0 (GLOVE) IMPLANT
GLOVE BIOGEL PI INDICATOR 6.5 (GLOVE) ×2
GLOVE BIOGEL PI INDICATOR 7.0 (GLOVE) ×2
LENS ALC ACRYL/TECN (Ophthalmic Related) ×2 IMPLANT
PAD ARMBOARD 7.5X6 YLW CONV (MISCELLANEOUS) ×2 IMPLANT
SYRINGE LUER LOK 1CC (MISCELLANEOUS) ×2 IMPLANT
TAPE SURG TRANSPORE 1 IN (GAUZE/BANDAGES/DRESSINGS) IMPLANT
TAPE SURGICAL TRANSPORE 1 IN (GAUZE/BANDAGES/DRESSINGS) ×2
WATER STERILE IRR 250ML POUR (IV SOLUTION) ×2 IMPLANT

## 2017-06-17 NOTE — Anesthesia Postprocedure Evaluation (Signed)
Anesthesia Post Note  Patient: Breanna Mason  Procedure(s) Performed: CATARACT EXTRACTION PHACO AND INTRAOCULAR LENS PLACEMENT RIGHT EYE (Right Eye)  Patient location during evaluation: Short Stay Anesthesia Type: MAC Level of consciousness: awake and alert and patient cooperative Pain management: satisfactory to patient Vital Signs Assessment: post-procedure vital signs reviewed and stable Respiratory status: spontaneous breathing Cardiovascular status: stable Postop Assessment: no apparent nausea or vomiting Anesthetic complications: no     Last Vitals:  Vitals:   06/17/17 1230 06/17/17 1245  BP: 133/83   Pulse:    Resp: 15   Temp:    SpO2: 96% 98%    Last Pain:  Vitals:   06/17/17 1204  TempSrc: Oral                 ,

## 2017-06-17 NOTE — Op Note (Signed)
Date of Admission: 06/17/2017  Date of Surgery: 06/17/2017  Pre-Op Dx: Cataract Right  Eye  Post-Op Dx: Senile Nuclear Cataract  Right  Eye,  Dx Code H25.11  Surgeon: Tonny Branch, M.D.  Assistants: None  Anesthesia: Topical with MAC  Indications: Painless, progressive loss of vision with compromise of daily activities.  Surgery: Cataract Extraction with Intraocular lens Implant Right Eye  Discription: The patient had dilating drops and viscous lidocaine placed into the Right eye in the pre-op holding area. After transfer to the operating room, a time out was performed. The patient was then prepped and draped. Beginning with a 56m blade a paracentesis port was made at the surgeon's 2 o'clock position. The anterior chamber was then filled with 1% non-preserved lidocaine. This was followed by filling the anterior chamber with Provisc.  A 2.410mkeratome blade was used to make a clear corneal incision at the temporal limbus.  A bent cystatome needle was used to create a continuous tear capsulotomy. Hydrodissection was performed with balanced salt solution on a Fine canula. The lens nucleus was then removed using the phacoemulsification handpiece. Residual cortex was removed with the I&A handpiece. The anterior chamber and capsular bag were refilled with Provisc. A posterior chamber intraocular lens was placed into the capsular bag with it's injector. The implant was positioned with the Kuglan hook. The Provisc was then removed from the anterior chamber and capsular bag with the I&A handpiece. Stromal hydration of the main incision and paracentesis port was performed with BSS on a Fine canula. The wounds were tested for leak which was negative. The patient tolerated the procedure well. There were no operative complications. The patient was then transferred to the recovery room in stable condition.  Complications: None  Specimen: None  EBL: None  Prosthetic device: [employer]J Technis, PCB00, power 25.5, SN  [id number]

## 2017-06-17 NOTE — Transfer of Care (Signed)
Immediate Anesthesia Transfer of Care Note  Patient: Breanna Mason  Procedure(s) Performed: CATARACT EXTRACTION PHACO AND INTRAOCULAR LENS PLACEMENT RIGHT EYE (Right Eye)  Patient Location: Short Stay  Anesthesia Type:MAC  Level of Consciousness: awake, alert  and patient cooperative  Airway & Oxygen Therapy: Patient Spontanous Breathing  Post-op Assessment: Report given to RN and Post -op Vital signs reviewed and stable  Post vital signs: Reviewed and stable  Last Vitals:  Vitals:   06/17/17 1230 06/17/17 1245  BP: 133/83   Pulse:    Resp: 15   Temp:    SpO2: 96% 98%    Last Pain:  Vitals:   06/17/17 1204  TempSrc: Oral         Complications: No apparent anesthesia complications

## 2017-06-17 NOTE — Anesthesia Preprocedure Evaluation (Signed)
Anesthesia Evaluation  Patient identified by MRN, date of birth, ID band Patient awake    Reviewed: Allergy & Precautions, H&P , NPO status , Patient's Chart, lab work & pertinent test results  History of Anesthesia Complications (+) PONV and history of anesthetic complications  Airway Mallampati: I   Neck ROM: Full    Dental  (+) Teeth Intact, Partial Upper   Pulmonary asthma ,  Two inhalers, no smoke   breath sounds clear to auscultation       Cardiovascular hypertension, Pt. on medications  Rhythm:Regular  Stress ECHO 2014 normal, EF>55%   Neuro/Psych PSYCHIATRIC DISORDERS Anxiety negative neurological ROS     GI/Hepatic GERD  Medicated,  Endo/Other    Renal/GU      Musculoskeletal  (+) Arthritis ,   Abdominal (+)  Abdomen: soft.    Peds  Hematology   Anesthesia Other Findings   Reproductive/Obstetrics                             Anesthesia Physical Anesthesia Plan  ASA: III  Anesthesia Plan: MAC   Post-op Pain Management:    Induction: Intravenous  PONV Risk Score and Plan:   Airway Management Planned: Nasal Cannula  Additional Equipment:   Intra-op Plan:   Post-operative Plan:   Informed Consent: I have reviewed the patients History and Physical, chart, labs and discussed the procedure including the risks, benefits and alternatives for the proposed anesthesia with the patient or authorized representative who has indicated his/her understanding and acceptance.     Plan Discussed with:   Anesthesia Plan Comments:         Anesthesia Quick Evaluation

## 2017-06-17 NOTE — Discharge Instructions (Signed)

## 2017-06-17 NOTE — Anesthesia Procedure Notes (Signed)
Procedure Name: MAC Date/Time: 06/17/2017 12:46 PM Performed by: Vista Deck, CRNA Pre-anesthesia Checklist: Patient identified, Emergency Drugs available, Suction available, Timeout performed and Patient being monitored Patient Re-evaluated:Patient Re-evaluated prior to induction Oxygen Delivery Method: Nasal Cannula

## 2017-06-17 NOTE — H&P (Signed)
I have reviewed the H&P, the patient was re-examined, and I have identified no interval changes in medical condition and plan of care since the history and physical of record  

## 2017-06-18 ENCOUNTER — Encounter (HOSPITAL_COMMUNITY): Payer: Self-pay | Admitting: Ophthalmology

## 2017-06-24 DIAGNOSIS — H2512 Age-related nuclear cataract, left eye: Secondary | ICD-10-CM | POA: Diagnosis not present

## 2017-06-26 ENCOUNTER — Encounter (HOSPITAL_COMMUNITY)
Admission: RE | Admit: 2017-06-26 | Discharge: 2017-06-26 | Disposition: A | Discharge status: Home / Self Care | Payer: Medicare Other | Source: Ambulatory Visit | Attending: Ophthalmology | Admitting: Ophthalmology

## 2017-06-26 ENCOUNTER — Encounter (HOSPITAL_COMMUNITY): Payer: Self-pay

## 2017-07-01 ENCOUNTER — Ambulatory Visit (HOSPITAL_COMMUNITY): Payer: Medicare Other | Admitting: Anesthesiology

## 2017-07-01 ENCOUNTER — Encounter (HOSPITAL_COMMUNITY)
Admission: RE | Disposition: A | Discharge status: Home / Self Care | Payer: Self-pay | Source: Ambulatory Visit | Attending: Ophthalmology

## 2017-07-01 ENCOUNTER — Encounter (HOSPITAL_COMMUNITY): Payer: Self-pay | Admitting: *Deleted

## 2017-07-01 ENCOUNTER — Ambulatory Visit (HOSPITAL_COMMUNITY)
Admission: RE | Admit: 2017-07-01 | Discharge: 2017-07-01 | Disposition: A | Discharge status: Home / Self Care | Payer: Medicare Other | Source: Ambulatory Visit | Attending: Ophthalmology | Admitting: Ophthalmology

## 2017-07-01 DIAGNOSIS — I10 Essential (primary) hypertension: Secondary | ICD-10-CM | POA: Insufficient documentation

## 2017-07-01 DIAGNOSIS — Z79899 Other long term (current) drug therapy: Secondary | ICD-10-CM | POA: Insufficient documentation

## 2017-07-01 DIAGNOSIS — H2512 Age-related nuclear cataract, left eye: Secondary | ICD-10-CM | POA: Insufficient documentation

## 2017-07-01 DIAGNOSIS — K219 Gastro-esophageal reflux disease without esophagitis: Secondary | ICD-10-CM | POA: Insufficient documentation

## 2017-07-01 HISTORY — PX: CATARACT EXTRACTION W/PHACO: SHX586

## 2017-07-01 SURGERY — PHACOEMULSIFICATION, CATARACT, WITH IOL INSERTION
Anesthesia: Monitor Anesthesia Care | Site: Eye | Laterality: Left

## 2017-07-01 MED ORDER — MIDAZOLAM HCL 2 MG/2ML IJ SOLN
1.0000 mg | Freq: Once | INTRAMUSCULAR | Status: AC | PRN
  Administered 2017-07-01: 2 mg via INTRAVENOUS

## 2017-07-01 MED ORDER — EPINEPHRINE PF 1 MG/ML IJ SOLN
INTRAMUSCULAR | Status: DC | PRN
  Administered 2017-07-01: 500 mL

## 2017-07-01 MED ORDER — MIDAZOLAM HCL 2 MG/2ML IJ SOLN
INTRAMUSCULAR | Status: AC
  Filled 2017-07-01: qty 2

## 2017-07-01 MED ORDER — TETRACAINE HCL 0.5 % OP SOLN
1.0000 [drp] | OPHTHALMIC | Status: AC
  Administered 2017-07-01 (×3): 1 [drp] via OPHTHALMIC

## 2017-07-01 MED ORDER — PHENYLEPHRINE HCL 2.5 % OP SOLN
1.0000 [drp] | OPHTHALMIC | Status: AC
  Administered 2017-07-01 (×3): 1 [drp] via OPHTHALMIC

## 2017-07-01 MED ORDER — NEOMYCIN-POLYMYXIN-DEXAMETH 3.5-10000-0.1 OP SUSP
OPHTHALMIC | Status: DC | PRN
  Administered 2017-07-01: 2 [drp] via OPHTHALMIC

## 2017-07-01 MED ORDER — CYCLOPENTOLATE-PHENYLEPHRINE 0.2-1 % OP SOLN
1.0000 [drp] | OPHTHALMIC | Status: AC
  Administered 2017-07-01 (×3): 1 [drp] via OPHTHALMIC

## 2017-07-01 MED ORDER — LACTATED RINGERS IV SOLN
INTRAVENOUS | Status: DC
  Administered 2017-07-01: 1000 mL via INTRAVENOUS

## 2017-07-01 MED ORDER — PROVISC 10 MG/ML IO SOLN
INTRAOCULAR | Status: DC | PRN
  Administered 2017-07-01: 0.85 mL via INTRAOCULAR

## 2017-07-01 MED ORDER — BSS IO SOLN
INTRAOCULAR | Status: DC | PRN
  Administered 2017-07-01: 15 mL

## 2017-07-01 MED ORDER — LIDOCAINE HCL (PF) 1 % IJ SOLN
INTRAMUSCULAR | Status: DC | PRN
  Administered 2017-07-01: .6 mL

## 2017-07-01 MED ORDER — ONDANSETRON 4 MG PO TBDP
ORAL_TABLET | ORAL | Status: AC
  Filled 2017-07-01: qty 1

## 2017-07-01 MED ORDER — LIDOCAINE HCL 3.5 % OP GEL
1.0000 "application " | Freq: Once | OPHTHALMIC | Status: AC
  Administered 2017-07-01: 1 via OPHTHALMIC

## 2017-07-01 MED ORDER — POVIDONE-IODINE 5 % OP SOLN
OPHTHALMIC | Status: DC | PRN
  Administered 2017-07-01: 1 via OPHTHALMIC

## 2017-07-01 SURGICAL SUPPLY — 12 items
CLOTH BEACON ORANGE TIMEOUT ST (SAFETY) ×2 IMPLANT
EYE SHIELD UNIVERSAL CLEAR (GAUZE/BANDAGES/DRESSINGS) ×2 IMPLANT
GLOVE BIOGEL PI IND STRL 6.5 (GLOVE) IMPLANT
GLOVE BIOGEL PI IND STRL 7.0 (GLOVE) IMPLANT
GLOVE BIOGEL PI INDICATOR 6.5 (GLOVE) ×2
GLOVE BIOGEL PI INDICATOR 7.0 (GLOVE) ×2
LENS ALC ACRYL/TECN (Ophthalmic Related) ×2 IMPLANT
PAD ARMBOARD 7.5X6 YLW CONV (MISCELLANEOUS) ×2 IMPLANT
SYRINGE LUER LOK 1CC (MISCELLANEOUS) ×2 IMPLANT
TAPE SURG TRANSPORE 1 IN (GAUZE/BANDAGES/DRESSINGS) IMPLANT
TAPE SURGICAL TRANSPORE 1 IN (GAUZE/BANDAGES/DRESSINGS) ×2
WATER STERILE IRR 250ML POUR (IV SOLUTION) ×2 IMPLANT

## 2017-07-01 NOTE — Op Note (Signed)
Date of Admission: 07/01/2017  Date of Surgery: 07/01/2017  Pre-Op Dx: Cataract Left  Eye  Post-Op Dx: Senile Nuclear Cataract  Left  Eye,  Dx Code H25.12  Surgeon: Tonny Branch, M.D.  Assistants: None  Anesthesia: Topical with MAC  Indications: Painless, progressive loss of vision with compromise of daily activities.  Surgery: Cataract Extraction with Intraocular lens Implant Left Eye  Discription: The patient had dilating drops and viscous lidocaine placed into the Left eye in the pre-op holding area. After transfer to the operating room, a time out was performed. The patient was then prepped and draped. Beginning with a 38m blade a paracentesis port was made at the surgeon's 2 o'clock position. The anterior chamber was then filled with 1% non-preserved lidocaine. This was followed by filling the anterior chamber with Provisc.  A 2.457mkeratome blade was used to make a clear corneal incision at the temporal limbus.  A bent cystatome needle was used to create a continuous tear capsulotomy. Hydrodissection was performed with balanced salt solution on a Fine canula. The lens nucleus was then removed using the phacoemulsification handpiece. Residual cortex was removed with the I&A handpiece. The anterior chamber and capsular bag were refilled with Provisc. A posterior chamber intraocular lens was placed into the capsular bag with it's injector. The implant was positioned with the Kuglan hook. The Provisc was then removed from the anterior chamber and capsular bag with the I&A handpiece. Stromal hydration of the main incision and paracentesis port was performed with BSS on a Fine canula. The wounds were tested for leak which was negative. The patient tolerated the procedure well. There were no operative complications. The patient was then transferred to the recovery room in stable condition.  Complications: None  Specimen: None  EBL: None  Prosthetic device: [employer] Technis, PCB00, power 26.0, SN  [id number]

## 2017-07-01 NOTE — Discharge Instructions (Signed)

## 2017-07-01 NOTE — Anesthesia Preprocedure Evaluation (Addendum)
Anesthesia Evaluation  Patient identified by MRN, date of birth, ID band Patient awake    History of Anesthesia Complications (+) PONV  Airway Mallampati: I       Dental   Pulmonary asthma (controlled) , pneumonia, resolved,    Pulmonary exam normal        Cardiovascular hypertension, Pt. on medications Normal cardiovascular exam Rhythm:Regular Rate:Normal  12-Jun-2017 15:26:22 [facility]-300 ROUTINE RECORD Normal sinus rhythm Incomplete right bundle branch block Borderline ECG   Neuro/Psych Anxiety    GI/Hepatic GERD  Medicated and Controlled,  Endo/Other    Renal/GU      Musculoskeletal  (+) Arthritis , Osteoarthritis,    Abdominal Normal abdominal exam  (+)   Peds  Hematology   Anesthesia Other Findings   Reproductive/Obstetrics                             Anesthesia Physical Anesthesia Plan  ASA: III  Anesthesia Plan: MAC   Post-op Pain Management:    Induction:   PONV Risk Score and Plan:   Airway Management Planned:   Additional Equipment:   Intra-op Plan:   Post-operative Plan:   Informed Consent: I have reviewed the patients History and Physical, chart, labs and discussed the procedure including the risks, benefits and alternatives for the proposed anesthesia with the patient or authorized representative who has indicated his/her understanding and acceptance.   Dental advisory given  Plan Discussed with: CRNA  Anesthesia Plan Comments:        Anesthesia Quick Evaluation

## 2017-07-01 NOTE — Anesthesia Postprocedure Evaluation (Signed)
Anesthesia Post Note  Patient: Breanna Mason  Procedure(s) Performed: CATARACT EXTRACTION PHACO AND INTRAOCULAR LENS PLACEMENT (IOC) (Left Eye)  Patient location during evaluation: Short Stay Anesthesia Type: MAC Level of consciousness: awake and alert, oriented and patient cooperative Pain management: pain level controlled Vital Signs Assessment: post-procedure vital signs reviewed and stable Respiratory status: spontaneous breathing and respiratory function stable Cardiovascular status: stable Postop Assessment: no apparent nausea or vomiting     Last Vitals:  Vitals:   07/01/17 0955 07/01/17 1000  BP: 127/89 (!) 141/77  Pulse:    Resp: 11 20  Temp:    SpO2: 98% 98%    Last Pain:  Vitals:   07/01/17 0908  TempSrc: Oral                 ,  A

## 2017-07-01 NOTE — Anesthesia Procedure Notes (Signed)
Procedure Name: MAC Date/Time: 07/01/2017 10:01 AM Performed by: Andree Elk Amy A, CRNA Pre-anesthesia Checklist: Patient identified, Emergency Drugs available, Suction available, Timeout performed and Patient being monitored Patient Re-evaluated:Patient Re-evaluated prior to induction Oxygen Delivery Method: Nasal Cannula

## 2017-07-01 NOTE — H&P (Signed)
I have reviewed the H&P, the patient was re-examined, and I have identified no interval changes in medical condition and plan of care since the history and physical of record  

## 2017-07-01 NOTE — Transfer of Care (Signed)
Immediate Anesthesia Transfer of Care Note  Patient: Lorynn Moeser Kargbo  Procedure(s) Performed: CATARACT EXTRACTION PHACO AND INTRAOCULAR LENS PLACEMENT (IOC) (Left Eye)  Patient Location: Short Stay  Anesthesia Type:MAC  Level of Consciousness: awake, alert , oriented and patient cooperative  Airway & Oxygen Therapy: Patient Spontanous Breathing  Post-op Assessment: Report given to RN and Post -op Vital signs reviewed and stable  Post vital signs: Reviewed and stable  Last Vitals:  Vitals:   07/01/17 0955 07/01/17 1000  BP: 127/89 (!) 141/77  Pulse:    Resp: 11 20  Temp:    SpO2: 98% 98%    Last Pain:  Vitals:   07/01/17 0908  TempSrc: Oral      Patients Stated Pain Goal: 7 (16/07/37 1062)  Complications: No apparent anesthesia complications

## 2017-07-02 ENCOUNTER — Encounter (HOSPITAL_COMMUNITY): Payer: Self-pay | Admitting: Ophthalmology

## 2017-07-31 ENCOUNTER — Other Ambulatory Visit: Payer: Self-pay | Admitting: Family Medicine

## 2017-08-02 ENCOUNTER — Telehealth: Payer: Self-pay | Admitting: Family Medicine

## 2017-08-02 DIAGNOSIS — R739 Hyperglycemia, unspecified: Secondary | ICD-10-CM

## 2017-08-02 DIAGNOSIS — E782 Mixed hyperlipidemia: Secondary | ICD-10-CM

## 2017-08-02 DIAGNOSIS — Z79899 Other long term (current) drug therapy: Secondary | ICD-10-CM

## 2017-08-02 NOTE — Telephone Encounter (Signed)
Lip liv A1c 

## 2017-08-02 NOTE — Telephone Encounter (Signed)
Patient last had drawn 02/28/2017 A1c,Bmet,Hepatic,Lipid,Cbc. Same? Please advise.

## 2017-08-02 NOTE — Telephone Encounter (Signed)
Pt is requesting lab orders to be sent over. Last labs per Epic were: a1c,bmp,hepatic and lipid on 02/28/2017.

## 2017-08-02 NOTE — Telephone Encounter (Signed)
Pt is aware.  

## 2017-08-29 DIAGNOSIS — Z79899 Other long term (current) drug therapy: Secondary | ICD-10-CM | POA: Diagnosis not present

## 2017-08-29 DIAGNOSIS — L820 Inflamed seborrheic keratosis: Secondary | ICD-10-CM | POA: Diagnosis not present

## 2017-08-29 DIAGNOSIS — L570 Actinic keratosis: Secondary | ICD-10-CM | POA: Diagnosis not present

## 2017-08-29 DIAGNOSIS — D225 Melanocytic nevi of trunk: Secondary | ICD-10-CM | POA: Diagnosis not present

## 2017-08-29 DIAGNOSIS — D2261 Melanocytic nevi of right upper limb, including shoulder: Secondary | ICD-10-CM | POA: Diagnosis not present

## 2017-08-29 DIAGNOSIS — D485 Neoplasm of uncertain behavior of skin: Secondary | ICD-10-CM | POA: Diagnosis not present

## 2017-08-29 DIAGNOSIS — E782 Mixed hyperlipidemia: Secondary | ICD-10-CM | POA: Diagnosis not present

## 2017-08-29 DIAGNOSIS — R739 Hyperglycemia, unspecified: Secondary | ICD-10-CM | POA: Diagnosis not present

## 2017-08-29 DIAGNOSIS — X32XXXD Exposure to sunlight, subsequent encounter: Secondary | ICD-10-CM | POA: Diagnosis not present

## 2017-08-30 LAB — HEPATIC FUNCTION PANEL
ALBUMIN: 4.5 g/dL (ref 3.5–4.8)
ALK PHOS: 44 IU/L (ref 39–117)
ALT: 24 IU/L (ref 0–32)
AST: 23 IU/L (ref 0–40)
BILIRUBIN TOTAL: 0.3 mg/dL (ref 0.0–1.2)
BILIRUBIN, DIRECT: 0.09 mg/dL (ref 0.00–0.40)
Total Protein: 6.8 g/dL (ref 6.0–8.5)

## 2017-08-30 LAB — LIPID PANEL
CHOLESTEROL TOTAL: 214 mg/dL — AB (ref 100–199)
Chol/HDL Ratio: 3.6 ratio (ref 0.0–4.4)
HDL: 59 mg/dL (ref >39)
LDL CALC: 137 mg/dL — AB (ref 0–99)
TRIGLYCERIDES: 89 mg/dL (ref 0–149)
VLDL CHOLESTEROL CAL: 18 mg/dL (ref 5–40)

## 2017-08-30 LAB — HEMOGLOBIN A1C
Est. average glucose Bld gHb Est-mCnc: 126 mg/dL
Hgb A1c MFr Bld: 6 % — ABNORMAL HIGH (ref 4.8–5.6)

## 2017-09-04 ENCOUNTER — Encounter: Payer: Self-pay | Admitting: Family Medicine

## 2017-09-04 ENCOUNTER — Ambulatory Visit (INDEPENDENT_AMBULATORY_CARE_PROVIDER_SITE_OTHER): Payer: Medicare Other | Admitting: Family Medicine

## 2017-09-04 ENCOUNTER — Ambulatory Visit (INDEPENDENT_AMBULATORY_CARE_PROVIDER_SITE_OTHER): Payer: Medicare Other | Admitting: Internal Medicine

## 2017-09-04 VITALS — BP 138/84 | Ht 63.0 in | Wt 160.4 lb

## 2017-09-04 DIAGNOSIS — R7303 Prediabetes: Secondary | ICD-10-CM | POA: Diagnosis not present

## 2017-09-04 DIAGNOSIS — I10 Essential (primary) hypertension: Secondary | ICD-10-CM | POA: Diagnosis not present

## 2017-09-04 DIAGNOSIS — J4540 Moderate persistent asthma, uncomplicated: Secondary | ICD-10-CM | POA: Diagnosis not present

## 2017-09-04 DIAGNOSIS — E7800 Pure hypercholesterolemia, unspecified: Secondary | ICD-10-CM | POA: Diagnosis not present

## 2017-09-04 MED ORDER — ALENDRONATE SODIUM 70 MG PO TABS: 70.0000 mg | ORAL_TABLET | ORAL | 1 refills | Status: DC

## 2017-09-04 MED ORDER — ALPRAZOLAM 0.5 MG PO TABS: 0.5000 mg | ORAL_TABLET | Freq: Two times a day (BID) | ORAL | 5 refills | Status: DC | PRN

## 2017-09-04 MED ORDER — ENALAPRIL MALEATE 10 MG PO TABS: 10.0000 mg | ORAL_TABLET | Freq: Every day | ORAL | 1 refills | Status: DC

## 2017-09-04 MED ORDER — ATORVASTATIN CALCIUM 40 MG PO TABS: 40.0000 mg | ORAL_TABLET | Freq: Every day | ORAL | 1 refills | Status: DC

## 2017-09-04 MED ORDER — MECLIZINE HCL 25 MG PO TABS: 25.0000 mg | ORAL_TABLET | Freq: Two times a day (BID) | ORAL | 1 refills | Status: DC | PRN

## 2017-09-04 MED ORDER — BUDESONIDE-FORMOTEROL FUMARATE 160-4.5 MCG/ACT IN AERO: 1.0000 | INHALATION_SPRAY | Freq: Two times a day (BID) | RESPIRATORY_TRACT | 3 refills | Status: DC | PRN

## 2017-09-04 NOTE — Progress Notes (Signed)
   Subjective:    Patient ID: Breanna Mason, female    DOB: Feb 13, 1946, 72 y.o.   MRN: [id number]  Hypertension  This is a chronic problem. There are no compliance problems.   Pt states no problems with BP; does need refills on all meds.   Blood pressure medicine and blood pressure levels reviewed today with patient. Compliant with blood pressure medicine. States does not miss a dose. No obvious side effects. Blood pressure generally good when checked elsewhere. Watching salt intake.   Asthma.  Generally good control.  Discussed.  Compliant with medications.  No major challenges with wheezing these days.  Prediabetes.  Compliant with diet.  Trying to exercise more but not always succeeding.  Patient continues to take lipid medication regularly. No obvious side effects from it. Generally does not miss a dose. Prior blood work results are reviewed with patient. Patient continues to work on fat intake in diet  Results for orders placed or performed in visit on 08/02/17  Hepatic function panel  Result Value Ref Range   Total Protein 6.8 6.0 - 8.5 g/dL   Albumin 4.5 3.5 - 4.8 g/dL   Bilirubin Total 0.3 0.0 - 1.2 mg/dL   Bilirubin, Direct 0.09 0.00 - 0.40 mg/dL   Alkaline Phosphatase 44 39 - 117 IU/L   AST 23 0 - 40 IU/L   ALT 24 0 - 32 IU/L  Lipid panel  Result Value Ref Range   Cholesterol, Total 214 (H) 100 - 199 mg/dL   Triglycerides 89 0 - 149 mg/dL   HDL 59 >39 mg/dL   VLDL Cholesterol Cal 18 5 - 40 mg/dL   LDL Calculated 137 (H) 0 - 99 mg/dL   Chol/HDL Ratio 3.6 0.0 - 4.4 ratio  Hemoglobin A1c  Result Value Ref Range   Hgb A1c MFr Bld 6.0 (H) 4.8 - 5.6 %   Est. average glucose Bld gHb Est-mCnc 126 mg/dL     Review of Systems No headache, no major weight loss or weight gain, no chest pain no back pain abdominal pain no change in bowel habits complete ROS otherwise negative     Objective:   Physical Exam   Alert and oriented, vitals reviewed and stable,  NAD ENT-TM's and ext canals WNL bilat via otoscopic exam Soft palate, tonsils and post pharynx WNL via oropharyngeal exam Neck-symmetric, no masses; thyroid nonpalpable and nontender Pulmonary-no tachypnea or accessory muscle use; Clear without wheezes via auscultation Card--no abnrml murmurs, rhythm reg and rate WNL Carotid pulses symmetric, without bruits      Assessment & Plan:  1 impression asthma.  Clinically stable.  To maintain same medication rationale discussed  2.  Hypertension good control discussed maintain same meds  3.  Hyperlipidemia.  Controlled.  Compress discussed with blood work discussed.  To maintain same meds  4.  Prediabetes.  Ongoing challenge.  A1c at 6.0 importance of attention discussed  5.  Osteoporosis patient on [place]  Blood work discussed./Diet exercise discussed/medications refilled.  Follow-up in 6 months as scheduled

## 2017-09-05 ENCOUNTER — Telehealth (INDEPENDENT_AMBULATORY_CARE_PROVIDER_SITE_OTHER): Payer: Self-pay | Admitting: *Deleted

## 2017-09-05 ENCOUNTER — Encounter (INDEPENDENT_AMBULATORY_CARE_PROVIDER_SITE_OTHER): Payer: Self-pay | Admitting: Internal Medicine

## 2017-09-05 ENCOUNTER — Ambulatory Visit (INDEPENDENT_AMBULATORY_CARE_PROVIDER_SITE_OTHER): Payer: Medicare Other | Admitting: Internal Medicine

## 2017-09-05 ENCOUNTER — Encounter (INDEPENDENT_AMBULATORY_CARE_PROVIDER_SITE_OTHER): Payer: Self-pay | Admitting: *Deleted

## 2017-09-05 VITALS — BP 158/80 | HR 92 | Temp 97.4°F | Ht 63.0 in | Wt 159.3 lb

## 2017-09-05 DIAGNOSIS — R195 Other fecal abnormalities: Secondary | ICD-10-CM

## 2017-09-05 MED ORDER — PEG 3350-KCL-NA BICARB-NACL 420 G PO SOLR: 4000.0000 mL | Freq: Once | ORAL | 0 refills | Status: AC

## 2017-09-05 NOTE — Progress Notes (Signed)
Subjective:    Patient ID: Breanna Mason, female    DOB: May 20, 1945, 72 y.o.   MRN: [id number]  HPI Here today for f/u. She tells me she is doing good. She is not had any further rectal bleeding.  Her appetite is good. She has gained 8 pounds since her visit in 2017. BMs are normal. No change in her stools. She does walk occasionally. No dysphagia.  Had cataract surgery in February of this year.     5/14/2015Colonoscopy  Indications:Patient is 72 year old Caucasian female who is here for screening colonoscopy. Her last exam was 10 years ago with removal of single small polyp which possibly was hypoplastic.  Impression:  Examination performed to cecum. Two 5 mm polyps were cold snared and submitted together(splenic flexure and sigmoid colon). Three diverticula at sigmoid colon. External hemorrhoids.  Patient had 2 small polyps removed and they're tubular adenomas. Results reviewed with patient. Next colonoscopy in 7 years.   CBC    Component Value Date/Time   WBC 5.6 02/19/2017 0923   RBC 4.76 02/19/2017 0923   HGB 14.8 02/19/2017 0923   HCT 43.0 02/19/2017 0923   PLT 240 02/19/2017 0923   MCV 90.3 02/19/2017 0923   MCH 31.1 02/19/2017 0923   MCHC 34.4 02/19/2017 0923   RDW 12.5 02/19/2017 0923   LYMPHSABS 1.7 04/10/2013 1321   MONOABS 0.7 04/10/2013 1321   EOSABS 0.1 04/10/2013 1321   BASOSABS 0.0 04/10/2013 1321      Review of Systems Past Medical History:  Diagnosis Date  . Allergy   . Anxiety   . Arthritis   . Asthma   . Essential hypertension, benign   . GERD (gastroesophageal reflux disease)   . IFG (impaired fasting glucose)   . Impaired glucose tolerance   . Mixed hyperlipidemia   . Osteopenia   . Osteoporosis   . Pneumonia    hx  . PONV (postoperative nausea and vomiting)   . Reflux     Past Surgical History:  Procedure Laterality Date  . Bilateral tubal ligation    . CARPAL TUNNEL RELEASE Right    30 yrs  . CATARACT  EXTRACTION W/PHACO Right 06/17/2017   Procedure: CATARACT EXTRACTION PHACO AND INTRAOCULAR LENS PLACEMENT RIGHT EYE;  Surgeon: Tonny Branch, MD;  Location: [facility];  Service: Ophthalmology;  Laterality: Right;  CDE: 17.57  . CATARACT EXTRACTION W/PHACO Left 07/01/2017   Procedure: CATARACT EXTRACTION PHACO AND INTRAOCULAR LENS PLACEMENT (IOC);  Surgeon: Tonny Branch, MD;  Location: [facility];  Service: Ophthalmology;  Laterality: Left;  CDE: 17.35  . CESAREAN SECTION    . COLONOSCOPY N/A 09/17/2013   Procedure: COLONOSCOPY;  Surgeon: Rogene Houston, MD;  Location: [facility];  Service: Endoscopy;  Laterality: N/A;  1200  . REVERSE SHOULDER ARTHROPLASTY Right 03/11/2014   Procedure: RIGHT REVERSE SHOULDER ARTHROPLASTY;  Surgeon: Marin Shutter, MD;  Location: [facility];  Service: Orthopedics;  Laterality: Right;  . SHOULDER SURGERY Right    x4  . TUBAL LIGATION        Current Outpatient Medications on File Prior to Visit  Medication Sig Dispense Refill  . albuterol (PROVENTIL HFA;VENTOLIN HFA) 108 (90 Base) MCG/ACT inhaler Inhale 2 puffs into the lungs every 6 (six) hours as needed for wheezing. 1 Inhaler 2  . alendronate (FOSAMAX) 70 MG tablet Take 1 tablet (70 mg total) by mouth once a week. Take with a full glass of water on an empty stomach. 12 tablet 1  . ALPRAZolam (  XANAX) 0.5 MG tablet Take 1 tablet (0.5 mg total) by mouth 2 (two) times daily as needed for anxiety. for anxiety 30 tablet 5  . atorvastatin (LIPITOR) 40 MG tablet Take 1 tablet (40 mg total) by mouth daily. 90 tablet 1  . budesonide-formoterol (SYMBICORT) 160-4.5 MCG/ACT inhaler Inhale 1 puff into the lungs 2 (two) times daily as needed (shortness of breath). 1 Inhaler 3  . calcium-vitamin D (OSCAL WITH D) 500-200 MG-UNIT per tablet Take 1 tablet by mouth daily.      [name] Kitchen docusate sodium (COLACE) 50 MG capsule Take 50 mg by mouth daily as needed for mild constipation.     . enalapril (VASOTEC) 10 MG tablet Take 1 tablet (10 mg total)  by mouth at bedtime. 90 tablet 1  . meclizine (ANTIVERT) 25 MG tablet Take 1 tablet (25 mg total) by mouth 2 (two) times daily as needed for dizziness. 90 tablet 1   No current facility-administered medications on file prior to visit.         Objective:   Physical Exam Blood pressure (!) 158/80, pulse 92, temperature (!) 97.4 F (36.3 C), height 5\' 3"  (1.6 m), weight 159 lb 4.8 oz (72.3 kg). Alert and oriented. Skin warm and dry. Oral mucosa is moist.   . Sclera anicteric, conjunctivae is pink. Thyroid not enlarged. No cervical lymphadenopathy. Lungs clear. Heart regular rate and rhythm.  Abdomen is soft. Bowel sounds are positive. No hepatomegaly. No abdominal masses felt. No tenderness.  No edema to lower extremities.  Rectal exam: No masses felt. Guaiac positive.          Assessment & Plan:  Rectal bleeding. She was guaiac positive. Am going to go ahead an schedule her for a colonoscopy. Colonic neoplasm, polyp, AVM, hemorrhoid in differential.

## 2017-09-05 NOTE — Telephone Encounter (Signed)
Patient needs trilyte 

## 2017-09-05 NOTE — Patient Instructions (Signed)
The risks of bleeding, perforation and infection were reviewed with patient.  

## 2017-09-08 DIAGNOSIS — R7303 Prediabetes: Secondary | ICD-10-CM | POA: Insufficient documentation

## 2017-09-16 ENCOUNTER — Other Ambulatory Visit (INDEPENDENT_AMBULATORY_CARE_PROVIDER_SITE_OTHER): Payer: Self-pay | Admitting: *Deleted

## 2017-09-16 ENCOUNTER — Encounter (HOSPITAL_COMMUNITY)
Admission: RE | Disposition: A | Discharge status: Home / Self Care | Payer: Self-pay | Source: Ambulatory Visit | Attending: Internal Medicine

## 2017-09-16 ENCOUNTER — Ambulatory Visit (HOSPITAL_COMMUNITY)
Admission: RE | Admit: 2017-09-16 | Discharge: 2017-09-16 | Disposition: A | Discharge status: Home / Self Care | Payer: Medicare Other | Source: Ambulatory Visit | Attending: Internal Medicine | Admitting: Internal Medicine

## 2017-09-16 ENCOUNTER — Other Ambulatory Visit: Payer: Self-pay

## 2017-09-16 ENCOUNTER — Encounter (HOSPITAL_COMMUNITY): Payer: Self-pay | Admitting: *Deleted

## 2017-09-16 DIAGNOSIS — F419 Anxiety disorder, unspecified: Secondary | ICD-10-CM | POA: Diagnosis not present

## 2017-09-16 DIAGNOSIS — Z7983 Long term (current) use of bisphosphonates: Secondary | ICD-10-CM | POA: Insufficient documentation

## 2017-09-16 DIAGNOSIS — K644 Residual hemorrhoidal skin tags: Secondary | ICD-10-CM | POA: Diagnosis not present

## 2017-09-16 DIAGNOSIS — J45909 Unspecified asthma, uncomplicated: Secondary | ICD-10-CM | POA: Insufficient documentation

## 2017-09-16 DIAGNOSIS — I10 Essential (primary) hypertension: Secondary | ICD-10-CM | POA: Diagnosis not present

## 2017-09-16 DIAGNOSIS — Z79899 Other long term (current) drug therapy: Secondary | ICD-10-CM | POA: Diagnosis not present

## 2017-09-16 DIAGNOSIS — E782 Mixed hyperlipidemia: Secondary | ICD-10-CM | POA: Diagnosis not present

## 2017-09-16 DIAGNOSIS — K5730 Diverticulosis of large intestine without perforation or abscess without bleeding: Secondary | ICD-10-CM | POA: Diagnosis not present

## 2017-09-16 DIAGNOSIS — R195 Other fecal abnormalities: Secondary | ICD-10-CM | POA: Insufficient documentation

## 2017-09-16 DIAGNOSIS — Z7951 Long term (current) use of inhaled steroids: Secondary | ICD-10-CM | POA: Insufficient documentation

## 2017-09-16 DIAGNOSIS — K921 Melena: Secondary | ICD-10-CM | POA: Diagnosis not present

## 2017-09-16 DIAGNOSIS — M810 Age-related osteoporosis without current pathological fracture: Secondary | ICD-10-CM | POA: Insufficient documentation

## 2017-09-16 DIAGNOSIS — K598 Other specified functional intestinal disorders: Secondary | ICD-10-CM | POA: Diagnosis not present

## 2017-09-16 HISTORY — PX: COLONOSCOPY: SHX5424

## 2017-09-16 SURGERY — COLONOSCOPY
Anesthesia: Moderate Sedation

## 2017-09-16 MED ORDER — MEPERIDINE HCL 50 MG/ML IJ SOLN
INTRAMUSCULAR | Status: DC | PRN
  Administered 2017-09-16 (×3): 25 mg via INTRAVENOUS

## 2017-09-16 MED ORDER — MEPERIDINE HCL 50 MG/ML IJ SOLN
INTRAMUSCULAR | Status: AC
  Filled 2017-09-16: qty 1

## 2017-09-16 MED ORDER — STERILE WATER FOR IRRIGATION IR SOLN
Status: DC | PRN
  Administered 2017-09-16: 09:00:00

## 2017-09-16 MED ORDER — MIDAZOLAM HCL 5 MG/5ML IJ SOLN
INTRAMUSCULAR | Status: DC | PRN
  Administered 2017-09-16: 1 mg via INTRAVENOUS
  Administered 2017-09-16 (×3): 2 mg via INTRAVENOUS
  Administered 2017-09-16 (×2): 1 mg via INTRAVENOUS

## 2017-09-16 MED ORDER — SODIUM CHLORIDE 0.9 % IV SOLN
INTRAVENOUS | Status: DC
  Administered 2017-09-16: 08:00:00 via INTRAVENOUS

## 2017-09-16 MED ORDER — MIDAZOLAM HCL 5 MG/5ML IJ SOLN
INTRAMUSCULAR | Status: AC
  Filled 2017-09-16: qty 10

## 2017-09-16 NOTE — H&P (Signed)
Breanna Mason is an 72 y.o. female.   Chief Complaint: Patient is here for colonoscopy. HPI: Patient is 72 year old Caucasian female who has a history of colonic adenomas and as noted sporadic hematochezia.  She was seen in the office recently and noted to have heme positive stool.  She denies frank rectal bleeding or melena.  She also denies diarrhea constipation or abdominal pain.  Last colonoscopy was in May 2015 with removal of 2 small tubular adenomas. Family history is negative for CRC.  Past Medical History:  Diagnosis Date  . Allergy   . Anxiety   . Arthritis   . Asthma   . Essential hypertension, benign   . GERD (gastroesophageal reflux disease)   . IFG (impaired fasting glucose)   . Impaired glucose tolerance   . Mixed hyperlipidemia   . Osteopenia   . Osteoporosis   . Pneumonia    hx  . PONV (postoperative nausea and vomiting)   . Reflux     Past Surgical History:  Procedure Laterality Date  . Bilateral tubal ligation    . CARPAL TUNNEL RELEASE Right    30 yrs  . CATARACT EXTRACTION W/PHACO Right 06/17/2017   Procedure: CATARACT EXTRACTION PHACO AND INTRAOCULAR LENS PLACEMENT RIGHT EYE;  Surgeon: Tonny Branch, MD;  Location: [facility];  Service: Ophthalmology;  Laterality: Right;  CDE: 17.57  . CATARACT EXTRACTION W/PHACO Left 07/01/2017   Procedure: CATARACT EXTRACTION PHACO AND INTRAOCULAR LENS PLACEMENT (IOC);  Surgeon: Tonny Branch, MD;  Location: [facility];  Service: Ophthalmology;  Laterality: Left;  CDE: 17.35  . CESAREAN SECTION    . COLONOSCOPY N/A 09/17/2013   Procedure: COLONOSCOPY;  Surgeon: Rogene Houston, MD;  [facility]: [facility];  Service: Endoscopy;  Laterality: N/A;  1200  . REVERSE SHOULDER ARTHROPLASTY Right 03/11/2014   Procedure: RIGHT REVERSE SHOULDER ARTHROPLASTY;  Surgeon: Marin Shutter, MD;  Location: [facility];  Service: Orthopedics;  Laterality: Right;  . SHOULDER SURGERY Right    x4  . TUBAL LIGATION      Family History  Problem Relation  Age of Onset  . Hypertension Father   . Diabetes type II Father   . Cancer Father   . Hypertension Mother   . COPD Mother   . Hypertension Son    Social History:  reports that she has never smoked. She has never used smokeless tobacco. She reports that she does not drink alcohol or use drugs.  Allergies:  Allergies  Allergen Reactions  . Levofloxacin Nausea And Vomiting  . Penicillins Nausea And Vomiting    " Makes me sick" can take cephalosporins Has patient had a PCN reaction causing immediate rash, facial/tongue/throat swelling, SOB or lightheadedness with hypotension: No Has patient had a PCN reaction causing severe rash involving mucus membranes or skin necrosis: No Has patient had a PCN reaction that required hospitalization: No Has patient had a PCN reaction occurring within the last 10 years: No  If all of the above answers are "NO", then may proceed with Cephalosporin use.     Medications Prior to Admission  Medication Sig Dispense Refill  . alendronate (FOSAMAX) 70 MG tablet Take 1 tablet (70 mg total) by mouth once a week. Take with a full glass of water on an empty stomach. 12 tablet 1  . ALPRAZolam (XANAX) 0.5 MG tablet Take 1 tablet (0.5 mg total) by mouth 2 (two) times daily as needed for anxiety. for anxiety 30 tablet 5  . atorvastatin (LIPITOR) 40 MG tablet  Take 1 tablet (40 mg total) by mouth daily. (Patient taking differently: Take 40 mg by mouth at bedtime. ) 90 tablet 1  . calcium-vitamin D (OSCAL WITH D) 500-200 MG-UNIT per tablet Take 1 tablet by mouth daily.      . Carboxymethylcellul-Glycerin (LUBRICATING EYE DROPS OP) Place 1 drop into both eyes daily as needed (dry eyes).    . enalapril (VASOTEC) 10 MG tablet Take 1 tablet (10 mg total) by mouth at bedtime. 90 tablet 1  . acetaminophen (TYLENOL) 500 MG tablet Take 500 mg by mouth daily as needed for moderate pain or headache.    . albuterol (PROVENTIL HFA;VENTOLIN HFA) 108 (90 Base) MCG/ACT inhaler Inhale 2  puffs into the lungs every 6 (six) hours as needed for wheezing. 1 Inhaler 2  . budesonide-formoterol (SYMBICORT) 160-4.5 MCG/ACT inhaler Inhale 1 puff into the lungs 2 (two) times daily as needed (shortness of breath). 1 Inhaler 3  . docusate sodium (COLACE) 50 MG capsule Take 50 mg by mouth daily as needed for mild constipation.     . meclizine (ANTIVERT) 25 MG tablet Take 1 tablet (25 mg total) by mouth 2 (two) times daily as needed for dizziness. 90 tablet 1    No results found for this or any previous visit (from the past 48 hour(s)). No results found.  ROS  Blood pressure (!) 154/75, pulse 92, temperature 98.1 F (36.7 C), temperature source Oral, resp. rate 20, height 5\' 3"  (1.6 m), weight 159 lb (72.1 kg), SpO2 98 %. Physical Exam  Constitutional: She appears well-developed and well-nourished.  HENT:  Mouth/Throat: Oropharynx is clear and moist.  Eyes: Conjunctivae are normal. No scleral icterus.  Neck: No thyromegaly present.  Cardiovascular: Normal rate, regular rhythm and normal heart sounds.  No murmur heard. Respiratory: Effort normal and breath sounds normal.  GI:  Abdomen is symmetrical.  Lower midline scar noted.  Abdomen is soft and nontender with organomegaly or masses.  Musculoskeletal: She exhibits no edema.  Lymphadenopathy:    She has no cervical adenopathy.  Neurological: She is alert.  Skin: Skin is warm and dry.     Assessment/Plan Heme positive stool. History of colonic adenomas. Diagnostic colonoscopy.  Hildred Laser, MD 09/16/2017, 8:34 AM

## 2017-09-16 NOTE — Op Note (Signed)
[facility] Patient Name: Breanna Mason Procedure Date: 09/16/2017 8:21 AM MRN: [id number] Date of Birth: January 29, 1946 Attending MD: Hildred Laser , MD CSN: [id number] Age: 72 Admit Type: Outpatient Procedure:                Colonoscopy Indications:              Heme positive stool Providers:                Hildred Laser, MD, Otis Peak B. Delorise Seller, RN, Nelma Rothman, Technician Referring MD:             Grace Bushy. Luking, Md Medicines:                Meperidine 75 mg IV, Midazolam 9 mg IV Complications:            No immediate complications. Estimated Blood Loss:     Estimated blood loss: none. Procedure:                Pre-Anesthesia Assessment:                           - Prior to the procedure, a History and Physical                            was performed, and patient medications and                            allergies were reviewed. The patient's tolerance of                            previous anesthesia was also reviewed. The risks                            and benefits of the procedure and the sedation                            options and risks were discussed with the patient.                            All questions were answered, and informed consent                            was obtained. Prior Anticoagulants: The patient has                            taken no previous anticoagulant or antiplatelet                            agents. ASA Grade Assessment: II - A patient with                            mild systemic disease. After reviewing the risks  and benefits, the patient was deemed in                            satisfactory condition to undergo the procedure.                           After obtaining informed consent, the colonoscope                            was passed under direct vision. Throughout the                            procedure, the patient's blood pressure, pulse, and   oxygen saturations were monitored continuously. The                            [id number] ([id number]) scope was introduced through                            the anus and advanced to the the ascending colon.                            The colonoscopy was technically difficult and                            complex due to restricted mobility of the colon and                            significant looping. Successful completion of the                            procedure was aided by increasing the dose of                            sedation medication, changing the patient's                            position, withdrawing and reinserting the scope and                            withdrawing the scope and replacing with the                            'babyscope'. The patient tolerated the procedure                            fairly well. The quality of the bowel preparation                            was excellent. The ileocecal valve and the rectum                            were photographed. Scope In: 8:45:39 AM Scope Out: 9:24:32 AM Scope Withdrawal Time: 0 hours 4  minutes 33 seconds  Total Procedure Duration: 0 hours 38 minutes 53 seconds  Findings:      The perianal and digital rectal examinations were normal.      The ascending colon appeared normal.      Scattered medium-mouthed diverticula were found in the sigmoid colon,       descending colon, splenic flexure and transverse colon.      External hemorrhoids were found during retroflexion. The hemorrhoids       were small. Impression:               - Incomplete exam to ascending colon.                           - The ascending colon is normal.                           - Diverticulosis in the sigmoid colon, in the                            descending colon, at the splenic flexure and in the                            transverse colon.                           - External hemorrhoids.                           - No specimens  collected. Moderate Sedation:      Moderate (conscious) sedation was administered by the endoscopy nurse       and supervised by the endoscopist. The following parameters were       monitored: oxygen saturation, heart rate, blood pressure, CO2       capnography and response to care. Total physician intraservice time was       45 minutes. Recommendation:           - Patient has a contact number available for                            emergencies. The signs and symptoms of potential                            delayed complications were discussed with the                            patient. Return to normal activities tomorrow.                            Written discharge instructions were provided to the                            patient.                           - High fiber diet today.                           -  Continue present medications.                           - Perform a virtual colonoscopy at appointment to                            be scheduled. Procedure Code(s):        --- Professional ---                           [phone], 40, Colonoscopy, flexible; diagnostic,                            including collection of specimen(s) by brushing or                            washing, when performed (separate procedure)                           G0500, Moderate sedation services provided by the                            same physician or other qualified health care                            professional performing a gastrointestinal                            endoscopic service that sedation supports,                            requiring the presence of an independent trained                            observer to assist in the monitoring of the                            patient's level of consciousness and physiological                            status; initial 15 minutes of intra-service time;                            patient age 66 years or older (additional time may                             be reported with [phone], as appropriate)                           [phone], Moderate sedation services provided by the                            same physician or other qualified health care  professional performing the diagnostic or                            therapeutic service that the sedation supports,                            requiring the presence of an independent trained                            observer to assist in the monitoring of the                            patient's level of consciousness and physiological                            status; each additional 15 minutes intraservice                            time (List separately in addition to code for                            primary service)                           [phone], Moderate sedation services provided by the                            same physician or other qualified health care                            professional performing the diagnostic or                            therapeutic service that the sedation supports,                            requiring the presence of an independent trained                            observer to assist in the monitoring of the                            patient's level of consciousness and physiological                            status; each additional 15 minutes intraservice                            time (List separately in addition to code for                            primary service) Diagnosis Code(s):        --- Professional ---  K64.4, Residual hemorrhoidal skin tags                           R19.5, Other fecal abnormalities                           K57.30, Diverticulosis of large intestine without                            perforation or abscess without bleeding CPT copyright 2017 American Medical Association. All rights reserved. The codes documented in this report are preliminary and  upon coder review may  be revised to meet current compliance requirements. Hildred Laser, MD Hildred Laser, MD 09/16/2017 9:34:14 AM This report has been signed electronically. Number of Addenda: 0

## 2017-09-16 NOTE — Discharge Instructions (Signed)
Colonoscopy, Adult, Care After This sheet gives you information about how to care for yourself after your procedure. Your doctor may also give you more specific instructions. If you have problems or questions, call your doctor. Follow these instructions at home: General instructions   For the first 24 hours after the procedure: ? Do not drive or use machinery. ? Do not sign important documents. ? Do not drink alcohol. ? Do your daily activities more slowly than normal. ? Eat foods that are soft and easy to digest. ? Rest often.  Take over-the-counter or prescription medicines only as told by your doctor.  It is up to you to get the results of your procedure. Ask your doctor, or the department performing the procedure, when your results will be ready. To help cramping and bloating:  Try walking around.  Put heat on your belly (abdomen) as told by your doctor. Use a heat source that your doctor recommends, such as a moist heat pack or a heating pad. ? Put a towel between your skin and the heat source. ? Leave the heat on for 20-30 minutes. ? Remove the heat if your skin turns bright red. This is especially important if you cannot feel pain, heat, or cold. You can get burned. Eating and drinking  Drink enough fluid to keep your pee (urine) clear or pale yellow.  Return to your normal diet as told by your doctor. Avoid heavy or fried foods that are hard to digest.  Avoid drinking alcohol for as long as told by your doctor. Contact a doctor if:  You have blood in your poop (stool) 2-3 days after the procedure. Get help right away if:  You have more than a small amount of blood in your poop.  You see large clumps of tissue (blood clots) in your poop.  Your belly is swollen.  You feel sick to your stomach (nauseous).  You throw up (vomit).  You have a fever.  You have belly pain that gets worse, and medicine does not help your pain. This information is not intended to  replace advice given to you by your health care provider. Make sure you discuss any questions you have with your health care provider.   Resume usual medications as before. High-fiber diet. No driving for 24 hours. Will schedule virtual colonoscopy in 3 to 4 weeks.  Office will call.   Diverticulosis Diverticulosis is a condition that develops when small pouches (diverticula) form in the wall of the large intestine (colon). The colon is where water is absorbed and stool is formed. The pouches form when the inside layer of the colon pushes through weak spots in the outer layers of the colon. You may have a few pouches or many of them. What are the causes? The cause of this condition is not known. What increases the risk? The following factors may make you more likely to develop this condition:  Being older than age 24. Your risk for this condition increases with age. Diverticulosis is rare among people younger than age 20. By age 1, many people have it.  Eating a low-fiber diet.  Having frequent constipation.  Being overweight.  Not getting enough exercise.  Smoking.  Taking over-the-counter pain medicines, like aspirin and ibuprofen.  Having a family history of diverticulosis.  What are the signs or symptoms? In most people, there are no symptoms of this condition. If you do have symptoms, they may include:  Bloating.  Cramps in the abdomen.  Constipation or  diarrhea.  Pain in the lower left side of the abdomen.  How is this diagnosed? This condition is most often diagnosed during an exam for other colon problems. Because diverticulosis usually has no symptoms, it often cannot be diagnosed independently. This condition may be diagnosed by:  Using a flexible scope to examine the colon (colonoscopy).  Taking an X-ray of the colon after dye has been put into the colon (barium enema).  Doing a CT scan.  How is this treated? You may not need treatment for this  condition if you have never developed an infection related to diverticulosis. If you have had an infection before, treatment may include:  Eating a high-fiber diet. This may include eating more fruits, vegetables, and grains.  Taking a fiber supplement.  Taking a live bacteria supplement (probiotic).  Taking medicine to relax your colon.  Taking antibiotic medicines.  Follow these instructions at home:  Drink 6-8 glasses of water or more each day to prevent constipation.  Try not to strain when you have a bowel movement.  If you have had an infection before: ? Eat more fiber as directed by your health care provider or your diet and nutrition specialist (dietitian). ? Take a fiber supplement or probiotic, if your health care provider approves.  Take over-the-counter and prescription medicines only as told by your health care provider.  If you were prescribed an antibiotic, take it as told by your health care provider. Do not stop taking the antibiotic even if you start to feel better.  Keep all follow-up visits as told by your health care provider. This is important. Contact a health care provider if:  You have pain in your abdomen.  You have bloating.  You have cramps.  You have not had a bowel movement in 3 days. Get help right away if:  Your pain gets worse.  Your bloating becomes very bad.  You have a fever or chills, and your symptoms suddenly get worse.  You vomit.  You have bowel movements that are bloody or black.  You have bleeding from your rectum. Summary  Diverticulosis is a condition that develops when small pouches (diverticula) form in the wall of the large intestine (colon).  You may have a few pouches or many of them.  This condition is most often diagnosed during an exam for other colon problems.  If you have had an infection related to diverticulosis, treatment may include increasing the fiber in your diet, taking supplements, or taking  medicines. This information is not intended to replace advice given to you by your health care provider. Make sure you discuss any questions you have with your health care provider. Document Released: 01/19/2004 Document Revised: 03/12/2016 Document Reviewed: 03/12/2016 Elsevier Interactive Patient Education  2017 [place].   Hemorrhoids Hemorrhoids are swollen veins in and around the rectum or anus. Hemorrhoids can cause pain, itching, or bleeding. Most of the time, they do not cause serious problems. They usually get better with diet changes, lifestyle changes, and other home treatments. Follow these instructions at home: Eating and drinking  Eat foods that have fiber, such as whole grains, beans, nuts, fruits, and vegetables. Ask your doctor about taking products that have added fiber (fibersupplements).  Drink enough fluid to keep your pee (urine) clear or pale yellow. For Pain and Swelling  Take a warm-water bath (sitz bath) for 20 minutes to ease pain. Do this 3-4 times a day.  If directed, put ice on the painful area. It may  be helpful to use ice between your warm baths. ? Put ice in a plastic bag. ? Place a towel between your skin and the bag. ? Leave the ice on for 20 minutes, 2-3 times a day. General instructions  Take over-the-counter and prescription medicines only as told by your doctor. ? Medicated creams and medicines that are inserted into the anus (suppositories) may be used or applied as told.  Exercise often.  Go to the bathroom when you have the urge to poop (to have a bowel movement). Do not wait.  Avoid pushing too hard (straining) when you poop.  Keep the butt area dry and clean. Use wet toilet paper or moist paper towels.  Do not sit on the toilet for a long time. Contact a doctor if:  You have any of these: ? Pain and swelling that do not get better with treatment or medicine. ? Bleeding that will not stop. ? Trouble pooping or you cannot  poop. ? Pain or swelling outside the area of the hemorrhoids. This information is not intended to replace advice given to you by your health care provider. Make sure you discuss any questions you have with your health care provider.    High-Fiber Diet Fiber, also called dietary fiber, is a type of carbohydrate found in fruits, vegetables, whole grains, and beans. A high-fiber diet can have many health benefits. Your health care provider may recommend a high-fiber diet to help:  Prevent constipation. Fiber can make your bowel movements more regular.  Lower your cholesterol.  Relieve hemorrhoids, uncomplicated diverticulosis, or irritable bowel syndrome.  Prevent overeating as part of a weight-loss plan.  Prevent heart disease, type 2 diabetes, and certain cancers.  What is my plan? The recommended daily intake of fiber includes:  38 grams for men under age 46.  30 grams for men over age 36.  66 grams for women under age 60.  51 grams for women over age 72.  You can get the recommended daily intake of dietary fiber by eating a variety of fruits, vegetables, grains, and beans. Your health care provider may also recommend a fiber supplement if it is not possible to get enough fiber through your diet. What do I need to know about a high-fiber diet?  Fiber supplements have not been widely studied for their effectiveness, so it is better to get fiber through food sources.  Always check the fiber content on thenutrition facts label of any prepackaged food. Look for foods that contain at least 5 grams of fiber per serving.  Ask your dietitian if you have questions about specific foods that are related to your condition, especially if those foods are not listed in the following section.  Increase your daily fiber consumption gradually. Increasing your intake of dietary fiber too quickly may cause bloating, cramping, or gas.  Drink plenty of water. Water helps you to digest  fiber. What foods can I eat? Grains Whole-grain breads. Multigrain cereal. Oats and oatmeal. Brown rice. Barley. Bulgur wheat. [place]. Bran muffins. Popcorn. Rye wafer crackers. Vegetables Sweet potatoes. Spinach. Kale. Artichokes. Cabbage. Broccoli. Green peas. Carrots. Squash. Fruits Berries. Pears. Apples. Oranges. Avocados. Prunes and raisins. Dried figs. Meats and Other Protein Sources Navy, kidney, pinto, and soy beans. Split peas. Lentils. Nuts and seeds. Dairy Fiber-fortified yogurt. Beverages Fiber-fortified soy milk. Fiber-fortified orange juice. Other Fiber bars. The items listed above may not be a complete list of recommended foods or beverages. Contact your dietitian for more options. What foods are not  recommended? Grains White bread. Pasta made with refined flour. White rice. Vegetables Fried potatoes. Canned vegetables. Well-cooked vegetables. Fruits Fruit juice. Cooked, strained fruit. Meats and Other Protein Sources Fatty cuts of meat. Fried Sales executive or fried fish. Dairy Milk. Yogurt. Cream cheese. Sour cream. Beverages Soft drinks. Other Cakes and pastries. Butter and oils. The items listed above may not be a complete list of foods and beverages to avoid. Contact your dietitian for more information. What are some tips for including high-fiber foods in my diet?  Eat a wide variety of high-fiber foods.  Make sure that half of all grains consumed each day are whole grains.  Replace breads and cereals made from refined flour or white flour with whole-grain breads and cereals.  Replace white rice with brown rice, bulgur wheat, or millet.  Start the day with a breakfast that is high in fiber, such as a cereal that contains at least 5 grams of fiber per serving.  Use beans in place of meat in soups, salads, or pasta.  Eat high-fiber snacks, such as berries, raw vegetables, nuts, or popcorn. This information is not intended to replace advice given to you by  your health care provider. Make sure you discuss any questions you have with your health care provider.

## 2017-09-17 ENCOUNTER — Other Ambulatory Visit (INDEPENDENT_AMBULATORY_CARE_PROVIDER_SITE_OTHER): Payer: Self-pay | Admitting: *Deleted

## 2017-09-17 ENCOUNTER — Encounter (INDEPENDENT_AMBULATORY_CARE_PROVIDER_SITE_OTHER): Payer: Self-pay | Admitting: *Deleted

## 2017-09-17 DIAGNOSIS — Z5309 Procedure and treatment not carried out because of other contraindication: Secondary | ICD-10-CM

## 2017-09-20 ENCOUNTER — Encounter (HOSPITAL_COMMUNITY): Payer: Self-pay | Admitting: Internal Medicine

## 2017-10-18 ENCOUNTER — Ambulatory Visit (HOSPITAL_COMMUNITY)
Admission: RE | Admit: 2017-10-18 | Discharge: 2017-10-18 | Disposition: A | Discharge status: Home / Self Care | Payer: Medicare Other | Source: Ambulatory Visit | Attending: Internal Medicine | Admitting: Internal Medicine

## 2017-10-18 ENCOUNTER — Other Ambulatory Visit (INDEPENDENT_AMBULATORY_CARE_PROVIDER_SITE_OTHER): Payer: Self-pay | Admitting: Internal Medicine

## 2017-10-18 DIAGNOSIS — Z5309 Procedure and treatment not carried out because of other contraindication: Secondary | ICD-10-CM | POA: Diagnosis not present

## 2017-10-18 DIAGNOSIS — Q433 Congenital malformations of intestinal fixation: Secondary | ICD-10-CM | POA: Insufficient documentation

## 2017-10-18 DIAGNOSIS — K5730 Diverticulosis of large intestine without perforation or abscess without bleeding: Secondary | ICD-10-CM | POA: Diagnosis not present

## 2017-11-27 ENCOUNTER — Encounter: Payer: Self-pay | Admitting: Family Medicine

## 2017-11-27 ENCOUNTER — Ambulatory Visit (INDEPENDENT_AMBULATORY_CARE_PROVIDER_SITE_OTHER): Payer: Medicare Other | Admitting: Family Medicine

## 2017-11-27 VITALS — BP 124/82 | Ht 63.0 in | Wt 159.8 lb

## 2017-11-27 DIAGNOSIS — L03116 Cellulitis of left lower limb: Secondary | ICD-10-CM

## 2017-11-27 MED ORDER — DOXYCYCLINE HYCLATE 100 MG PO TABS: ORAL_TABLET | ORAL | 0 refills | Status: DC

## 2017-11-27 NOTE — Progress Notes (Signed)
   Subjective:    Patient ID: Breanna Mason, female    DOB: Sep 23, 1945, 72 y.o.   MRN: [id number]  HPI Pt here sore on heel. Pt states that she walking out of her garage door and the door caught the back of her left leg; then she was moving around bedroom furniture and then she hit the same area with a nightstand. Happened last week    Door struck aback part of ankle  Swollen now and tender   No fever or chills   Review of Systems No headache, no major weight loss or weight gain, no chest pain no back pain abdominal pain no change in bowel habits complete ROS otherwise negative     Objective:   Physical Exam  Alert vitals stable, NAD. Blood pressure good on repeat. HEENT normal. Lungs clear. Heart regular rate and rhythm. Left posterior Achilles region erythematous tender patch surrounding soft tissue wound      Assessment & Plan:  Impression cellulitis at site of injury plan antibiotics prescribed local measures discussed

## 2017-12-23 ENCOUNTER — Other Ambulatory Visit: Payer: Self-pay | Admitting: Obstetrics and Gynecology

## 2017-12-23 DIAGNOSIS — Z1231 Encounter for screening mammogram for malignant neoplasm of breast: Secondary | ICD-10-CM

## 2017-12-23 DIAGNOSIS — L821 Other seborrheic keratosis: Secondary | ICD-10-CM | POA: Diagnosis not present

## 2018-02-10 ENCOUNTER — Encounter (HOSPITAL_COMMUNITY): Payer: Self-pay

## 2018-02-10 ENCOUNTER — Ambulatory Visit (HOSPITAL_COMMUNITY)
Admission: RE | Admit: 2018-02-10 | Discharge: 2018-02-10 | Disposition: A | Discharge status: Home / Self Care | Payer: Medicare Other | Source: Ambulatory Visit | Attending: Obstetrics and Gynecology | Admitting: Obstetrics and Gynecology

## 2018-02-10 DIAGNOSIS — Z1231 Encounter for screening mammogram for malignant neoplasm of breast: Secondary | ICD-10-CM | POA: Insufficient documentation

## 2018-02-10 DIAGNOSIS — Z961 Presence of intraocular lens: Secondary | ICD-10-CM | POA: Diagnosis not present

## 2018-02-10 DIAGNOSIS — H16223 Keratoconjunctivitis sicca, not specified as Sjogren's, bilateral: Secondary | ICD-10-CM | POA: Diagnosis not present

## 2018-02-10 DIAGNOSIS — H04123 Dry eye syndrome of bilateral lacrimal glands: Secondary | ICD-10-CM | POA: Diagnosis not present

## 2018-02-10 DIAGNOSIS — H26493 Other secondary cataract, bilateral: Secondary | ICD-10-CM | POA: Diagnosis not present

## 2018-02-17 ENCOUNTER — Other Ambulatory Visit: Payer: Self-pay | Admitting: Family Medicine

## 2018-02-26 ENCOUNTER — Other Ambulatory Visit: Payer: Self-pay | Admitting: Family Medicine

## 2018-02-28 ENCOUNTER — Telehealth: Payer: Self-pay | Admitting: Family Medicine

## 2018-02-28 DIAGNOSIS — R7303 Prediabetes: Secondary | ICD-10-CM

## 2018-02-28 DIAGNOSIS — I10 Essential (primary) hypertension: Secondary | ICD-10-CM

## 2018-02-28 DIAGNOSIS — E782 Mixed hyperlipidemia: Secondary | ICD-10-CM

## 2018-02-28 DIAGNOSIS — Z79899 Other long term (current) drug therapy: Secondary | ICD-10-CM

## 2018-02-28 NOTE — Telephone Encounter (Signed)
Patient is aware ordered.

## 2018-02-28 NOTE — Telephone Encounter (Signed)
Lip liv m7 cbc A1c 

## 2018-02-28 NOTE — Telephone Encounter (Signed)
[facility] called stating that patient was there to get labs drawn. No labs have been placed. Last labs in April were A1C, lipid and liver. Pt has appt on 03/10/18. Please advise. Thank you

## 2018-02-28 NOTE — Addendum Note (Signed)
Addended by: Karle Barr on: 02/28/2018 11:27 AM   Modules accepted: Orders

## 2018-02-28 NOTE — Telephone Encounter (Signed)
Pt would like to have lab work done before up coming appt. CB# [id number]

## 2018-03-04 DIAGNOSIS — R7303 Prediabetes: Secondary | ICD-10-CM | POA: Diagnosis not present

## 2018-03-04 DIAGNOSIS — E782 Mixed hyperlipidemia: Secondary | ICD-10-CM | POA: Diagnosis not present

## 2018-03-04 DIAGNOSIS — I10 Essential (primary) hypertension: Secondary | ICD-10-CM | POA: Diagnosis not present

## 2018-03-04 DIAGNOSIS — Z79899 Other long term (current) drug therapy: Secondary | ICD-10-CM | POA: Diagnosis not present

## 2018-03-05 LAB — CBC WITH DIFFERENTIAL/PLATELET
BASOS: 1 %
Basophils Absolute: 0 10*3/uL (ref 0.0–0.2)
EOS (ABSOLUTE): 0.1 10*3/uL (ref 0.0–0.4)
EOS: 2 %
HEMATOCRIT: 42.1 % (ref 34.0–46.6)
Hemoglobin: 14.9 g/dL (ref 11.1–15.9)
IMMATURE GRANS (ABS): 0 10*3/uL (ref 0.0–0.1)
IMMATURE GRANULOCYTES: 0 %
LYMPHS: 30 %
Lymphocytes Absolute: 1.7 10*3/uL (ref 0.7–3.1)
MCH: 32.2 pg (ref 26.6–33.0)
MCHC: 35.4 g/dL (ref 31.5–35.7)
MCV: 91 fL (ref 79–97)
Monocytes Absolute: 0.7 10*3/uL (ref 0.1–0.9)
Monocytes: 13 %
NEUTROS PCT: 54 %
Neutrophils Absolute: 3 10*3/uL (ref 1.4–7.0)
PLATELETS: 252 10*3/uL (ref 150–450)
RBC: 4.63 x10E6/uL (ref 3.77–5.28)
RDW: 12.8 % (ref 12.3–15.4)
WBC: 5.5 10*3/uL (ref 3.4–10.8)

## 2018-03-05 LAB — BASIC METABOLIC PANEL
BUN/Creatinine Ratio: 25 (ref 12–28)
BUN: 16 mg/dL (ref 8–27)
CALCIUM: 9 mg/dL (ref 8.7–10.3)
CO2: 22 mmol/L (ref 20–29)
Chloride: 101 mmol/L (ref 96–106)
Creatinine, Ser: 0.65 mg/dL (ref 0.57–1.00)
GFR calc Af Amer: 103 mL/min/{1.73_m2} (ref >59)
GFR, EST NON AFRICAN AMERICAN: 90 mL/min/{1.73_m2} (ref >59)
Glucose: 106 mg/dL — ABNORMAL HIGH (ref 65–99)
Potassium: 4.7 mmol/L (ref 3.5–5.2)
Sodium: 138 mmol/L (ref 134–144)

## 2018-03-05 LAB — LIPID PANEL
CHOLESTEROL TOTAL: 175 mg/dL (ref 100–199)
Chol/HDL Ratio: 3.6 ratio (ref 0.0–4.4)
HDL: 49 mg/dL (ref >39)
LDL Calculated: 99 mg/dL (ref 0–99)
Triglycerides: 133 mg/dL (ref 0–149)
VLDL Cholesterol Cal: 27 mg/dL (ref 5–40)

## 2018-03-05 LAB — HEMOGLOBIN A1C
ESTIMATED AVERAGE GLUCOSE: 123 mg/dL
Hgb A1c MFr Bld: 5.9 % — ABNORMAL HIGH (ref 4.8–5.6)

## 2018-03-05 LAB — HEPATIC FUNCTION PANEL
ALT: 33 IU/L — ABNORMAL HIGH (ref 0–32)
AST: 21 IU/L (ref 0–40)
Albumin: 4.5 g/dL (ref 3.5–4.8)
Alkaline Phosphatase: 71 IU/L (ref 39–117)
BILIRUBIN, DIRECT: 0.1 mg/dL (ref 0.00–0.40)
Bilirubin Total: 0.3 mg/dL (ref 0.0–1.2)
Total Protein: 6.8 g/dL (ref 6.0–8.5)

## 2018-03-07 ENCOUNTER — Ambulatory Visit: Payer: Medicare Other | Admitting: Family Medicine

## 2018-03-10 ENCOUNTER — Encounter: Payer: Self-pay | Admitting: Family Medicine

## 2018-03-10 ENCOUNTER — Ambulatory Visit (INDEPENDENT_AMBULATORY_CARE_PROVIDER_SITE_OTHER): Payer: Medicare Other | Admitting: Family Medicine

## 2018-03-10 VITALS — BP 138/82 | Ht 63.0 in | Wt 153.4 lb

## 2018-03-10 DIAGNOSIS — J4540 Moderate persistent asthma, uncomplicated: Secondary | ICD-10-CM | POA: Diagnosis not present

## 2018-03-10 DIAGNOSIS — R7303 Prediabetes: Secondary | ICD-10-CM

## 2018-03-10 DIAGNOSIS — E782 Mixed hyperlipidemia: Secondary | ICD-10-CM | POA: Diagnosis not present

## 2018-03-10 DIAGNOSIS — Z23 Encounter for immunization: Secondary | ICD-10-CM | POA: Diagnosis not present

## 2018-03-10 DIAGNOSIS — I10 Essential (primary) hypertension: Secondary | ICD-10-CM | POA: Diagnosis not present

## 2018-03-10 MED ORDER — ALENDRONATE SODIUM 70 MG PO TABS: 70.0000 mg | ORAL_TABLET | ORAL | 1 refills | Status: DC

## 2018-03-10 MED ORDER — ATORVASTATIN CALCIUM 40 MG PO TABS: 40.0000 mg | ORAL_TABLET | Freq: Every day | ORAL | 5 refills | Status: DC

## 2018-03-10 MED ORDER — ENALAPRIL MALEATE 10 MG PO TABS: 10.0000 mg | ORAL_TABLET | Freq: Every day | ORAL | 5 refills | Status: DC

## 2018-03-10 MED ORDER — BUDESONIDE-FORMOTEROL FUMARATE 160-4.5 MCG/ACT IN AERO: 1.0000 | INHALATION_SPRAY | Freq: Two times a day (BID) | RESPIRATORY_TRACT | 5 refills | Status: DC | PRN

## 2018-03-10 MED ORDER — ALPRAZOLAM 0.5 MG PO TABS: 0.5000 mg | ORAL_TABLET | Freq: Two times a day (BID) | ORAL | 5 refills | Status: DC | PRN

## 2018-03-10 NOTE — Progress Notes (Signed)
Subjective:    Patient ID: Breanna Mason, female    DOB: [birth year], 72 y.o.   MRN: [id number] Patient arrives with numerous concerns Hypertension  This is a chronic problem. The current episode started more than 1 year ago. Risk factors for coronary artery disease include post-menopausal state. Treatments tried: vasotec. There are no compliance problems.    Discuss recent labs  Blood pressure medicine and blood pressure levels reviewed today with patient. Compliant with blood pressure medicine. States does not miss a dose. No obvious side effects. Blood pressure generally good when checked elsewhere. Watching salt intake.   Patient continues to take lipid medication regularly. No obvious side effects from it. Generally does not miss a dose. Prior blood work results are reviewed with patient. Patient continues to work on fat intake in diet  Asthma   Recent Results (from the past 2160 hour(s))  Hepatic function panel     Status: Abnormal   Collection Time: 03/04/18  8:16 AM  Result Value Ref Range   Total Protein 6.8 6.0 - 8.5 g/dL   Albumin 4.5 3.5 - 4.8 g/dL   Bilirubin Total 0.3 0.0 - 1.2 mg/dL   Bilirubin, Direct 0.10 0.00 - 0.40 mg/dL   Alkaline Phosphatase 71 39 - 117 IU/L   AST 21 0 - 40 IU/L   ALT 33 (H) 0 - 32 IU/L  Lipid panel     Status: None   Collection Time: 03/04/18  8:16 AM  Result Value Ref Range   Cholesterol, Total 175 100 - 199 mg/dL   Triglycerides 133 0 - 149 mg/dL   HDL 49 >39 mg/dL   VLDL Cholesterol Cal 27 5 - 40 mg/dL   LDL Calculated 99 0 - 99 mg/dL   Chol/HDL Ratio 3.6 0.0 - 4.4 ratio    Comment:                                   T. Chol/HDL Ratio                                             Men  Women                               1/2 Avg.Risk  3.4    3.3                                   Avg.Risk  5.0    4.4                                2X Avg.Risk  9.6    7.1                                3X Avg.Risk 23.4   12.7   Basic metabolic panel      Status: Abnormal   Collection Time: 03/04/18  8:16 AM  Result Value Ref Range   Glucose 106 (H) 65 - 99 mg/dL   BUN 16 8 - 27 mg/dL   Creatinine, Ser 0.65 0.57 -  1.00 mg/dL   GFR calc non Af Amer 90 >59 mL/min/1.73   GFR calc Af Amer 103 >59 mL/min/1.73   BUN/Creatinine Ratio 25 12 - 28   Sodium 138 134 - 144 mmol/L   Potassium 4.7 3.5 - 5.2 mmol/L   Chloride 101 96 - 106 mmol/L   CO2 22 20 - 29 mmol/L   Calcium 9.0 8.7 - 10.3 mg/dL  CBC with Differential/Platelet     Status: None   Collection Time: 03/04/18  8:16 AM  Result Value Ref Range   WBC 5.5 3.4 - 10.8 x10E3/uL   RBC 4.63 3.77 - 5.28 x10E6/uL   Hemoglobin 14.9 11.1 - 15.9 g/dL   Hematocrit 42.1 34.0 - 46.6 %   MCV 91 79 - 97 fL   MCH 32.2 26.6 - 33.0 pg   MCHC 35.4 31.5 - 35.7 g/dL   RDW 12.8 12.3 - 15.4 %   Platelets 252 150 - 450 x10E3/uL   Neutrophils 54 Not Estab. %   Lymphs 30 Not Estab. %   Monocytes 13 Not Estab. %   Eos 2 Not Estab. %   Basos 1 Not Estab. %   Neutrophils Absolute 3.0 1.4 - 7.0 x10E3/uL   Lymphocytes Absolute 1.7 0.7 - 3.1 x10E3/uL   Monocytes Absolute 0.7 0.1 - 0.9 x10E3/uL   EOS (ABSOLUTE) 0.1 0.0 - 0.4 x10E3/uL   Basophils Absolute 0.0 0.0 - 0.2 x10E3/uL   Immature Granulocytes 0 Not Estab. %   Immature Grans (Abs) 0.0 0.0 - 0.1 x10E3/uL  Hemoglobin A1c     Status: Abnormal   Collection Time: 03/04/18  8:16 AM  Result Value Ref Range   Hgb A1c MFr Bld 5.9 (H) 4.8 - 5.6 %    Comment:          Prediabetes: 5.7 - 6.4          Diabetes: >6.4          Glycemic control for adults with diabetes: <7.0    Est. average glucose Bld gHb Est-mCnc 123 mg/dL   Prediabetes overall pretty  Good  Asthma did have flare, off the symbicort , using albuterol couple times per [facility]   Patient compliant with insomnia medication. Generally takes most nights. No obvious morning drowsiness. Definitely helps patient sleep. Without it patient states would not get a good nights rest.     Exercise not  the best  Review of Systems No headache, no major weight loss or weight gain, no chest pain no back pain abdominal pain no change in bowel habits complete ROS otherwise negative     Objective:   Physical Exam Alert and oriented, vitals reviewed and stable, NAD ENT-TM's and ext canals WNL bilat via otoscopic exam Soft palate, tonsils and post pharynx WNL via oropharyngeal exam Neck-symmetric, no masses; thyroid nonpalpable and nontender Pulmonary-no tachypnea or accessory muscle use; Clear without wheezes via auscultation Card--no abnrml murmurs, rhythm reg and rate WNL Carotid pulses symmetric, without bruits       Assessment & Plan:  Impression 1 asthma clinically stable this time patient maintain same therapy.  2.  Hyperlipidemia.  Good control.  Prior results discussed patient maintain same  3.  Insomnia.  Ongoing need for Xanax discussed proper use discussed  4.  Hypertension.  Clinically stable maintain same meds  5.  Osteoporosis.  Patient to maintain Fosamax.  6.  Prediabetes clinically stable  Follow-up in 6 months diet exercise discussed blood work reviewed.  Medications refilled/flu shot today

## 2018-03-11 ENCOUNTER — Other Ambulatory Visit: Payer: Self-pay

## 2018-03-11 ENCOUNTER — Telehealth: Payer: Self-pay | Admitting: Family Medicine

## 2018-03-11 MED ORDER — BUDESONIDE-FORMOTEROL FUMARATE 160-4.5 MCG/ACT IN AERO: 1.0000 | INHALATION_SPRAY | Freq: Two times a day (BID) | RESPIRATORY_TRACT | 5 refills | Status: DC | PRN

## 2018-03-11 MED ORDER — ALPRAZOLAM 0.5 MG PO TABS: 0.5000 mg | ORAL_TABLET | Freq: Two times a day (BID) | ORAL | 5 refills | Status: DC | PRN

## 2018-03-11 MED ORDER — ENALAPRIL MALEATE 10 MG PO TABS: 10.0000 mg | ORAL_TABLET | Freq: Every day | ORAL | 5 refills | Status: DC

## 2018-03-11 MED ORDER — ALENDRONATE SODIUM 70 MG PO TABS: 70.0000 mg | ORAL_TABLET | ORAL | 1 refills | Status: DC

## 2018-03-11 MED ORDER — ATORVASTATIN CALCIUM 40 MG PO TABS: 40.0000 mg | ORAL_TABLET | Freq: Every day | ORAL | 5 refills | Status: DC

## 2018-03-11 NOTE — Telephone Encounter (Signed)
[facility] is stating they didn't receive pts medications yesterday from our office.

## 2018-03-11 NOTE — Telephone Encounter (Signed)
Per pt the pharmacy found her rx's.

## 2018-07-14 ENCOUNTER — Ambulatory Visit (INDEPENDENT_AMBULATORY_CARE_PROVIDER_SITE_OTHER): Payer: Medicare Other | Admitting: Adult Health

## 2018-07-14 ENCOUNTER — Encounter: Payer: Self-pay | Admitting: Adult Health

## 2018-07-14 VITALS — BP 142/87 | HR 103 | Ht 63.0 in | Wt 158.0 lb

## 2018-07-14 DIAGNOSIS — N898 Other specified noninflammatory disorders of vagina: Secondary | ICD-10-CM

## 2018-07-14 DIAGNOSIS — L900 Lichen sclerosus et atrophicus: Secondary | ICD-10-CM

## 2018-07-14 NOTE — Progress Notes (Signed)
Patient ID: Breanna Mason, female   DOB: April 26, 1946, 73 y.o.   MRN: [id number] History of Present Illness: Breanna Mason is a 73 year old white female, married, PM in complaining of feeling raw spot in vagina and wiped blood today.  PCP is Mickie Hillier.    Current Medications, Allergies, Past Medical History, Past Surgical History, Family History and Social History were reviewed in [employer] record.     Review of Systems: Raw spot in vagina Wiped some blood today  +odor.    Physical Exam:BP (!) 142/87 (BP Location: Left Arm, Patient Position: Sitting, Cuff Size: Normal)   Pulse (!) 103   Ht 5\' 3"  (1.6 m)   Wt 158 lb (71.7 kg)   BMI 27.99 kg/m    General:  Well developed, well nourished, no acute distress Skin:  Warm and dry Pelvic:  External genitalia is normal in appearance, except tissues are thinning, has some white areas and there is about 1 cm round area inner left labia that is red and  raw looking, not tender now.  The vagina is pale and dry with loss of rugae. Urethra has no lesions or masses. The cervix is smooth.  Uterus is felt to be normal size, shape, and contour.  No adnexal masses or tenderness noted.Bladder is non tender, no masses felt. Painted inner labia and vagina with gentian violet.  Psych:  No mood changes, alert and cooperative,seems happy Fall risk is low.  Examination chaperoned by Levy Pupa LPN.  Impression: 1. Vaginal irritation   2. Lichen sclerosus et atrophicus       Plan: Pat dry Return in 2 weeks for follow up, may add temovate then

## 2018-07-15 ENCOUNTER — Other Ambulatory Visit: Payer: Self-pay | Admitting: Family Medicine

## 2018-07-28 ENCOUNTER — Telehealth: Payer: Self-pay | Admitting: *Deleted

## 2018-07-28 NOTE — Telephone Encounter (Signed)
Patient made aware of no visitors allowed during her appt.  All screening questions asked and responds no to all. Says her problem is better and will cancel appt for now.

## 2018-07-29 ENCOUNTER — Ambulatory Visit: Payer: Medicare Other | Admitting: Adult Health

## 2018-08-01 ENCOUNTER — Telehealth: Payer: Self-pay | Admitting: Family Medicine

## 2018-08-01 NOTE — Telephone Encounter (Signed)
I advised that her appt may be cancelled and may have a tele visit at that time to wait on a call from the office to change that if need be. She would like to know if she needs lab work done prior to this.  She last had labs drawn on 03/04/2018 A1c,cbc,Bmet,Lipid, hepatic.  Does she still need these ? Please advise.

## 2018-08-01 NOTE — Telephone Encounter (Signed)
Patient needing labs for 6 mth follow up on May 6.

## 2018-08-04 NOTE — Telephone Encounter (Signed)
Currently not rec standard labs for now clinical lab unsafe place to be

## 2018-08-04 NOTE — Telephone Encounter (Signed)
Discussed with pt. Pt verbalized understanding.  °

## 2018-08-18 ENCOUNTER — Other Ambulatory Visit: Payer: Self-pay | Admitting: Family Medicine

## 2018-09-06 ENCOUNTER — Encounter: Payer: Self-pay | Admitting: Family Medicine

## 2018-09-10 ENCOUNTER — Ambulatory Visit (INDEPENDENT_AMBULATORY_CARE_PROVIDER_SITE_OTHER): Payer: Medicare Other | Admitting: Family Medicine

## 2018-09-10 ENCOUNTER — Other Ambulatory Visit: Payer: Self-pay

## 2018-09-10 ENCOUNTER — Ambulatory Visit: Payer: Medicare Other | Admitting: Family Medicine

## 2018-09-10 ENCOUNTER — Encounter: Payer: Self-pay | Admitting: Family Medicine

## 2018-09-10 DIAGNOSIS — F5101 Primary insomnia: Secondary | ICD-10-CM

## 2018-09-10 DIAGNOSIS — I10 Essential (primary) hypertension: Secondary | ICD-10-CM

## 2018-09-10 DIAGNOSIS — E782 Mixed hyperlipidemia: Secondary | ICD-10-CM

## 2018-09-10 DIAGNOSIS — J4540 Moderate persistent asthma, uncomplicated: Secondary | ICD-10-CM

## 2018-09-10 MED ORDER — ALPRAZOLAM 0.5 MG PO TABS: 0.5000 mg | ORAL_TABLET | Freq: Two times a day (BID) | ORAL | 5 refills | Status: DC | PRN

## 2018-09-10 MED ORDER — ALENDRONATE SODIUM 70 MG PO TABS: ORAL_TABLET | ORAL | 1 refills | Status: DC

## 2018-09-10 MED ORDER — ENALAPRIL MALEATE 10 MG PO TABS: 10.0000 mg | ORAL_TABLET | Freq: Every day | ORAL | 1 refills | Status: DC

## 2018-09-10 MED ORDER — ATORVASTATIN CALCIUM 40 MG PO TABS: 40.0000 mg | ORAL_TABLET | Freq: Every day | ORAL | 1 refills | Status: DC

## 2018-09-10 NOTE — Progress Notes (Signed)
   Subjective:    Patient ID: Breanna Mason, female    DOB: [birth year], 73 y.o.   MRN: [id number] Audio plus visual  Patient presents with numerous concerns Hypertension  This is a chronic problem. The current episode started more than 1 year ago.   Virtual Visit via Telephone Note  I connected with Breanna Mason on 09/10/18 at  9:30 AM EDT by telephone and verified that I am speaking with the correct person using two identifiers.  Location: Patient: home Provider: office  I discussed the limitations, risks, security and privacy concerns of performing an evaluation and management service by telephone and the availability of in person appointments. I also discussed with the patient that there may be a patient responsible charge related to this service. The patient expressed understanding and agreed to proceed.   History of Present Illness:    Observations/Objective:   Assessment and Plan:   Follow Up Instructions:    I discussed the assessment and treatment plan with the patient. The patient was provided an opportunity to ask questions and all were answered. The patient agreed with the plan and demonstrated an understanding of the instructions.   The patient was advised to call back or seek an in-person evaluation if the symptoms worsen or if the condition fails to improve as anticipated.  I provided *34minutes of non-face-to-face time during this encounter.  Blood pressure medicine and blood pressure levels reviewed today with patient. Compliant with blood pressure medicine. States does not miss a dose. No obvious side effects. Blood pressure generally good when checked elsewhere. Watching salt intake.   Patient continues to take lipid medication regularly. No obvious side effects from it. Generally does not miss a dose. Prior blood work results are reviewed with patient. Patient continues to work on fat intake in diet  Patient compliant with insomnia medication.  Generally takes most nights. No obvious morning drowsiness. Definitely helps patient sleep. Without it patient states would not get a good nights rest.  Asthma is ongoing.  Patient uses Symbicort faithfully.  Overall definitely helps.  Occasional use of albuterol [place] Bailiff, LPN    Review of Systems No headache, no major weight loss or weight gain, no chest pain no back pain abdominal pain no change in bowel habits complete ROS otherwise negative     Objective:   Physical Exam  Virtual visit      Assessment & Plan:  Impression hypertension.  Good control discussed maintain same meds  2.  Hyperlipidemia prior blood work reviewed discussed to maintain same  3.  Asthma clinically stable exercise encouraged compliance discussed to maintain same  4.  Insomnia ongoing need for meds  Follow-up in 6 months no blood work at this time exercise discussed and encouraged

## 2018-10-23 DIAGNOSIS — H26491 Other secondary cataract, right eye: Secondary | ICD-10-CM | POA: Diagnosis not present

## 2018-10-23 DIAGNOSIS — H50011 Monocular esotropia, right eye: Secondary | ICD-10-CM | POA: Diagnosis not present

## 2018-10-23 DIAGNOSIS — H26493 Other secondary cataract, bilateral: Secondary | ICD-10-CM | POA: Diagnosis not present

## 2018-12-02 ENCOUNTER — Encounter: Payer: Self-pay | Admitting: Family Medicine

## 2018-12-02 ENCOUNTER — Other Ambulatory Visit: Payer: Self-pay

## 2018-12-02 ENCOUNTER — Ambulatory Visit (INDEPENDENT_AMBULATORY_CARE_PROVIDER_SITE_OTHER): Payer: Medicare Other | Admitting: Family Medicine

## 2018-12-02 DIAGNOSIS — N3000 Acute cystitis without hematuria: Secondary | ICD-10-CM

## 2018-12-02 MED ORDER — CEFPROZIL 500 MG PO TABS: 500.0000 mg | ORAL_TABLET | Freq: Two times a day (BID) | ORAL | 0 refills | Status: DC

## 2018-12-02 NOTE — Progress Notes (Signed)
   Subjective:    Patient ID: Breanna Mason, female    DOB: Feb 15, 1946, 73 y.o.   MRN: [id number]  Urinary Tract Infection  This is a new problem. The current episode started in the past 7 days. Pertinent negatives include no nausea or vomiting. She has tried increased fluids for the symptoms.  Pt states that she is not having any frequency or any other UTI symptoms, she is just having an odor when she urinates. Pt states she has had issues like this before. She denies high fever chills flank pain denies true hematuria states she had some vaginal irritation with a small amount of blood when she wiped but she has not seen this since the irritation cleared Virtual Visit via Video Note  I connected with Breanna Mason on 12/02/18 at  3:50 PM EDT by a video enabled telemedicine application and verified that I am speaking with the correct person using two identifiers.  Location: Patient: home Provider: office   I discussed the limitations of evaluation and management by telemedicine and the availability of in person appointments. The patient expressed understanding and agreed to proceed.  History of Present Illness:    Observations/Objective:   Assessment and Plan:   Follow Up Instructions:    I discussed the assessment and treatment plan with the patient. The patient was provided an opportunity to ask questions and all were answered. The patient agreed with the plan and demonstrated an understanding of the instructions.   The patient was advised to call back or seek an in-person evaluation if the symptoms worsen or if the condition fails to improve as anticipated.  I provided 15 minutes of non-face-to-face time during this encounter.   Vicente Males, LPN    Review of Systems  Constitutional: Negative for activity change and appetite change.  HENT: Negative for congestion and rhinorrhea.   Respiratory: Negative for cough and shortness of breath.   Cardiovascular:  Negative for chest pain and leg swelling.  Gastrointestinal: Negative for abdominal pain, nausea and vomiting.  Skin: Negative for color change.  Neurological: Negative for dizziness and weakness.  Psychiatric/Behavioral: Negative for agitation and confusion.  Patient complains of vaginal odor and some irritation     Objective:   Physical Exam  Patient had virtual visit Appears to be in no distress Atraumatic Neuro able to relate and oriented No apparent resp distress Color normal       Assessment & Plan:  Probable UTI I recommend Cefzil twice daily for the next 5 days-patient can tolerate cephalosporins If progressive troubles or worse to follow-up  Patient also has vaginal atrophy that probably caused a small amount of blood when she wiped she stated her vagina was irritated I told her if this continues it is important for her to be checked with [place] for which Derrek Monaco her GYN

## 2018-12-29 ENCOUNTER — Other Ambulatory Visit (HOSPITAL_COMMUNITY): Payer: Self-pay | Admitting: Obstetrics and Gynecology

## 2018-12-29 DIAGNOSIS — Z1231 Encounter for screening mammogram for malignant neoplasm of breast: Secondary | ICD-10-CM

## 2019-01-02 ENCOUNTER — Telehealth: Payer: Self-pay | Admitting: Family Medicine

## 2019-01-02 MED ORDER — ATORVASTATIN CALCIUM 40 MG PO TABS: 40.0000 mg | ORAL_TABLET | Freq: Every day | ORAL | 1 refills | Status: DC

## 2019-01-02 MED ORDER — ENALAPRIL MALEATE 10 MG PO TABS: 10.0000 mg | ORAL_TABLET | Freq: Every day | ORAL | 1 refills | Status: DC

## 2019-01-02 MED ORDER — ALENDRONATE SODIUM 70 MG PO TABS: ORAL_TABLET | ORAL | 1 refills | Status: DC

## 2019-01-02 NOTE — Telephone Encounter (Signed)
Medications sent to pharmacy

## 2019-01-02 NOTE — Telephone Encounter (Signed)
Ok six mo worth 

## 2019-01-02 NOTE — Addendum Note (Signed)
Addended by: Vicente Males on: 01/02/2019 02:30 PM   Modules accepted: Orders

## 2019-01-02 NOTE — Telephone Encounter (Signed)
Pharmacy [facility]) requesting refill on Alendronate 70 mg tablet; take one tablet by mouth once a week.  Atorvastatin 40 mg- one tablet by mouth daily Enalapril 10 mg- one tablet at bedtime.  Pt last seen 09/10/2018 for mixed hyperlipidemia. Please advise. Thank you

## 2019-01-09 ENCOUNTER — Ambulatory Visit (INDEPENDENT_AMBULATORY_CARE_PROVIDER_SITE_OTHER): Payer: Medicare Other | Admitting: Nurse Practitioner

## 2019-01-09 ENCOUNTER — Other Ambulatory Visit: Payer: Self-pay

## 2019-01-09 DIAGNOSIS — R110 Nausea: Secondary | ICD-10-CM | POA: Diagnosis not present

## 2019-01-09 DIAGNOSIS — K219 Gastro-esophageal reflux disease without esophagitis: Secondary | ICD-10-CM

## 2019-01-09 MED ORDER — ONDANSETRON 8 MG PO TBDP: 8.0000 mg | ORAL_TABLET | Freq: Three times a day (TID) | ORAL | 0 refills | Status: DC | PRN

## 2019-01-09 MED ORDER — FAMOTIDINE 40 MG PO TABS: 40.0000 mg | ORAL_TABLET | Freq: Every day | ORAL | 0 refills | Status: DC

## 2019-01-09 NOTE — Progress Notes (Signed)
Phone call Subjective:    Patient ID: NATORI HODGENS, female    DOB: 06-Oct-1945, 73 y.o.   MRN: [id number]  HPI nausea started last week. Comes and goes. No vomiting, no fever. Eases up after bowel movement.   Virtual Visit via Telephone Note  I connected with KEYLEE VANNORMAN on 01/09/19 at  3:40 PM EDT by telephone and verified that I am speaking with the correct person using two identifiers.  Location: Patient: home Provider: office   I discussed the limitations, risks, security and privacy concerns of performing an evaluation and management service by telephone and the availability of in person appointments. I also discussed with the patient that there may be a patient responsible charge related to this service. The patient expressed understanding and agreed to proceed.   History of Present Illness: Presents for complaints of nausea that began last week.  Started with "an upset stomach".  Occurs mainly in the morning.  Sometimes during or after meals.  Has had some loose BMs, no change in the color of her stools.  No fever.  No vomiting.  Sometimes worse with greasy foods which she limits.  Has been eating a lot of tomatoes out of her garden.  Has a history of acid reflux.  Has been off her ranitidine for a long time.  No abdominal pain.  Has been under more stress due to COVID.  No caffeine tobacco alcohol or NSAID use.  Does not eat spicy foods.   Observations/Objective: Today's visit was via telephone Physical exam was not possible for this visit Alert, oriented.  Thoughts logical coherent and relevant.  Assessment and Plan: Problem List Items Addressed This Visit      Digestive   Esophageal reflux - Primary   Relevant Medications   famotidine (PEPCID) 40 MG tablet   ondansetron (ZOFRAN-ODT) 8 MG disintegrating tablet    Other Visit Diagnoses    Nausea         Meds ordered this encounter  Medications  . famotidine (PEPCID) 40 MG tablet    Sig: Take 1 tablet (40 mg  total) by mouth daily. Prn acid reflux    Dispense:  30 tablet    Refill:  0    Order Specific Question:   Supervising Provider    Answer:   Sallee Lange A [[id number]]  . ondansetron (ZOFRAN-ODT) 8 MG disintegrating tablet    Sig: Take 1 tablet (8 mg total) by mouth every 8 (eight) hours as needed for nausea or vomiting.    Dispense:  30 tablet    Refill:  0    Order Specific Question:   Supervising Provider    Answer:   Sallee Lange A [[id number]]     Follow Up Instructions: Start with famotidine as directed PRN.  Reviewed lifestyle factors affecting her reflux symptoms particularly eating tomatoes.  Warning signs reviewed.  Call back by the end of next week if no improvement, call or go to ED sooner if worse.   I discussed the assessment and treatment plan with the patient. The patient was provided an opportunity to ask questions and all were answered. The patient agreed with the plan and demonstrated an understanding of the instructions.   The patient was advised to call back or seek an in-person evaluation if the symptoms worsen or if the condition fails to improve as anticipated.  I provided 15 minutes of non-face-to-face time during this encounter.    Review of Systems     Objective:  Physical Exam        Assessment & Plan:

## 2019-01-10 ENCOUNTER — Encounter: Payer: Self-pay | Admitting: Nurse Practitioner

## 2019-01-21 DIAGNOSIS — B070 Plantar wart: Secondary | ICD-10-CM | POA: Diagnosis not present

## 2019-01-21 DIAGNOSIS — D485 Neoplasm of uncertain behavior of skin: Secondary | ICD-10-CM | POA: Diagnosis not present

## 2019-01-21 DIAGNOSIS — D225 Melanocytic nevi of trunk: Secondary | ICD-10-CM | POA: Diagnosis not present

## 2019-01-21 DIAGNOSIS — L820 Inflamed seborrheic keratosis: Secondary | ICD-10-CM | POA: Diagnosis not present

## 2019-01-23 ENCOUNTER — Ambulatory Visit: Payer: Medicare Other | Admitting: Nurse Practitioner

## 2019-02-10 ENCOUNTER — Other Ambulatory Visit: Payer: Self-pay

## 2019-02-10 ENCOUNTER — Encounter: Payer: Self-pay | Admitting: Family Medicine

## 2019-02-10 ENCOUNTER — Ambulatory Visit (INDEPENDENT_AMBULATORY_CARE_PROVIDER_SITE_OTHER): Payer: Medicare Other | Admitting: Family Medicine

## 2019-02-10 VITALS — BP 140/94 | Temp 97.0°F | Wt 158.8 lb

## 2019-02-10 DIAGNOSIS — R7303 Prediabetes: Secondary | ICD-10-CM

## 2019-02-10 DIAGNOSIS — I10 Essential (primary) hypertension: Secondary | ICD-10-CM

## 2019-02-10 DIAGNOSIS — E782 Mixed hyperlipidemia: Secondary | ICD-10-CM | POA: Diagnosis not present

## 2019-02-10 DIAGNOSIS — Z1329 Encounter for screening for other suspected endocrine disorder: Secondary | ICD-10-CM

## 2019-02-10 DIAGNOSIS — Z23 Encounter for immunization: Secondary | ICD-10-CM | POA: Diagnosis not present

## 2019-02-10 DIAGNOSIS — J4540 Moderate persistent asthma, uncomplicated: Secondary | ICD-10-CM

## 2019-02-10 LAB — POCT GLYCOSYLATED HEMOGLOBIN (HGB A1C): Hemoglobin A1C: 4.9 % (ref 4.0–5.6)

## 2019-02-10 MED ORDER — ALPRAZOLAM 0.5 MG PO TABS: 0.5000 mg | ORAL_TABLET | Freq: Two times a day (BID) | ORAL | 5 refills | Status: DC | PRN

## 2019-02-10 MED ORDER — FAMOTIDINE 40 MG PO TABS: 40.0000 mg | ORAL_TABLET | Freq: Every day | ORAL | 1 refills | Status: DC

## 2019-02-10 MED ORDER — ATORVASTATIN CALCIUM 40 MG PO TABS: 40.0000 mg | ORAL_TABLET | Freq: Every day | ORAL | 1 refills | Status: DC

## 2019-02-10 MED ORDER — ENALAPRIL MALEATE 10 MG PO TABS: 10.0000 mg | ORAL_TABLET | Freq: Every day | ORAL | 1 refills | Status: DC

## 2019-02-10 NOTE — Progress Notes (Signed)
Subjective:    Patient ID: Breanna Mason, female    DOB: [birth year], 73 y.o.   MRN: [id number]  HPI Pt states her sugars have been fluctuating for a few months. Pt states she does not eat bread or sweets. Pt uses Splenda in coffee. Pt states she has not changed any medications. Pt has been checking sugars every morning.   Results for orders placed or performed in visit on 02/10/19  Lipid Profile  Result Value Ref Range   Cholesterol, Total 156 100 - 199 mg/dL   Triglycerides 111 0 - 149 mg/dL   HDL 50 >39 mg/dL   VLDL Cholesterol Cal 20 5 - 40 mg/dL   LDL Chol Calc (NIH) 86 0 - 99 mg/dL   Chol/HDL Ratio 3.1 0.0 - 4.4 ratio  Hepatic function panel  Result Value Ref Range   Total Protein 6.9 6.0 - 8.5 g/dL   Albumin 4.5 3.7 - 4.7 g/dL   Bilirubin Total 0.7 0.0 - 1.2 mg/dL   Bilirubin, Direct 0.18 0.00 - 0.40 mg/dL   Alkaline Phosphatase 66 39 - 117 IU/L   AST 28 0 - 40 IU/L   ALT 42 (H) 0 - 32 IU/L  Basic Metabolic Panel (BMET)  Result Value Ref Range   Glucose 112 (H) 65 - 99 mg/dL   BUN 17 8 - 27 mg/dL   Creatinine, Ser 0.71 0.57 - 1.00 mg/dL   GFR calc non Af Amer 85 >59 mL/min/1.73   GFR calc Af Amer 98 >59 mL/min/1.73   BUN/Creatinine Ratio 24 12 - 28   Sodium 140 134 - 144 mmol/L   Potassium 4.4 3.5 - 5.2 mmol/L   Chloride 104 96 - 106 mmol/L   CO2 21 20 - 29 mmol/L   Calcium 9.4 8.7 - 10.3 mg/dL  CBC with Differential  Result Value Ref Range   WBC 4.8 3.4 - 10.8 x10E3/uL   RBC 4.64 3.77 - 5.28 x10E6/uL   Hemoglobin 14.5 11.1 - 15.9 g/dL   Hematocrit 42.7 34.0 - 46.6 %   MCV 92 79 - 97 fL   MCH 31.3 26.6 - 33.0 pg   MCHC 34.0 31.5 - 35.7 g/dL   RDW 12.9 11.7 - 15.4 %   Platelets 242 150 - 450 x10E3/uL   Neutrophils 54 Not Estab. %   Lymphs 25 Not Estab. %   Monocytes 17 Not Estab. %   Eos 3 Not Estab. %   Basos 1 Not Estab. %   Neutrophils Absolute 2.6 1.4 - 7.0 x10E3/uL   Lymphocytes Absolute 1.2 0.7 - 3.1 x10E3/uL   Monocytes Absolute 0.8 0.1 - 0.9  x10E3/uL   EOS (ABSOLUTE) 0.1 0.0 - 0.4 x10E3/uL   Basophils Absolute 0.0 0.0 - 0.2 x10E3/uL   Immature Granulocytes 0 Not Estab. %   Immature Grans (Abs) 0.0 0.0 - 0.1 x10E3/uL  TSH  Result Value Ref Range   TSH 2.320 0.450 - 4.500 uIU/mL  POCT HgB A1C  Result Value Ref Range   Hemoglobin A1C 4.9 4.0 - 5.6 %   HbA1c POC (<> result, manual entry)     HbA1c, POC (prediabetic range)     HbA1c, POC (controlled diabetic range)     Blood pressure medicine and blood pressure levels reviewed today with patient. Compliant with blood pressure medicine. States does not miss a dose. No obvious side effects. Blood pressure generally good when checked elsewhere. Watching salt intake.   Patient continues to take lipid medication regularly. No obvious side  effects from it. Generally does not miss a dose. Prior blood work results are reviewed with patient. Patient continues to work on fat intake in diet  Chronic asthma overall stable on current medications.  Occasional use of inhaler Review of Systems No headache, no major weight loss or weight gain, no chest pain no back pain abdominal pain no change in bowel habits complete ROS otherwise negative     Objective:   Physical Exam  Alert and oriented, vitals reviewed and stable, NAD ENT-TM's and ext canals WNL bilat via otoscopic exam Soft palate, tonsils and post pharynx WNL via oropharyngeal exam Neck-symmetric, no masses; thyroid nonpalpable and nontender Pulmonary-no tachypnea or accessory muscle use; Clear without wheezes via auscultation Card--no abnrml murmurs, rhythm reg and rate WNL Carotid pulses symmetric, without bruits        Assessment & Plan:  Impression prediabetes.  Patient concerned about sugars rising in recent weeks.  However overall his A1c is still excellent discussed  2.  Hyperlipidemia.  Status uncertain check appropriate blood work compliance discussed medication maintain  3.  Asthma clinically stable rare use of  rescue inhaler discussed  4.  Hypertension good control discussed to maintain same  Appropriate blood work.  Flu shot today.  No intervention as far as sugars continue efforts follow-up in 6 months

## 2019-02-11 DIAGNOSIS — Z1329 Encounter for screening for other suspected endocrine disorder: Secondary | ICD-10-CM | POA: Diagnosis not present

## 2019-02-11 DIAGNOSIS — I10 Essential (primary) hypertension: Secondary | ICD-10-CM | POA: Diagnosis not present

## 2019-02-11 DIAGNOSIS — E782 Mixed hyperlipidemia: Secondary | ICD-10-CM | POA: Diagnosis not present

## 2019-02-12 LAB — CBC WITH DIFFERENTIAL/PLATELET
Basophils Absolute: 0 10*3/uL (ref 0.0–0.2)
Basos: 1 %
EOS (ABSOLUTE): 0.1 10*3/uL (ref 0.0–0.4)
Eos: 3 %
Hematocrit: 42.7 % (ref 34.0–46.6)
Hemoglobin: 14.5 g/dL (ref 11.1–15.9)
Immature Grans (Abs): 0 10*3/uL (ref 0.0–0.1)
Immature Granulocytes: 0 %
Lymphocytes Absolute: 1.2 10*3/uL (ref 0.7–3.1)
Lymphs: 25 %
MCH: 31.3 pg (ref 26.6–33.0)
MCHC: 34 g/dL (ref 31.5–35.7)
MCV: 92 fL (ref 79–97)
Monocytes Absolute: 0.8 10*3/uL (ref 0.1–0.9)
Monocytes: 17 %
Neutrophils Absolute: 2.6 10*3/uL (ref 1.4–7.0)
Neutrophils: 54 %
Platelets: 242 10*3/uL (ref 150–450)
RBC: 4.64 x10E6/uL (ref 3.77–5.28)
RDW: 12.9 % (ref 11.7–15.4)
WBC: 4.8 10*3/uL (ref 3.4–10.8)

## 2019-02-12 LAB — LIPID PANEL
Chol/HDL Ratio: 3.1 ratio (ref 0.0–4.4)
Cholesterol, Total: 156 mg/dL (ref 100–199)
HDL: 50 mg/dL (ref >39)
LDL Chol Calc (NIH): 86 mg/dL (ref 0–99)
Triglycerides: 111 mg/dL (ref 0–149)
VLDL Cholesterol Cal: 20 mg/dL (ref 5–40)

## 2019-02-12 LAB — BASIC METABOLIC PANEL
BUN/Creatinine Ratio: 24 (ref 12–28)
BUN: 17 mg/dL (ref 8–27)
CO2: 21 mmol/L (ref 20–29)
Calcium: 9.4 mg/dL (ref 8.7–10.3)
Chloride: 104 mmol/L (ref 96–106)
Creatinine, Ser: 0.71 mg/dL (ref 0.57–1.00)
GFR calc Af Amer: 98 mL/min/{1.73_m2} (ref >59)
GFR calc non Af Amer: 85 mL/min/{1.73_m2} (ref >59)
Glucose: 112 mg/dL — ABNORMAL HIGH (ref 65–99)
Potassium: 4.4 mmol/L (ref 3.5–5.2)
Sodium: 140 mmol/L (ref 134–144)

## 2019-02-12 LAB — TSH: TSH: 2.32 u[IU]/mL (ref 0.450–4.500)

## 2019-02-12 LAB — HEPATIC FUNCTION PANEL
ALT: 42 IU/L — ABNORMAL HIGH (ref 0–32)
AST: 28 IU/L (ref 0–40)
Albumin: 4.5 g/dL (ref 3.7–4.7)
Alkaline Phosphatase: 66 IU/L (ref 39–117)
Bilirubin Total: 0.7 mg/dL (ref 0.0–1.2)
Bilirubin, Direct: 0.18 mg/dL (ref 0.00–0.40)
Total Protein: 6.9 g/dL (ref 6.0–8.5)

## 2019-02-13 ENCOUNTER — Other Ambulatory Visit: Payer: Self-pay | Admitting: Family Medicine

## 2019-02-16 ENCOUNTER — Encounter: Payer: Self-pay | Admitting: Family Medicine

## 2019-02-20 ENCOUNTER — Ambulatory Visit (HOSPITAL_COMMUNITY)
Admission: RE | Admit: 2019-02-20 | Discharge: 2019-02-20 | Disposition: A | Discharge status: Home / Self Care | Payer: Medicare Other | Source: Ambulatory Visit | Attending: Obstetrics and Gynecology | Admitting: Obstetrics and Gynecology

## 2019-02-20 ENCOUNTER — Other Ambulatory Visit: Payer: Self-pay

## 2019-02-20 DIAGNOSIS — Z1231 Encounter for screening mammogram for malignant neoplasm of breast: Secondary | ICD-10-CM | POA: Diagnosis not present

## 2019-02-23 ENCOUNTER — Telehealth: Payer: Self-pay | Admitting: Family Medicine

## 2019-02-23 NOTE — Telephone Encounter (Signed)
Went on trip on Monday and came back on Thursday. Was around nephew for 3 days on that trip who tested positive for covid. They did wear their mask while on the trip. Went over the signs to watch for and if having any to go ED. Not having any symptoms. Feels good. Told pt symptoms could show up 2 -14 after exposure. Pt wants to know if she should get tested and if she should quarantine.

## 2019-02-23 NOTE — Telephone Encounter (Signed)
Yes, test tom. Quarantine for 14 d following last day with nephew

## 2019-02-23 NOTE — Telephone Encounter (Signed)
Discussed with pt. Pt verbalized understanding.  °

## 2019-02-23 NOTE — Telephone Encounter (Signed)
Went on a family trip and nephew was sick and has tested positive for Covid.  They were around him at least 3 days while he was sick and wants advice on what to watch for or do.

## 2019-02-24 ENCOUNTER — Other Ambulatory Visit: Payer: Self-pay

## 2019-02-24 DIAGNOSIS — Z20822 Contact with and (suspected) exposure to covid-19: Secondary | ICD-10-CM

## 2019-02-25 LAB — NOVEL CORONAVIRUS, NAA: SARS-CoV-2, NAA: DETECTED — AB

## 2019-02-26 ENCOUNTER — Telehealth: Payer: Self-pay | Admitting: Family Medicine

## 2019-02-26 ENCOUNTER — Encounter: Payer: Self-pay | Admitting: Family Medicine

## 2019-02-26 ENCOUNTER — Ambulatory Visit (INDEPENDENT_AMBULATORY_CARE_PROVIDER_SITE_OTHER): Payer: Medicare Other | Admitting: Family Medicine

## 2019-02-26 DIAGNOSIS — U071 COVID-19: Secondary | ICD-10-CM | POA: Diagnosis not present

## 2019-02-26 NOTE — Progress Notes (Signed)
   Subjective:  Audio only  Patient ID: Breanna Mason, female    DOB: Nov 19, 1945, 73 y.o.   MRN: [id number]  HPI  Patient calls to discuss Covid results. Patient states she doesn't feel bad at all which is why she is confused.  Was around nephew at [facility] who just found out he is also positive.  Virtual Visit via Video Note  I connected with Maebel Tegethoff Horger on 02/26/19 at 11:30 AM EDT by a video enabled telemedicine application and verified that I am speaking with the correct person using two identifiers.  Location: Patient: home Provider: office   I discussed the limitations of evaluation and management by telemedicine and the availability of in person appointments. The patient expressed understanding and agreed to proceed.  History of Present Illness:    Observations/Objective:   Assessment and Plan:   Follow Up Instructions:    I discussed the assessment and treatment plan with the patient. The patient was provided an opportunity to ask questions and all were answered. The patient agreed with the plan and demonstrated an understanding of the instructions.   The patient was advised to call back or seek an in-person evaluation if the symptoms worsen or if the condition fails to improve as anticipated.  I provided 25 minutes of non-face-to-face time during this encounter.   Patient had a COVID-19 test.  This is positive.  Unfortunately she and her [place]  They all got separate cabins but unfortunately decided to have the meals together under 1 growth.  She was exposed to a nephew had been exposed to a positive patient.  Midway through his visit.  The nephew became sick.  Patient states now feels okay.  Perhaps slight nasal-otherwise feels fine.  She is not sure whether to trust the Covid test or not     Review of Systems No headache, no major weight loss or weight gain, no chest pain no back pain abdominal  pain no change in bowel habits complete ROS otherwise negative     Objective:   Physical Exam   Virtual     Assessment & Plan:  Impression COVID-19 infection.  Thus far asymptomatic.  High reliability of positives discussed.  Warning signs discussed carefully.  Concerns regarding husband discussed.  Need for isolation for her and quarantine for her husband reviewed.  Warning signs discussed.  Numerous questions answered  Greater than 50% of this 25 minute non-face to face visit was spent in counseling and discussion and coordination of care regarding the above diagnosis/diagnosies

## 2019-02-26 NOTE — Telephone Encounter (Signed)
Doesn't understand Covid test results, said that hers says "detected" and husbands says "not detected".  I did not tell her that meant she was positive.   (Had recent family trip with covid + realative)

## 2019-02-27 ENCOUNTER — Other Ambulatory Visit: Payer: Self-pay

## 2019-02-27 MED ORDER — MECLIZINE HCL 25 MG PO TABS: ORAL_TABLET | ORAL | 0 refills | Status: DC

## 2019-03-19 ENCOUNTER — Other Ambulatory Visit: Payer: Self-pay | Admitting: *Deleted

## 2019-03-19 MED ORDER — ALENDRONATE SODIUM 70 MG PO TABS: ORAL_TABLET | ORAL | 0 refills | Status: DC

## 2019-03-19 MED ORDER — ATORVASTATIN CALCIUM 40 MG PO TABS: 40.0000 mg | ORAL_TABLET | Freq: Every day | ORAL | 0 refills | Status: DC

## 2019-03-19 MED ORDER — ENALAPRIL MALEATE 10 MG PO TABS: 10.0000 mg | ORAL_TABLET | Freq: Every day | ORAL | 0 refills | Status: DC

## 2019-03-19 MED ORDER — BUDESONIDE-FORMOTEROL FUMARATE 160-4.5 MCG/ACT IN AERO: 1.0000 | INHALATION_SPRAY | Freq: Two times a day (BID) | RESPIRATORY_TRACT | 0 refills | Status: DC | PRN

## 2019-03-23 DIAGNOSIS — Z961 Presence of intraocular lens: Secondary | ICD-10-CM | POA: Diagnosis not present

## 2019-03-23 DIAGNOSIS — H50011 Monocular esotropia, right eye: Secondary | ICD-10-CM | POA: Diagnosis not present

## 2019-03-23 DIAGNOSIS — H26492 Other secondary cataract, left eye: Secondary | ICD-10-CM | POA: Diagnosis not present

## 2019-04-13 ENCOUNTER — Telehealth: Payer: Self-pay | Admitting: Family Medicine

## 2019-04-13 MED ORDER — BUDESONIDE-FORMOTEROL FUMARATE 160-4.5 MCG/ACT IN AERO: 1.0000 | INHALATION_SPRAY | Freq: Two times a day (BID) | RESPIRATORY_TRACT | 1 refills | Status: DC | PRN

## 2019-04-13 NOTE — Telephone Encounter (Signed)
Ok six ref 

## 2019-04-13 NOTE — Telephone Encounter (Signed)
Prescription sent electronically to pharmacy. 

## 2019-04-13 NOTE — Telephone Encounter (Signed)
Pharmacy requesting refill on Symbicort inhaler. Inhale one puff into lungs BID prn. Pt last saw 02/26/2019 for COVID. Please advise. Thank you

## 2019-04-13 NOTE — Addendum Note (Signed)
Addended by: Dairl Ponder on: 04/13/2019 01:26 PM   Modules accepted: Orders

## 2019-04-16 ENCOUNTER — Other Ambulatory Visit: Payer: Self-pay | Admitting: Family Medicine

## 2019-04-16 NOTE — Telephone Encounter (Signed)
Ok, plz change to directions per pharm with six ref

## 2019-04-19 ENCOUNTER — Other Ambulatory Visit: Payer: Self-pay | Admitting: Family Medicine

## 2019-05-15 ENCOUNTER — Other Ambulatory Visit: Payer: Self-pay | Admitting: Family Medicine

## 2019-05-19 ENCOUNTER — Telehealth: Payer: Self-pay | Admitting: Family Medicine

## 2019-05-19 ENCOUNTER — Other Ambulatory Visit: Payer: Self-pay | Admitting: *Deleted

## 2019-05-19 MED ORDER — ALENDRONATE SODIUM 70 MG PO TABS: ORAL_TABLET | ORAL | 0 refills | Status: DC

## 2019-05-19 MED ORDER — ATORVASTATIN CALCIUM 40 MG PO TABS: 40.0000 mg | ORAL_TABLET | Freq: Every day | ORAL | 0 refills | Status: DC

## 2019-05-19 MED ORDER — FAMOTIDINE 40 MG PO TABS: 40.0000 mg | ORAL_TABLET | Freq: Every day | ORAL | 0 refills | Status: DC

## 2019-05-19 MED ORDER — BUDESONIDE-FORMOTEROL FUMARATE 160-4.5 MCG/ACT IN AERO: INHALATION_SPRAY | RESPIRATORY_TRACT | 5 refills | Status: DC

## 2019-05-19 MED ORDER — ENALAPRIL MALEATE 10 MG PO TABS: 10.0000 mg | ORAL_TABLET | Freq: Every day | ORAL | 0 refills | Status: DC

## 2019-05-19 NOTE — Telephone Encounter (Signed)
Pt.notified

## 2019-05-19 NOTE — Telephone Encounter (Signed)
Pt requesting to have all her medicines transferred to [facility]  Please advise & call pt when done

## 2019-05-19 NOTE — Telephone Encounter (Signed)
Last med check October so ok to send in 90 day supply per dr Richardson Landry. Meds sent to pharm. Left message to return call to notify pt.

## 2019-05-26 ENCOUNTER — Ambulatory Visit (INDEPENDENT_AMBULATORY_CARE_PROVIDER_SITE_OTHER): Payer: Medicare Other | Admitting: Obstetrics and Gynecology

## 2019-05-26 ENCOUNTER — Encounter: Payer: Self-pay | Admitting: Obstetrics and Gynecology

## 2019-05-26 ENCOUNTER — Other Ambulatory Visit (HOSPITAL_COMMUNITY)
Admission: RE | Admit: 2019-05-26 | Discharge: 2019-05-26 | Disposition: A | Discharge status: Home / Self Care | Payer: Medicare Other | Source: Ambulatory Visit | Attending: Obstetrics and Gynecology | Admitting: Obstetrics and Gynecology

## 2019-05-26 ENCOUNTER — Other Ambulatory Visit: Payer: Self-pay

## 2019-05-26 VITALS — BP 139/84 | HR 94 | Ht 63.0 in | Wt 160.6 lb

## 2019-05-26 DIAGNOSIS — Z01419 Encounter for gynecological examination (general) (routine) without abnormal findings: Secondary | ICD-10-CM | POA: Insufficient documentation

## 2019-05-26 DIAGNOSIS — Z1151 Encounter for screening for human papillomavirus (HPV): Secondary | ICD-10-CM | POA: Diagnosis not present

## 2019-05-26 NOTE — Progress Notes (Addendum)
Assessment:  Annual Gyn Exam Gyn exams anticipated except on as needed basis, or 5 yrs if pt desires. Plan:  1. pap smear done, next pap due 5 yrs per [facility] 2. return  prn 3    Annual mammogram advised after age 74 Subjective:  Breanna Mason is a 74 y.o. female G1P1 who presents for annual exam. No LMP recorded. Patient is postmenopausal. The patient has complaints today of none  The following portions of the patient's history were reviewed and updated as appropriate: allergies, current medications, past family history, past medical history, past social history, past surgical history and problem list. Past Medical History:  Diagnosis Date  . Allergy   . Anxiety   . Arthritis   . Asthma   . Essential hypertension, benign   . GERD (gastroesophageal reflux disease)   . IFG (impaired fasting glucose)   . Impaired glucose tolerance   . Mixed hyperlipidemia   . Osteopenia   . Osteoporosis   . Pneumonia    hx  . PONV (postoperative nausea and vomiting)   . Reflux     Past Surgical History:  Procedure Laterality Date  . Bilateral tubal ligation    . CARPAL TUNNEL RELEASE Right    30 yrs  . CATARACT EXTRACTION W/PHACO Right 06/17/2017   Procedure: CATARACT EXTRACTION PHACO AND INTRAOCULAR LENS PLACEMENT RIGHT EYE;  Surgeon: Tonny Branch, MD;  Location: [facility];  Service: Ophthalmology;  Laterality: Right;  CDE: 17.57  . CATARACT EXTRACTION W/PHACO Left 07/01/2017   Procedure: CATARACT EXTRACTION PHACO AND INTRAOCULAR LENS PLACEMENT (IOC);  Surgeon: Tonny Branch, MD;  Location: [facility];  Service: Ophthalmology;  Laterality: Left;  CDE: 17.35  . CESAREAN SECTION    . COLONOSCOPY N/A 09/17/2013   Procedure: COLONOSCOPY;  Surgeon: Rogene Houston, MD;  [facility];  Service: Endoscopy;  Laterality: N/A;  1200  . COLONOSCOPY N/A 09/16/2017   Procedure: COLONOSCOPY;  Surgeon: Rogene Houston, MD;  [facility];  Service: Endoscopy;  Laterality: N/A;  8:55  .  REVERSE SHOULDER ARTHROPLASTY Right 03/11/2014   Procedure: RIGHT REVERSE SHOULDER ARTHROPLASTY;  Surgeon: Marin Shutter, MD;  Location: [facility];  Service: Orthopedics;  Laterality: Right;  . SHOULDER SURGERY Right    x4  . TUBAL LIGATION       Current Outpatient Medications:  .  acetaminophen (TYLENOL) 500 MG tablet, Take 500 mg by mouth daily as needed for moderate pain or headache., Disp: , Rfl:  .  albuterol (PROVENTIL HFA;VENTOLIN HFA) 108 (90 Base) MCG/ACT inhaler, INHALE 2 PUFFS BY MOUTH EVERY 6 HOURS AS NEEDED FOR WHEEZING, Disp: 18 g, Rfl: 0 .  alendronate (FOSAMAX) 70 MG tablet, TAKE 1 TABLET ONCE A WEEK WITH A FULL GLASS OF WATER ON AN EMPTY STOMACH, Disp: 12 tablet, Rfl: 0 .  ALPRAZolam (XANAX) 0.5 MG tablet, Take 1 tablet (0.5 mg total) by mouth 2 (two) times daily as needed for anxiety. for anxiety, Disp: 30 tablet, Rfl: 5 .  atorvastatin (LIPITOR) 40 MG tablet, Take 1 tablet (40 mg total) by mouth daily., Disp: 90 tablet, Rfl: 0 .  budesonide-formoterol (SYMBICORT) 160-4.5 MCG/ACT inhaler, Inhale 2 puffs into lungs BID, Disp: 1 Inhaler, Rfl: 5 .  calcium-vitamin D (OSCAL WITH D) 500-200 MG-UNIT per tablet, Take 1 tablet by mouth once a week. , Disp: , Rfl:  .  docusate sodium (COLACE) 50 MG capsule, Take 50 mg by mouth daily as needed for mild constipation. , Disp: , Rfl:  .  enalapril (VASOTEC) 10 MG tablet, Take 1 tablet (10 mg total) by mouth at bedtime., Disp: 90 tablet, Rfl: 0 .  famotidine (PEPCID) 40 MG tablet, Take 1 tablet (40 mg total) by mouth daily. Prn acid reflux, Disp: 90 tablet, Rfl: 0 .  meclizine (ANTIVERT) 25 MG tablet, TAKE 1 TABLET BY MOUTH TWICE DAILY AS NEEDED FOR DIZZINESS (Patient not taking: Reported on 05/26/2019), Disp: 90 tablet, Rfl: 0 .  ondansetron (ZOFRAN-ODT) 8 MG disintegrating tablet, Take 1 tablet (8 mg total) by mouth every 8 (eight) hours as needed for nausea or vomiting. (Patient not taking: Reported on 05/26/2019), Disp: 30 tablet, Rfl:  0  Review of Systems Constitutional: negative Gastrointestinal: negative Genitourinary: None  Objective:  BP 139/84 (BP Location: Left Arm, Patient Position: Sitting, Cuff Size: Normal)   Pulse 94   Ht 5\' 3"  (1.6 m)   Wt 160 lb 9.6 oz (72.8 kg)   BMI 28.45 kg/m    BMI: Body mass index is 28.45 kg/m.  General Appearance: Alert, appropriate appearance for age. No acute distress HEENT: Grossly normal Neck / Thyroid:  Cardiovascular: RRR; normal S1, S2, no murmur Lungs: CTA bilaterally Back: No CVAT Breast Exam: No dimpling, nipple retraction or discharge. No masses or nodes., Normal to inspection, Normal breast tissue bilaterally and No masses or nodes.No dimpling, nipple retraction or discharge. Gastrointestinal: Soft, non-tender, no masses or organomegaly Pelvic Exam:  Vulva and vagina appear normal. Bimanual exam reveals normal uterus and adnexa. External genitalia: normal general appearance Vaginal: normal mucosa without prolapse or lesions Cervix: normal appearance and thin prep PAP obtained Adnexa: normal bimanual exam  Rectovaginal: normal rectal, no masses and guaiac negative stool obtained Lymphatic Exam: Non-palpable nodes in neck, clavicular, axillary, or inguinal regions Skin: no rash or abnormalities Neurologic: Normal gait and speech, no tremor  Psychiatric: Alert and oriented, appropriate affect.  Urinalysis:Not done  Mallory Shirk. MD Pgr [phone] 5:17 PM

## 2019-05-27 ENCOUNTER — Telehealth: Payer: Self-pay | Admitting: Family Medicine

## 2019-05-27 LAB — HEMOCCULT GUIAC POC 1CARD (OFFICE): Fecal Occult Blood, POC: NEGATIVE

## 2019-05-27 NOTE — Telephone Encounter (Signed)
Patient is wanting to know if she needs lab for 6 month follow up 4/7

## 2019-05-27 NOTE — Telephone Encounter (Signed)
Last labs 02/2019: Lipid, Liver, Met 7, CBC, TSH, HgbA1c

## 2019-05-31 LAB — CYTOLOGY - PAP
Comment: NEGATIVE
Diagnosis: NEGATIVE
High risk HPV: NEGATIVE

## 2019-06-02 ENCOUNTER — Telehealth: Payer: Self-pay | Admitting: Family Medicine

## 2019-06-02 DIAGNOSIS — Z79899 Other long term (current) drug therapy: Secondary | ICD-10-CM

## 2019-06-02 DIAGNOSIS — E782 Mixed hyperlipidemia: Secondary | ICD-10-CM

## 2019-06-02 DIAGNOSIS — I10 Essential (primary) hypertension: Secondary | ICD-10-CM

## 2019-06-02 NOTE — Telephone Encounter (Signed)
Pt has virtual in April and would like lab work done.

## 2019-06-02 NOTE — Telephone Encounter (Signed)
Lip liv

## 2019-06-02 NOTE — Telephone Encounter (Signed)
Last labs completed on 02/11/2019 TSH, CBC, BMET, Hepatic and Lipid. Please advise. Thank you

## 2019-06-02 NOTE — Telephone Encounter (Signed)
Lab orders placed; left message to return call  

## 2019-06-03 NOTE — Telephone Encounter (Signed)
Pt returned call; nurse was with nurse visit. Called patient back and informed that labs were placed. Pt verbalized understanding.

## 2019-06-11 ENCOUNTER — Encounter: Payer: Self-pay | Admitting: Family Medicine

## 2019-06-16 NOTE — Telephone Encounter (Signed)
Please schedule for an u/s.

## 2019-06-29 ENCOUNTER — Other Ambulatory Visit: Payer: Self-pay | Admitting: Obstetrics and Gynecology

## 2019-06-29 DIAGNOSIS — N950 Postmenopausal bleeding: Secondary | ICD-10-CM

## 2019-06-30 ENCOUNTER — Ambulatory Visit (INDEPENDENT_AMBULATORY_CARE_PROVIDER_SITE_OTHER): Payer: Medicare Other

## 2019-06-30 ENCOUNTER — Other Ambulatory Visit: Payer: Self-pay

## 2019-06-30 DIAGNOSIS — N950 Postmenopausal bleeding: Secondary | ICD-10-CM | POA: Diagnosis not present

## 2019-06-30 NOTE — Progress Notes (Signed)
PELVIC US TA/TV:heterogeneous retroverted uterus, 1.6 x .7 x 1.4 cm echogenic anterior fundal mass (? Lipoleiomyoma),complex thickened endometrium with mult small cysts 19.9 mm,color flow with in the endometrium,simple right ovarian cyst 2.1 x 1.9 x 2.1 cm, left ovary wnl (limited view)

## 2019-07-01 ENCOUNTER — Ambulatory Visit (INDEPENDENT_AMBULATORY_CARE_PROVIDER_SITE_OTHER): Payer: Medicare Other | Admitting: Obstetrics and Gynecology

## 2019-07-01 ENCOUNTER — Other Ambulatory Visit: Payer: Self-pay

## 2019-07-01 ENCOUNTER — Encounter: Payer: Self-pay | Admitting: Obstetrics and Gynecology

## 2019-07-01 VITALS — BP 135/77 | HR 104 | Ht 63.0 in | Wt 160.0 lb

## 2019-07-01 DIAGNOSIS — N840 Polyp of corpus uteri: Secondary | ICD-10-CM | POA: Diagnosis not present

## 2019-07-01 NOTE — Progress Notes (Addendum)
Patient ID: JOAL ZUPKO, female   DOB: [birth year], 74 y.o.   MRN: [id number]    [place] [facility] Visit  @DATE @            Patient name: Breanna Mason MRN [id number]  Date of birth: 1945/06/01  CC & HPI:  Breanna Mason is a 74 y.o. female presenting today following pelvic/transvaginal ultrasound performed yesterday. Results showed a large mass in the endometrial cavity, 4 cm in length by 1.3 cm in width with color flow in the mass, and multiple internal cysts, most consistent with a large polyp. Surgical excision was recommended. No complaints at this time.  ROS:  ROS  All systems are negative except as noted in the HPI and PMH.   Pertinent History Reviewed:   Reviewed  Medical         Past Medical History:  Diagnosis Date  . Allergy   . Anxiety   . Arthritis   . Asthma   . Essential hypertension, benign   . GERD (gastroesophageal reflux disease)   . IFG (impaired fasting glucose)   . Impaired glucose tolerance   . Mixed hyperlipidemia   . Osteopenia   . Osteoporosis   . Pneumonia    hx  . PONV (postoperative nausea and vomiting)   . Reflux                               Surgical Hx:    Past Surgical History:  Procedure Laterality Date  . Bilateral tubal ligation    . CARPAL TUNNEL RELEASE Right    30 yrs  . CATARACT EXTRACTION W/PHACO Right 06/17/2017   Procedure: CATARACT EXTRACTION PHACO AND INTRAOCULAR LENS PLACEMENT RIGHT EYE;  Surgeon: Tonny Branch, MD;  Location: [facility];  Service: Ophthalmology;  Laterality: Right;  CDE: 17.57  . CATARACT EXTRACTION W/PHACO Left 07/01/2017   Procedure: CATARACT EXTRACTION PHACO AND INTRAOCULAR LENS PLACEMENT (IOC);  Surgeon: Tonny Branch, MD;  Location: [facility];  Service: Ophthalmology;  Laterality: Left;  CDE: 17.35  . CESAREAN SECTION    . COLONOSCOPY N/A 09/17/2013   Procedure: COLONOSCOPY;  Surgeon: Rogene Houston, MD;  [facility];  Service: Endoscopy;  Laterality: N/A;  1200  . COLONOSCOPY N/A  09/16/2017   Procedure: COLONOSCOPY;  Surgeon: Rogene Houston, MD;  [facility];  Service: Endoscopy;  Laterality: N/A;  8:55  . REVERSE SHOULDER ARTHROPLASTY Right 03/11/2014   Procedure: RIGHT REVERSE SHOULDER ARTHROPLASTY;  Surgeon: Marin Shutter, MD;  Location: [facility];  Service: Orthopedics;  Laterality: Right;  . SHOULDER SURGERY Right    x4  . TUBAL LIGATION     Medications: Reviewed & Updated - see associated section                       Current Outpatient Medications:  .  acetaminophen (TYLENOL) 500 MG tablet, Take 500 mg by mouth daily as needed for moderate pain or headache., Disp: , Rfl:  .  alendronate (FOSAMAX) 70 MG tablet, TAKE 1 TABLET ONCE A WEEK WITH A FULL GLASS OF WATER ON AN EMPTY STOMACH, Disp: 12 tablet, Rfl: 0 .  ALPRAZolam (XANAX) 0.5 MG tablet, Take 1 tablet (0.5 mg total) by mouth 2 (two) times daily as needed for anxiety. for anxiety, Disp: 30 tablet, Rfl: 5 .  atorvastatin (LIPITOR) 40 MG tablet, Take 1 tablet (40 mg total) by mouth  daily., Disp: 90 tablet, Rfl: 0 .  budesonide-formoterol (SYMBICORT) 160-4.5 MCG/ACT inhaler, Inhale 2 puffs into lungs BID, Disp: 1 Inhaler, Rfl: 5 .  calcium-vitamin D (OSCAL WITH D) 500-200 MG-UNIT per tablet, Take 1 tablet by mouth once a week. , Disp: , Rfl:  .  docusate sodium (COLACE) 50 MG capsule, Take 50 mg by mouth daily as needed for mild constipation. , Disp: , Rfl:  .  enalapril (VASOTEC) 10 MG tablet, Take 1 tablet (10 mg total) by mouth at bedtime., Disp: 90 tablet, Rfl: 0 .  famotidine (PEPCID) 40 MG tablet, Take 1 tablet (40 mg total) by mouth daily. Prn acid reflux, Disp: 90 tablet, Rfl: 0 .  meclizine (ANTIVERT) 25 MG tablet, TAKE 1 TABLET BY MOUTH TWICE DAILY AS NEEDED FOR DIZZINESS, Disp: 90 tablet, Rfl: 0 .  albuterol (PROVENTIL HFA;VENTOLIN HFA) 108 (90 Base) MCG/ACT inhaler, INHALE 2 PUFFS BY MOUTH EVERY 6 HOURS AS NEEDED FOR WHEEZING (Patient not taking: Reported on 07/01/2019), Disp: 18 g, Rfl: 0 .   ondansetron (ZOFRAN-ODT) 8 MG disintegrating tablet, Take 1 tablet (8 mg total) by mouth every 8 (eight) hours as needed for nausea or vomiting. (Patient not taking: Reported on 05/26/2019), Disp: 30 tablet, Rfl: 0   Social History: Reviewed -  reports that she has never smoked. She has never used smokeless tobacco.  Objective Findings:  Vitals: Blood pressure 135/77, pulse (!) 104, height 5\' 3"  (1.6 m), weight 160 lb (72.6 kg).  PHYSICAL EXAMINATION General appearance - alert, well appearing, and in no distress and oriented to person, place, and time Mental status - normal mood, behavior, speech, dress, motor activity, and thought processes, affect appropriate to mood Pelvic exam completed 05/26/2019, not repeated. These are findings of that date: Vulva and vagina appear normal. Bimanual exam reveals normal uterus and adnexa. External genitalia: normal general appearance Vaginal: normal mucosa without prolapse or lesions Cervix: normal appearance and thin prep PAP obtained Adnexa: normal bimanual exam   Assessment & Plan:   A:  1. Endometrial Polyp  P:  1.  Surgical excision, plan for March by hysteroscopy, Dilation and curettage, removal of endometrial polyp.    By signing my name below, I, Clerance Lav, attest that this documentation has been prepared under the direction and in the presence of Jonnie Kind, MD. Electronically Signed: [place]. 07/01/19. 3:09 PM.  I personally performed the services described in this documentation, which was SCRIBED in my presence. The recorded information has been reviewed and considered accurate. It has been edited as necessary during review. Jonnie Kind, MD

## 2019-07-10 ENCOUNTER — Ambulatory Visit: Payer: Medicare Other | Attending: Internal Medicine

## 2019-07-10 DIAGNOSIS — Z23 Encounter for immunization: Secondary | ICD-10-CM

## 2019-07-10 NOTE — Progress Notes (Signed)
   Covid-19 Vaccination Clinic  Name:  Breanna Mason    MRN: [id number] DOB: 26-Apr-1946  07/10/2019  [name] was observed post Covid-19 immunization for 15 minutes without incident. She was provided with Vaccine Information Sheet and instruction to access the V-Safe system.   [name] was instructed to call 911 with any severe reactions post vaccine: [name] Kitchen Difficulty breathing  . Swelling of face and throat  . A fast heartbeat  . A bad rash all over body  . Dizziness and weakness   Immunizations Administered    Name Date Dose VIS Date Route   Moderna COVID-19 Vaccine 07/10/2019 11:05 AM 0.5 mL 04/07/2019 Intramuscular   Manufacturer: Moderna   [id number]   [id number]

## 2019-07-23 ENCOUNTER — Other Ambulatory Visit: Payer: Self-pay | Admitting: *Deleted

## 2019-07-23 MED ORDER — ALENDRONATE SODIUM 70 MG PO TABS: ORAL_TABLET | ORAL | 0 refills | Status: DC

## 2019-08-06 DIAGNOSIS — L905 Scar conditions and fibrosis of skin: Secondary | ICD-10-CM | POA: Diagnosis not present

## 2019-08-06 DIAGNOSIS — D485 Neoplasm of uncertain behavior of skin: Secondary | ICD-10-CM | POA: Diagnosis not present

## 2019-08-06 DIAGNOSIS — L821 Other seborrheic keratosis: Secondary | ICD-10-CM | POA: Diagnosis not present

## 2019-08-06 DIAGNOSIS — D225 Melanocytic nevi of trunk: Secondary | ICD-10-CM | POA: Diagnosis not present

## 2019-08-07 DIAGNOSIS — E782 Mixed hyperlipidemia: Secondary | ICD-10-CM | POA: Diagnosis not present

## 2019-08-07 DIAGNOSIS — Z79899 Other long term (current) drug therapy: Secondary | ICD-10-CM | POA: Diagnosis not present

## 2019-08-07 DIAGNOSIS — I10 Essential (primary) hypertension: Secondary | ICD-10-CM | POA: Diagnosis not present

## 2019-08-08 LAB — HEPATIC FUNCTION PANEL
ALT: 32 IU/L (ref 0–32)
AST: 21 IU/L (ref 0–40)
Albumin: 4.4 g/dL (ref 3.7–4.7)
Alkaline Phosphatase: 69 IU/L (ref 39–117)
Bilirubin Total: 0.5 mg/dL (ref 0.0–1.2)
Bilirubin, Direct: 0.1 mg/dL (ref 0.00–0.40)
Total Protein: 6.9 g/dL (ref 6.0–8.5)

## 2019-08-08 LAB — LIPID PANEL
Chol/HDL Ratio: 3.5 ratio (ref 0.0–4.4)
Cholesterol, Total: 190 mg/dL (ref 100–199)
HDL: 55 mg/dL (ref >39)
LDL Chol Calc (NIH): 118 mg/dL — ABNORMAL HIGH (ref 0–99)
Triglycerides: 91 mg/dL (ref 0–149)
VLDL Cholesterol Cal: 17 mg/dL (ref 5–40)

## 2019-08-11 ENCOUNTER — Ambulatory Visit: Payer: Medicare Other | Attending: Internal Medicine

## 2019-08-11 DIAGNOSIS — Z23 Encounter for immunization: Secondary | ICD-10-CM

## 2019-08-11 NOTE — Progress Notes (Signed)
   Covid-19 Vaccination Clinic  Name:  Breanna Mason    MRN: [id number] DOB: 07/24/1945  08/11/2019  Breanna Mason was observed post Covid-19 immunization for 15 minutes without incident. She was provided with Vaccine Information Sheet and instruction to access the V-Safe system.   Breanna Mason was instructed to call 911 with any severe reactions post vaccine: [name] Kitchen Difficulty breathing  . Swelling of face and throat  . A fast heartbeat  . A bad rash all over body  . Dizziness and weakness   Immunizations Administered    Name Date Dose VIS Date Route   Moderna COVID-19 Vaccine 08/11/2019  8:11 AM 0.5 mL 04/07/2019 Intramuscular   Manufacturer: Moderna   LotMV:[id number]   [id number]

## 2019-08-12 ENCOUNTER — Ambulatory Visit: Payer: Medicare Other | Admitting: Family Medicine

## 2019-08-12 ENCOUNTER — Ambulatory Visit: Payer: Medicare Other

## 2019-08-20 ENCOUNTER — Other Ambulatory Visit: Payer: Self-pay | Admitting: Obstetrics and Gynecology

## 2019-08-20 NOTE — Patient Instructions (Signed)
Breanna Mason  08/20/2019     @PREFPERIOPPHARMACY @   Your procedure is scheduled on  08/25/2019 .  Report to [facility] at  [phone]  A.M.  Call this number if you have problems the morning of surgery:  [phone]   Remember:  Do not eat or drink after midnight.                       Take these medicines the morning of surgery with A SIP OF WATER  Xanax(if needed), pepcid, antivert(if needed).    Do not wear jewelry, make-up or nail polish.  Do not wear lotions, powders, or perfumes. Please wear deodorant and brush your teeth.  Do not shave 48 hours prior to surgery.  Men may shave face and neck.  Do not bring valuables to the hospital.  [facility] is not responsible for any belongings or valuables.  Contacts, dentures or bridgework may not be worn into surgery.  Leave your suitcase in the car.  After surgery it may be brought to your room.  For patients admitted to the hospital, discharge time will be determined by your treatment team.  Patients discharged the day of surgery will not be allowed to drive home.   Name and phone number of your driver:   family Special instructions:  DO NOT smoke the morning of your procedure.  Please read over the following fact sheets that you were given. Anesthesia Post-op Instructions and Care and Recovery After Surgery       Dilation and Curettage or Vacuum Curettage, Care After These instructions give you information about caring for yourself after your procedure. Your doctor may also give you more specific instructions. Call your doctor if you have any problems or questions after your procedure. Follow these instructions at home: Activity  Do not drive or use heavy machinery while taking prescription pain medicine.  For 24 hours after your procedure, avoid driving.  Take short walks often, followed by rest periods. Ask your doctor what activities are safe for you. After one or two days, you may be able to return to  your normal activities.  Do not lift anything that is heavier than 10 lb (4.5 kg) until your doctor approves.  For at least 2 weeks, or as long as told by your doctor: ? Do not douche. ? Do not use tampons. ? Do not have sex. General instructions   Take over-the-counter and prescription medicines only as told by your doctor. This is very important if you take blood thinning medicine.  Do not take baths, swim, or use a hot tub until your doctor approves. Take showers instead of baths.  Wear compression stockings as told by your doctor.  It is up to you to get the results of your procedure. Ask your doctor when your results will be ready.  Keep all follow-up visits as told by your doctor. This is important. Contact a doctor if:  You have very bad cramps that get worse or do not get better with medicine.  You have very bad pain in your belly (abdomen).  You cannot drink fluids without throwing up (vomiting).  You get pain in a different part of the area between your belly and thighs (pelvis).  You have bad-smelling discharge from your vagina.  You have a rash. Get help right away if:  You are bleeding a lot from your vagina. A lot of bleeding means soaking more  than one sanitary pad in an hour, for 2 hours in a row.  You have clumps of blood (blood clots) coming from your vagina.  You have a fever or chills.  Your belly feels very tender or hard.  You have chest pain.  You have trouble breathing.  You cough up blood.  You feel dizzy.  You feel light-headed.  You pass out (faint).  You have pain in your neck or shoulder area. Summary  Take short walks often, followed by rest periods. Ask your doctor what activities are safe for you. After one or two days, you may be able to return to your normal activities.  Do not lift anything that is heavier than 10 lb (4.5 kg) until your doctor approves.  Do not take baths, swim, or use a hot tub until your doctor  approves. Take showers instead of baths.  Contact your doctor if you have any symptoms of infection, like bad-smelling discharge from your vagina. This information is not intended to replace advice given to you by your health care provider. Make sure you discuss any questions you have with your health care provider. Document Revised: 04/05/2017 Document Reviewed: 01/09/2016 Elsevier Patient Education  [place].  Hysteroscopy, Care After This sheet gives you information about how to care for yourself after your procedure. Your health care provider may also give you more specific instructions. If you have problems or questions, contact your health care provider. What can I expect after the procedure? After the procedure, it is common to have:  Cramping.  Bleeding. This can vary from light spotting to menstrual-like bleeding. Follow these instructions at home: Activity  Rest for 1-2 days after the procedure.  Do not douche, use tampons, or have sex for 2 weeks after the procedure, or until your health care provider approves.  Do not drive for 24 hours after the procedure, or for as long as told by your health care provider.  Do not drive, use heavy machinery, or drink alcohol while taking prescription pain medicines. Medicines   Take over-the-counter and prescription medicines only as told by your health care provider.  Do not take aspirin during recovery. It can increase the risk of bleeding. General instructions  Do not take baths, swim, or use a hot tub until your health care provider approves. Take showers instead of baths for 2 weeks, or for as long as told by your health care provider.  To prevent or treat constipation while you are taking prescription pain medicine, your health care provider may recommend that you: ? Drink enough fluid to keep your urine clear or pale yellow. ? Take over-the-counter or prescription medicines. ? Eat foods that are high in fiber,  such as fresh fruits and vegetables, whole grains, and beans. ? Limit foods that are high in fat and processed sugars, such as fried and sweet foods.  Keep all follow-up visits as told by your health care provider. This is important. Contact a health care provider if:  You feel dizzy or lightheaded.  You feel nauseous.  You have abnormal vaginal discharge.  You have a rash.  You have pain that does not get better with medicine.  You have chills. Get help right away if:  You have bleeding that is heavier than a normal menstrual period.  You have a fever.  You have pain or cramps that get worse.  You develop new abdominal pain.  You faint.  You have pain in your shoulders.  You have shortness  of breath. Summary  After the procedure, you may have cramping and some vaginal bleeding.  Do not douche, use tampons, or have sex for 2 weeks after the procedure, or until your health care provider approves.  Do not take baths, swim, or use a hot tub until your health care provider approves. Take showers instead of baths for 2 weeks, or for as long as told by your health care provider.  Report any unusual symptoms to your health care provider.  Keep all follow-up visits as told by your health care provider. This is important. This information is not intended to replace advice given to you by your health care provider. Make sure you discuss any questions you have with your health care provider. Document Revised: 04/05/2017 Document Reviewed: 05/22/2016 Elsevier Patient Education  [place]. How to Use Chlorhexidine for Bathing Chlorhexidine gluconate (CHG) is a germ-killing (antiseptic) solution that is used to clean the skin. It can get rid of the bacteria that normally live on the skin and can keep them away for about 24 hours. To clean your skin with CHG, you may be given:  A CHG solution to use in the shower or as part of a sponge bath.  A prepackaged cloth that  contains CHG. Cleaning your skin with CHG may help lower the risk for infection:  While you are staying in the intensive care unit of the hospital.  If you have a vascular access, such as a central line, to provide short-term or long-term access to your veins.  If you have a catheter to drain urine from your bladder.  If you are on a ventilator. A ventilator is a machine that helps you breathe by moving air in and out of your lungs.  After surgery. What are the risks? Risks of using CHG include:  A skin reaction.  Hearing loss, if CHG gets in your ears.  Eye injury, if CHG gets in your eyes and is not rinsed out.  The CHG product catching fire. Make sure that you avoid smoking and flames after applying CHG to your skin. Do not use CHG:  If you have a chlorhexidine allergy or have previously reacted to chlorhexidine.  On babies younger than 7 months of age. How to use CHG solution  Use CHG only as told by your health care provider, and follow the instructions on the label.  Use the full amount of CHG as directed. Usually, this is one bottle. During a shower Follow these steps when using CHG solution during a shower (unless your health care provider gives you different instructions): 1. Start the shower. 2. Use your normal soap and shampoo to wash your face and hair. 3. Turn off the shower or move out of the shower stream. 4. Pour the CHG onto a clean washcloth. Do not use any type of brush or rough-edged sponge. 5. Starting at your neck, lather your body down to your toes. Make sure you follow these instructions: ? If you will be having surgery, pay special attention to the part of your body where you will be having surgery. Scrub this area for at least 1 minute. ? Do not use CHG on your head or face. If the solution gets into your ears or eyes, rinse them well with water. ? Avoid your genital area. ? Avoid any areas of skin that have broken skin, cuts, or scrapes. ? Scrub  your back and under your arms. Make sure to wash skin folds. 6. Let the lather  sit on your skin for 1-2 minutes or as long as told by your health care provider. 7. Thoroughly rinse your entire body in the shower. Make sure that all body creases and crevices are rinsed well. 8. Dry off with a clean towel. Do not put any substances on your body afterward--such as powder, lotion, or perfume--unless you are told to do so by your health care provider. Only use lotions that are recommended by the manufacturer. 9. Put on clean clothes or pajamas. 10. If it is the night before your surgery, sleep in clean sheets.  During a sponge bath Follow these steps when using CHG solution during a sponge bath (unless your health care provider gives you different instructions): 1. Use your normal soap and shampoo to wash your face and hair. 2. Pour the CHG onto a clean washcloth. 3. Starting at your neck, lather your body down to your toes. Make sure you follow these instructions: ? If you will be having surgery, pay special attention to the part of your body where you will be having surgery. Scrub this area for at least 1 minute. ? Do not use CHG on your head or face. If the solution gets into your ears or eyes, rinse them well with water. ? Avoid your genital area. ? Avoid any areas of skin that have broken skin, cuts, or scrapes. ? Scrub your back and under your arms. Make sure to wash skin folds. 4. Let the lather sit on your skin for 1-2 minutes or as long as told by your health care provider. 5. Using a different clean, wet washcloth, thoroughly rinse your entire body. Make sure that all body creases and crevices are rinsed well. 6. Dry off with a clean towel. Do not put any substances on your body afterward--such as powder, lotion, or perfume--unless you are told to do so by your health care provider. Only use lotions that are recommended by the manufacturer. 7. Put on clean clothes or pajamas. 8. If it is  the night before your surgery, sleep in clean sheets. How to use CHG prepackaged cloths  Only use CHG cloths as told by your health care provider, and follow the instructions on the label.  Use the CHG cloth on clean, dry skin.  Do not use the CHG cloth on your head or face unless your health care provider tells you to.  When washing with the CHG cloth: ? Avoid your genital area. ? Avoid any areas of skin that have broken skin, cuts, or scrapes. Before surgery Follow these steps when using a CHG cloth to clean before surgery (unless your health care provider gives you different instructions): 1. Using the CHG cloth, vigorously scrub the part of your body where you will be having surgery. Scrub using a back-and-forth motion for 3 minutes. The area on your body should be completely wet with CHG when you are done scrubbing. 2. Do not rinse. Discard the cloth and let the area air-dry. Do not put any substances on the area afterward, such as powder, lotion, or perfume. 3. Put on clean clothes or pajamas. 4. If it is the night before your surgery, sleep in clean sheets.  For general bathing Follow these steps when using CHG cloths for general bathing (unless your health care provider gives you different instructions). 1. Use a separate CHG cloth for each area of your body. Make sure you wash between any folds of skin and between your fingers and toes. Wash your body in the  following order, switching to a new cloth after each step: ? The front of your neck, shoulders, and chest. ? Both of your arms, under your arms, and your hands. ? Your stomach and groin area, avoiding the genitals. ? Your right leg and foot. ? Your left leg and foot. ? The back of your neck, your back, and your buttocks. 2. Do not rinse. Discard the cloth and let the area air-dry. Do not put any substances on your body afterward--such as powder, lotion, or perfume--unless you are told to do so by your health care provider.  Only use lotions that are recommended by the manufacturer. 3. Put on clean clothes or pajamas. Contact a health care provider if:  Your skin gets irritated after scrubbing.  You have questions about using your solution or cloth. Get help right away if:  Your eyes become very red or swollen.  Your eyes itch badly.  Your skin itches badly and is red or swollen.  Your hearing changes.  You have trouble seeing.  You have swelling or tingling in your mouth or throat.  You have trouble breathing.  You swallow any chlorhexidine. Summary  Chlorhexidine gluconate (CHG) is a germ-killing (antiseptic) solution that is used to clean the skin. Cleaning your skin with CHG may help to lower your risk for infection.  You may be given CHG to use for bathing. It may be in a bottle or in a prepackaged cloth to use on your skin. Carefully follow your health care provider's instructions and the instructions on the product label.  Do not use CHG if you have a chlorhexidine allergy.  Contact your health care provider if your skin gets irritated after scrubbing. This information is not intended to replace advice given to you by your health care provider. Make sure you discuss any questions you have with your health care provider. Document Revised: 07/10/2018 Document Reviewed: 03/21/2017 Elsevier Patient Education  [place].

## 2019-08-21 ENCOUNTER — Encounter (HOSPITAL_COMMUNITY)
Admission: RE | Admit: 2019-08-21 | Discharge: 2019-08-21 | Disposition: A | Discharge status: Home / Self Care | Payer: Medicare Other | Source: Ambulatory Visit | Attending: Obstetrics and Gynecology | Admitting: Obstetrics and Gynecology

## 2019-08-21 ENCOUNTER — Other Ambulatory Visit (HOSPITAL_COMMUNITY)
Admission: RE | Admit: 2019-08-21 | Discharge: 2019-08-21 | Disposition: A | Discharge status: Home / Self Care | Payer: Medicare Other | Source: Ambulatory Visit | Attending: Obstetrics and Gynecology | Admitting: Obstetrics and Gynecology

## 2019-08-21 ENCOUNTER — Encounter (HOSPITAL_COMMUNITY): Payer: Self-pay

## 2019-08-21 ENCOUNTER — Other Ambulatory Visit: Payer: Self-pay

## 2019-08-21 DIAGNOSIS — Z20822 Contact with and (suspected) exposure to covid-19: Secondary | ICD-10-CM | POA: Insufficient documentation

## 2019-08-21 DIAGNOSIS — Z01812 Encounter for preprocedural laboratory examination: Secondary | ICD-10-CM | POA: Diagnosis not present

## 2019-08-21 HISTORY — DX: Unspecified asthma, uncomplicated: J45.909

## 2019-08-21 LAB — TYPE AND SCREEN
ABO/RH(D): A POS
Antibody Screen: NEGATIVE

## 2019-08-21 LAB — COMPREHENSIVE METABOLIC PANEL
ALT: 40 U/L (ref 0–44)
AST: 23 U/L (ref 15–41)
Albumin: 4.3 g/dL (ref 3.5–5.0)
Alkaline Phosphatase: 63 U/L (ref 38–126)
Anion gap: 10 (ref 5–15)
BUN: 22 mg/dL (ref 8–23)
CO2: 23 mmol/L (ref 22–32)
Calcium: 8.8 mg/dL — ABNORMAL LOW (ref 8.9–10.3)
Chloride: 105 mmol/L (ref 98–111)
Creatinine, Ser: 0.92 mg/dL (ref 0.44–1.00)
GFR calc Af Amer: >60 mL/min (ref >60)
GFR calc non Af Amer: >60 mL/min (ref >60)
Glucose, Bld: 119 mg/dL — ABNORMAL HIGH (ref 70–99)
Potassium: 3.7 mmol/L (ref 3.5–5.1)
Sodium: 138 mmol/L (ref 135–145)
Total Bilirubin: 0.6 mg/dL (ref 0.3–1.2)
Total Protein: 7 g/dL (ref 6.5–8.1)

## 2019-08-21 LAB — CBC
HCT: 42.2 % (ref 36.0–46.0)
Hemoglobin: 14.1 g/dL (ref 12.0–15.0)
MCH: 31.6 pg (ref 26.0–34.0)
MCHC: 33.4 g/dL (ref 30.0–36.0)
MCV: 94.6 fL (ref 80.0–100.0)
Platelets: 276 10*3/uL (ref 150–400)
RBC: 4.46 MIL/uL (ref 3.87–5.11)
RDW: 13.3 % (ref 11.5–15.5)
WBC: 7.9 10*3/uL (ref 4.0–10.5)
nRBC: 0 % (ref 0.0–0.2)

## 2019-08-22 LAB — SARS CORONAVIRUS 2 (TAT 6-24 HRS): SARS Coronavirus 2: NEGATIVE

## 2019-08-24 ENCOUNTER — Ambulatory Visit (INDEPENDENT_AMBULATORY_CARE_PROVIDER_SITE_OTHER): Payer: Medicare Other | Admitting: Family Medicine

## 2019-08-24 ENCOUNTER — Encounter: Payer: Self-pay | Admitting: Family Medicine

## 2019-08-24 ENCOUNTER — Other Ambulatory Visit: Payer: Self-pay

## 2019-08-24 VITALS — BP 138/86 | Temp 98.2°F | Ht 63.0 in | Wt 163.0 lb

## 2019-08-24 DIAGNOSIS — R7303 Prediabetes: Secondary | ICD-10-CM

## 2019-08-24 DIAGNOSIS — E782 Mixed hyperlipidemia: Secondary | ICD-10-CM

## 2019-08-24 DIAGNOSIS — I10 Essential (primary) hypertension: Secondary | ICD-10-CM

## 2019-08-24 DIAGNOSIS — J4540 Moderate persistent asthma, uncomplicated: Secondary | ICD-10-CM | POA: Diagnosis not present

## 2019-08-24 MED ORDER — ENALAPRIL MALEATE 10 MG PO TABS: 10.0000 mg | ORAL_TABLET | Freq: Every day | ORAL | 1 refills | Status: DC

## 2019-08-24 MED ORDER — FAMOTIDINE 40 MG PO TABS: 40.0000 mg | ORAL_TABLET | Freq: Every day | ORAL | 1 refills | Status: DC

## 2019-08-24 MED ORDER — ATORVASTATIN CALCIUM 40 MG PO TABS: 40.0000 mg | ORAL_TABLET | Freq: Every day | ORAL | 1 refills | Status: DC

## 2019-08-24 MED ORDER — BUDESONIDE-FORMOTEROL FUMARATE 160-4.5 MCG/ACT IN AERO: INHALATION_SPRAY | RESPIRATORY_TRACT | 5 refills | Status: DC

## 2019-08-24 MED ORDER — ALPRAZOLAM 0.5 MG PO TABS: 0.5000 mg | ORAL_TABLET | Freq: Two times a day (BID) | ORAL | 5 refills | Status: DC | PRN

## 2019-08-24 MED ORDER — ALENDRONATE SODIUM 70 MG PO TABS: ORAL_TABLET | ORAL | 1 refills | Status: DC

## 2019-08-24 NOTE — Progress Notes (Signed)
Subjective:    Patient ID: Breanna Mason, female    DOB: June 02, 1945, 74 y.o.   MRN: [id number]  Hypertension This is a chronic problem. Treatments tried: enalapril. There are no compliance problems (takes meds every day, eats healthy, exercises when she can).    Fingers are cold and hard to bend.  Worse in the morning.  This is been progressing for some time.  Takes xanax for anxiety. Gad 7 done.  Also takes for insomnia definitely helps  Results for orders placed or performed during the hospital encounter of 08/21/19  CBC  Result Value Ref Range   WBC 7.9 4.0 - 10.5 K/uL   RBC 4.46 3.87 - 5.11 MIL/uL   Hemoglobin 14.1 12.0 - 15.0 g/dL   HCT 42.2 36.0 - 46.0 %   MCV 94.6 80.0 - 100.0 fL   MCH 31.6 26.0 - 34.0 pg   MCHC 33.4 30.0 - 36.0 g/dL   RDW 13.3 11.5 - 15.5 %   Platelets 276 150 - 400 K/uL   nRBC 0.0 0.0 - 0.2 %  Comprehensive metabolic panel  Result Value Ref Range   Sodium 138 135 - 145 mmol/L   Potassium 3.7 3.5 - 5.1 mmol/L   Chloride 105 98 - 111 mmol/L   CO2 23 22 - 32 mmol/L   Glucose, Bld 119 (H) 70 - 99 mg/dL   BUN 22 8 - 23 mg/dL   Creatinine, Ser 0.92 0.44 - 1.00 mg/dL   Calcium 8.8 (L) 8.9 - 10.3 mg/dL   Total Protein 7.0 6.5 - 8.1 g/dL   Albumin 4.3 3.5 - 5.0 g/dL   AST 23 15 - 41 U/L   ALT 40 0 - 44 U/L   Alkaline Phosphatase 63 38 - 126 U/L   Total Bilirubin 0.6 0.3 - 1.2 mg/dL   GFR calc non Af Amer >60 >60 mL/min   GFR calc Af Amer >60 >60 mL/min   Anion gap 10 5 - 15  Type and screen  Result Value Ref Range   ABO/RH(D) A POS    Antibody Screen NEG    Sample Expiration 09/04/2019,2359    Extend sample reason      NO TRANSFUSIONS OR PREGNANCY IN THE PAST 3 MONTHS Performed at [facility], [address] [place], [place] [address]     Blood pressure medicine and blood pressure levels reviewed today with patient. Compliant with blood pressure medicine. States does not miss a dose. No obvious side effects. Blood pressure generally good  when checked elsewhere. Watching salt intake.   Patient's asthma ongoing challenge.  Compliant with medication.  Rare use of rescue inhaler.  Not exercising much currently   Review of Systems  No headache, no major weight loss or weight gain, no chest pain no back pain abdominal pain no change in bowel habits complete ROS otherwise negative     Objective:   Physical Exam  Alert and oriented, vitals reviewed and stable, NAD ENT-TM's and ext canals WNL bilat via otoscopic exam Soft palate, tonsils and post pharynx WNL via oropharyngeal exam Neck-symmetric, no masses; thyroid nonpalpable and nontender Pulmonary-no tachypnea or accessory muscle use; Clear without wheezes via auscultation Card--no abnrml murmurs, rhythm reg and rate WNL Carotid pulses symmetric, without bruits  Hands bilateral substantial Heberden's nodes.  Pulses good capillary refill.     Assessment & Plan:  Impression 1 hypertension very good control discussed to maintain same meds  2.  Hyperlipidemia.  Blood work reviewed.  Maintain same  dose diet discussed  3.  Asthma.  Chronic compliant meds overall good control  4.  Osteoporosis compliant meds  5.  Insomnia.  Good control discussed  7.  Hand symptoms secondary to osteoarthritis.  Discussed Follow-up in 6 months diet exercise discussed medications refilled

## 2019-08-25 ENCOUNTER — Encounter (HOSPITAL_COMMUNITY)
Admission: RE | Disposition: A | Discharge status: Home / Self Care | Payer: Self-pay | Source: Home / Self Care | Attending: Obstetrics and Gynecology

## 2019-08-25 ENCOUNTER — Ambulatory Visit (HOSPITAL_COMMUNITY): Payer: Medicare Other | Admitting: Anesthesiology

## 2019-08-25 ENCOUNTER — Encounter (HOSPITAL_COMMUNITY): Payer: Self-pay | Admitting: Obstetrics and Gynecology

## 2019-08-25 ENCOUNTER — Ambulatory Visit (HOSPITAL_COMMUNITY)
Admission: RE | Admit: 2019-08-25 | Discharge: 2019-08-25 | Disposition: A | Discharge status: Home / Self Care | Payer: Medicare Other | Attending: Obstetrics and Gynecology | Admitting: Obstetrics and Gynecology

## 2019-08-25 DIAGNOSIS — M1990 Unspecified osteoarthritis, unspecified site: Secondary | ICD-10-CM | POA: Insufficient documentation

## 2019-08-25 DIAGNOSIS — F419 Anxiety disorder, unspecified: Secondary | ICD-10-CM | POA: Insufficient documentation

## 2019-08-25 DIAGNOSIS — E782 Mixed hyperlipidemia: Secondary | ICD-10-CM | POA: Diagnosis not present

## 2019-08-25 DIAGNOSIS — I10 Essential (primary) hypertension: Secondary | ICD-10-CM | POA: Insufficient documentation

## 2019-08-25 DIAGNOSIS — N840 Polyp of corpus uteri: Secondary | ICD-10-CM | POA: Diagnosis not present

## 2019-08-25 DIAGNOSIS — Z7951 Long term (current) use of inhaled steroids: Secondary | ICD-10-CM | POA: Insufficient documentation

## 2019-08-25 DIAGNOSIS — K219 Gastro-esophageal reflux disease without esophagitis: Secondary | ICD-10-CM | POA: Diagnosis not present

## 2019-08-25 DIAGNOSIS — J45909 Unspecified asthma, uncomplicated: Secondary | ICD-10-CM | POA: Diagnosis not present

## 2019-08-25 DIAGNOSIS — Z79899 Other long term (current) drug therapy: Secondary | ICD-10-CM | POA: Insufficient documentation

## 2019-08-25 DIAGNOSIS — N950 Postmenopausal bleeding: Secondary | ICD-10-CM | POA: Diagnosis not present

## 2019-08-25 HISTORY — PX: HYSTEROSCOPY WITH D & C: SHX1775

## 2019-08-25 HISTORY — PX: POLYPECTOMY: SHX5525

## 2019-08-25 SURGERY — DILATATION AND CURETTAGE /HYSTEROSCOPY
Anesthesia: General

## 2019-08-25 MED ORDER — METOCLOPRAMIDE HCL 5 MG/ML IJ SOLN
INTRAMUSCULAR | Status: DC | PRN
  Administered 2019-08-25: 10 mg via INTRAVENOUS

## 2019-08-25 MED ORDER — DEXAMETHASONE SODIUM PHOSPHATE 10 MG/ML IJ SOLN
INTRAMUSCULAR | Status: DC | PRN
  Administered 2019-08-25: 10 mg via INTRAVENOUS

## 2019-08-25 MED ORDER — FENTANYL CITRATE (PF) 100 MCG/2ML IJ SOLN
INTRAMUSCULAR | Status: DC | PRN
  Administered 2019-08-25 (×2): 50 ug via INTRAVENOUS

## 2019-08-25 MED ORDER — ONDANSETRON HCL 4 MG/2ML IJ SOLN
INTRAMUSCULAR | Status: AC
  Filled 2019-08-25: qty 2

## 2019-08-25 MED ORDER — FENTANYL CITRATE (PF) 100 MCG/2ML IJ SOLN
INTRAMUSCULAR | Status: AC
  Filled 2019-08-25: qty 2

## 2019-08-25 MED ORDER — SODIUM CHLORIDE 0.9 % IR SOLN
Status: DC | PRN
  Administered 2019-08-25: 3000 mL

## 2019-08-25 MED ORDER — ONDANSETRON HCL 4 MG/2ML IJ SOLN: 4.0000 mg | Freq: Once | INTRAMUSCULAR | Status: DC | PRN

## 2019-08-25 MED ORDER — ONDANSETRON HCL 4 MG/2ML IJ SOLN
INTRAMUSCULAR | Status: DC | PRN
  Administered 2019-08-25: 4 mg via INTRAVENOUS

## 2019-08-25 MED ORDER — SCOPOLAMINE 1 MG/3DAYS TD PT72
MEDICATED_PATCH | TRANSDERMAL | Status: DC | PRN
  Administered 2019-08-25: 1 via TRANSDERMAL

## 2019-08-25 MED ORDER — BUPIVACAINE-EPINEPHRINE (PF) 0.5% -1:200000 IJ SOLN
INTRAMUSCULAR | Status: AC
  Filled 2019-08-25: qty 30

## 2019-08-25 MED ORDER — SODIUM CHLORIDE 0.9 % IR SOLN
Status: DC | PRN
  Administered 2019-08-25: 1000 mL

## 2019-08-25 MED ORDER — LACTATED RINGERS IV SOLN: INTRAVENOUS | Status: DC

## 2019-08-25 MED ORDER — PROPOFOL 10 MG/ML IV BOLUS
INTRAVENOUS | Status: AC
  Filled 2019-08-25: qty 20

## 2019-08-25 MED ORDER — PROPOFOL 10 MG/ML IV BOLUS
INTRAVENOUS | Status: DC | PRN
  Administered 2019-08-25: 50 mg via INTRAVENOUS
  Administered 2019-08-25: 150 mg via INTRAVENOUS

## 2019-08-25 MED ORDER — BUPIVACAINE-EPINEPHRINE (PF) 0.5% -1:200000 IJ SOLN
INTRAMUSCULAR | Status: DC | PRN
  Administered 2019-08-25: 10 mL via PERINEURAL

## 2019-08-25 MED ORDER — DEXAMETHASONE SODIUM PHOSPHATE 10 MG/ML IJ SOLN
INTRAMUSCULAR | Status: AC
  Filled 2019-08-25: qty 1

## 2019-08-25 MED ORDER — SCOPOLAMINE 1 MG/3DAYS TD PT72
MEDICATED_PATCH | TRANSDERMAL | Status: AC
  Filled 2019-08-25: qty 1

## 2019-08-25 MED ORDER — FENTANYL CITRATE (PF) 100 MCG/2ML IJ SOLN
25.0000 ug | INTRAMUSCULAR | Status: DC | PRN
  Administered 2019-08-25: 09:00:00 50 ug via INTRAVENOUS
  Filled 2019-08-25: qty 2

## 2019-08-25 SURGICAL SUPPLY — 26 items
CLOTH BEACON ORANGE TIMEOUT ST (SAFETY) ×3 IMPLANT
COVER LIGHT HANDLE STERIS (MISCELLANEOUS) ×6 IMPLANT
COVER WAND RF STERILE (DRAPES) ×3 IMPLANT
DECANTER SPIKE VIAL GLASS SM (MISCELLANEOUS) ×3 IMPLANT
ELECT REM PT RETURN 9FT ADLT (ELECTROSURGICAL) ×3
ELECTRODE REM PT RTRN 9FT ADLT (ELECTROSURGICAL) IMPLANT
GAUZE 4X4 16PLY RFD (DISPOSABLE) ×3 IMPLANT
GLOVE BIOGEL PI IND STRL 7.0 (GLOVE) ×2 IMPLANT
GLOVE BIOGEL PI IND STRL 9 (GLOVE) ×1 IMPLANT
GLOVE BIOGEL PI INDICATOR 7.0 (GLOVE) ×4
GLOVE BIOGEL PI INDICATOR 9 (GLOVE) ×2
GLOVE ECLIPSE 6.5 STRL STRAW (GLOVE) ×2 IMPLANT
GLOVE ECLIPSE 9.0 STRL (GLOVE) ×3 IMPLANT
GOWN SPEC L3 XXLG W/TWL (GOWN DISPOSABLE) ×3 IMPLANT
GOWN STRL REUS W/TWL LRG LVL3 (GOWN DISPOSABLE) ×3 IMPLANT
INST SET HYSTEROSCOPY (KITS) ×3 IMPLANT
IV NS IRRIG 3000ML ARTHROMATIC (IV SOLUTION) ×3 IMPLANT
KIT TURNOVER KIT A (KITS) ×3 IMPLANT
MANIFOLD NEPTUNE II (INSTRUMENTS) ×3 IMPLANT
NS IRRIG 1000ML POUR BTL (IV SOLUTION) ×3 IMPLANT
PACK PERI GYN (CUSTOM PROCEDURE TRAY) ×3 IMPLANT
PAD ARMBOARD 7.5X6 YLW CONV (MISCELLANEOUS) ×3 IMPLANT
PAD TELFA 3X4 1S STER (GAUZE/BANDAGES/DRESSINGS) ×5 IMPLANT
SET BASIN LINEN APH (SET/KITS/TRAYS/PACK) ×3 IMPLANT
SET CYSTO W/LG BORE CLAMP LF (SET/KITS/TRAYS/PACK) ×3 IMPLANT
SYR CONTROL 10ML LL (SYRINGE) ×3 IMPLANT

## 2019-08-25 NOTE — Brief Op Note (Signed)
08/25/2019  8:21 AM  PATIENT:  Breanna Mason  74 y.o. female  PRE-OPERATIVE DIAGNOSIS:  Endometrial Polyp postmenopausal bleeding  POST-OPERATIVE DIAGNOSIS:  Endometrial Polyp postmenopausal bleeding  PROCEDURE:  Procedure(s): DILATATION AND CURETTAGE /HYSTEROSCOPY (N/A) ENDOMETRIAL POLYPECTOMY (N/A)  SURGEON:  Surgeon(s) and Role:    Jonnie Kind, MD - Primary  PHYSICIAN ASSISTANT:   ASSISTANTS: Zoila Shutter, CST  ANESTHESIA:   general and paracervical block  EBL:  0 mL   BLOOD ADMINISTERED:none  DRAINS: none   LOCAL MEDICATIONS USED:  MARCAINE    and Amount: 10 ml  SPECIMEN:  Source of Specimen:  Endometrial polyp and uterine curettings  DISPOSITION OF SPECIMEN:  PATHOLOGY  COUNTS:  YES  TOURNIQUET:  * No tourniquets in log *  DICTATION: .Dragon Dictation  PLAN OF CARE: Discharge to home after PACU  PATIENT DISPOSITION:  PACU - hemodynamically stable.   Delay start of Pharmacological VTE agent (>24hrs) due to surgical blood loss or risk of bleeding: not applicable Details of procedure: Patient was taken the operating room prepped and draped for vaginal procedure with legs in yellowfin support, timeout conducted and procedure confirmed by operative team.  With speculum inserted in the vagina the small postmenopausal cervix could be identified and grasped on the anterior lip with a single-tooth tenaculum.  The uterus was sounded in the slightly retroverted position to 9 cm, dilated to 25 [place] allowing introduction of the rigid 30 degree operative hysteroscope visualized in the top of the large polyp which seemed to originate from the posterior fundal portion of the left side of the endometrial cavity.  The remainder of the endometrium was thin and atrophic.  The stalk of this was broad-based and somewhat vascular so hysteroscope was removed and Randall stone forceps used.  The cervix was dilated to 27 [place] allowing into easy introduction of the Randall stone  forceps which was used to grasp the fragments and extract the polyp in pieces.  Repeat hysteroscopy was performed to see how much of them removed and the remaining portion was removed on a second effort with the Randall stone forceps followed by replaced reinserting and confirming satisfactory removal of the polyp.  The fibrous strands of tissue remaining were sharply dissected off with the operative hysteroscope and extracted added to the specimen.  The photos were taken to confirm both tubal ostia and no evidence of uterine perforation. The endometrium was briefly curetted obtaining essentially no additional tissue and all tissue collected were sent in 1 specimen.  Patient tolerated procedure well.  She had some perineal irritation that was noted at the start of the case and was slightly worse at the end of the procedure after speculum insertion.  This was not enough to be considered a complication. Sponge and needle counts were correct with wand counting of the sponges confirmed the count.

## 2019-08-25 NOTE — Transfer of Care (Signed)
Immediate Anesthesia Transfer of Care Note  Patient: Breanna Mason  Procedure(s) Performed: DILATATION AND CURETTAGE /HYSTEROSCOPY (N/A ) ENDOMETRIAL POLYPECTOMY (N/A )  Patient Location: PACU  Anesthesia Type:General  Level of Consciousness: awake and patient cooperative  Airway & Oxygen Therapy: Patient Spontanous Breathing  Post-op Assessment: Report given to RN and Post -op Vital signs reviewed and stable  Post vital signs: Reviewed and stable  Last Vitals:  Vitals Value Taken Time  BP    Temp 97.5 ax   Pulse    Resp    SpO2      Last Pain:  Vitals:   08/25/19 0641  TempSrc: Oral  PainSc: 0-No pain      Patients Stated Pain Goal: 5 ([id number])  Complications: No apparent anesthesia complications

## 2019-08-25 NOTE — Anesthesia Postprocedure Evaluation (Signed)
Anesthesia Post Note  Patient: Breanna Mason  Procedure(s) Performed: DILATATION AND CURETTAGE /HYSTEROSCOPY (N/A ) ENDOMETRIAL POLYPECTOMY (N/A )  Patient location during evaluation: PACU Anesthesia Type: General Level of consciousness: awake and alert and patient cooperative Pain management: satisfactory to patient Vital Signs Assessment: post-procedure vital signs reviewed and stable Respiratory status: spontaneous breathing Cardiovascular status: stable Postop Assessment: no apparent nausea or vomiting Anesthetic complications: no     Last Vitals:  Vitals:   08/25/19 0845 08/25/19 0900  BP: (!) 148/85 (!) 147/77  Pulse: 93 94  Resp: 12   Temp:    SpO2: 93% 97%    Last Pain:  Vitals:   08/25/19 0830  TempSrc:   PainSc: 4                  ,

## 2019-08-25 NOTE — Anesthesia Preprocedure Evaluation (Signed)
Anesthesia Evaluation  Patient identified by MRN, date of birth, ID band Patient awake    Reviewed: Allergy & Precautions, H&P , NPO status , Patient's Chart, lab work & pertinent test results, reviewed documented beta blocker date and time   History of Anesthesia Complications (+) PONV and history of anesthetic complications  Airway Mallampati: II  TM Distance: >3 FB Neck ROM: full    Dental no notable dental hx. (+) Upper Dentures, Lower Dentures   Pulmonary asthma , pneumonia, resolved,    Pulmonary exam normal breath sounds clear to auscultation       Cardiovascular Exercise Tolerance: Good hypertension, negative cardio ROS   Rhythm:regular Rate:Normal     Neuro/Psych PSYCHIATRIC DISORDERS Anxiety negative neurological ROS     GI/Hepatic Neg liver ROS, GERD  ,  Endo/Other  negative endocrine ROS  Renal/GU negative Renal ROS  negative genitourinary   Musculoskeletal   Abdominal   Peds  Hematology negative hematology ROS (+)   Anesthesia Other Findings   Reproductive/Obstetrics negative OB ROS                             Anesthesia Physical Anesthesia Plan  ASA: II  Anesthesia Plan: General   Post-op Pain Management:    Induction:   PONV Risk Score and Plan: 3 and Ondansetron  Airway Management Planned:   Additional Equipment:   Intra-op Plan:   Post-operative Plan:   Informed Consent: I have reviewed the patients History and Physical, chart, labs and discussed the procedure including the risks, benefits and alternatives for the proposed anesthesia with the patient or authorized representative who has indicated his/her understanding and acceptance.     Dental Advisory Given  Plan Discussed with: CRNA  Anesthesia Plan Comments:         Anesthesia Quick Evaluation

## 2019-08-25 NOTE — Interval H&P Note (Signed)
History and Physical Interval Note:  08/25/2019 7:09 AM  Breanna Mason  has presented today for surgery, with the diagnosis of Endometrial Polyp.  The various methods of treatment have been discussed with the patient and family. After consideration of risks, benefits and other options for treatment, the patient has consented to  Procedure(s): DILATATION AND CURETTAGE /HYSTEROSCOPY (N/A) ENDOMETRIAL POLYPECTOMY (N/A) as a surgical intervention.   I have gone over the process of Hysteroscopy, with intended outcome, including the possibility of complications such as uterine perforation, and plans to run tests on any removed tissue.The patient's history has been reviewed, patient examined, no change in status, stable for surgery.  I have reviewed the patient's chart and labs.  Questions were answered to the patient's satisfaction.     Breanna Mason

## 2019-08-25 NOTE — Hospital Course (Signed)
Breanna Mason entered via day surgery had hysteroscopy D&C removal of a large endometrial polyp from the posterior fundal portion of the endometrium.  Tissue sample was sent to pathology.  The procedure itself was uncomplicated.  No antibiotic prophylaxis was required.  Patient to recovery in stable condition and for outpatient discharge the same day of procedure

## 2019-08-25 NOTE — Anesthesia Procedure Notes (Addendum)
Procedure Name: LMA Insertion Date/Time: 08/25/2019 7:34 AM Performed by: Vista Deck, CRNA Pre-anesthesia Checklist: Patient identified, Patient being monitored, Emergency Drugs available, Timeout performed and Suction available Patient Re-evaluated:Patient Re-evaluated prior to induction Oxygen Delivery Method: Circle System Utilized Preoxygenation: Pre-oxygenation with 100% oxygen Induction Type: IV induction Ventilation: Mask ventilation without difficulty LMA: LMA inserted LMA Size: 3.0 Number of attempts: 1 Placement Confirmation: positive ETCO2 and breath sounds checked- equal and bilateral Tube secured with: Tape Dental Injury: Teeth and Oropharynx as per pre-operative assessment

## 2019-08-25 NOTE — H&P (Signed)
Progress Notes by Junie Spencer at 07/01/2019 2:10 PM Author: Junie Spencer Author Type:  Filed: 07/10/2019 7:54 AM  Note Status: Addendum Breanna Mason: Cosign Not Required Encounter Date: 07/01/2019  Editor: Jonnie Kind, MD (Physician)  Prior Versions: 1. Jonnie Kind, MD (Physician) at 07/10/2019 7:51 AM - Signed   2. Junie Spencer at 07/01/2019 3:09 PM - Shared    Patient ID: Breanna Mason, female   DOB: May 06, 1946, 74 y.o.   MRN: [id number]    [facility] Visit  @DATE @            Patient name: Breanna Mason          MRN [id number]  Date of birth: 1945-11-18  CC & HPI:  Breanna Mason is a 74 y.o. female presenting today following pelvic/transvaginal ultrasound performed yesterday. Results showed a large mass in the endometrial cavity, 4 cm in length by 1.3 cm in width with color flow in the mass, and multiple internal cysts, most consistent with a large polyp. Surgical excision was recommended. No complaints at this time.  ROS:  ROS  All systems are negative except as noted in the HPI and PMH.  Pertinent History Reviewed:   Reviewed  Medical             Past Medical History:  Diagnosis Date  . Allergy   . Anxiety   . Arthritis   . Asthma   . Essential hypertension, benign   . GERD (gastroesophageal reflux disease)   . IFG (impaired fasting glucose)   . Impaired glucose tolerance   . Mixed hyperlipidemia   . Osteopenia   . Osteoporosis   . Pneumonia    hx  . PONV (postoperative nausea and vomiting)   . Reflux                               Surgical Hx:         Past Surgical History:  Procedure Laterality Date  . Bilateral tubal ligation    . CARPAL TUNNEL RELEASE Right    30 yrs  . CATARACT EXTRACTION W/PHACO Right 06/17/2017   Procedure: CATARACT EXTRACTION PHACO AND INTRAOCULAR LENS PLACEMENT RIGHT EYE;  Surgeon: Tonny Branch, MD;  Location: [facility];  Service: Ophthalmology;  Laterality: Right;  CDE: 17.57  .  CATARACT EXTRACTION W/PHACO Left 07/01/2017   Procedure: CATARACT EXTRACTION PHACO AND INTRAOCULAR LENS PLACEMENT (IOC);  Surgeon: Tonny Branch, MD;  Location: [facility];  Service: Ophthalmology;  Laterality: Left;  CDE: 17.35  . CESAREAN SECTION    . COLONOSCOPY N/A 09/17/2013   Procedure: COLONOSCOPY;  Surgeon: Rogene Houston, MD;  Location: [facility];  Service: Endoscopy;  Laterality: N/A;  1200  . COLONOSCOPY N/A 09/16/2017   Procedure: COLONOSCOPY;  Surgeon: Rogene Houston, MD;  Location: [facility];  Service: Endoscopy;  Laterality: N/A;  8:55  . REVERSE SHOULDER ARTHROPLASTY Right 03/11/2014   Procedure: RIGHT REVERSE SHOULDER ARTHROPLASTY;  Surgeon: Marin Shutter, MD;  Location: [place];  Service: Orthopedics;  Laterality: Right;  . SHOULDER SURGERY Right    x4  . TUBAL LIGATION     Medications: Reviewed & Updated - see associated section                       Current Outpatient Medications:  .  acetaminophen (TYLENOL) 500 MG tablet, Take 500 mg by mouth daily as  needed for moderate pain or headache., Disp: , Rfl:  .  alendronate (FOSAMAX) 70 MG tablet, TAKE 1 TABLET ONCE A WEEK WITH A FULL GLASS OF WATER ON AN EMPTY STOMACH, Disp: 12 tablet, Rfl: 0 .  ALPRAZolam (XANAX) 0.5 MG tablet, Take 1 tablet (0.5 mg total) by mouth 2 (two) times daily as needed for anxiety. for anxiety, Disp: 30 tablet, Rfl: 5 .  atorvastatin (LIPITOR) 40 MG tablet, Take 1 tablet (40 mg total) by mouth daily., Disp: 90 tablet, Rfl: 0 .  budesonide-formoterol (SYMBICORT) 160-4.5 MCG/ACT inhaler, Inhale 2 puffs into lungs BID, Disp: 1 Inhaler, Rfl: 5 .  calcium-vitamin D (OSCAL WITH D) 500-200 MG-UNIT per tablet, Take 1 tablet by mouth once a week. , Disp: , Rfl:  .  docusate sodium (COLACE) 50 MG capsule, Take 50 mg by mouth daily as needed for mild constipation. , Disp: , Rfl:  .  enalapril (VASOTEC) 10 MG tablet, Take 1 tablet (10 mg total) by mouth at bedtime., Disp: 90 tablet, Rfl: 0 .   famotidine (PEPCID) 40 MG tablet, Take 1 tablet (40 mg total) by mouth daily. Prn acid reflux, Disp: 90 tablet, Rfl: 0 .  meclizine (ANTIVERT) 25 MG tablet, TAKE 1 TABLET BY MOUTH TWICE DAILY AS NEEDED FOR DIZZINESS, Disp: 90 tablet, Rfl: 0 .  albuterol (PROVENTIL HFA;VENTOLIN HFA) 108 (90 Base) MCG/ACT inhaler, INHALE 2 PUFFS BY MOUTH EVERY 6 HOURS AS NEEDED FOR WHEEZING (Patient not taking: Reported on 07/01/2019), Disp: 18 g, Rfl: 0 .  ondansetron (ZOFRAN-ODT) 8 MG disintegrating tablet, Take 1 tablet (8 mg total) by mouth every 8 (eight) hours as needed for nausea or vomiting. (Patient not taking: Reported on 05/26/2019), Disp: 30 tablet, Rfl: 0   Social History: Reviewed -  reports that she has never smoked. She has never used smokeless tobacco.  Objective Findings:  Vitals: Blood pressure 135/77, pulse (!) 104, height 5\' 3"  (1.6 m), weight 160 lb (72.6 kg).  PHYSICAL EXAMINATION General appearance - alert, well appearing, and in no distress and oriented to person, place, and time Mental status - normal mood, behavior, speech, dress, motor activity, and thought processes, affect appropriate to mood Pelvic exam completed 05/26/2019, not repeated. These are findings of that date: Vulva and vagina appear normal. Bimanual exam reveals normal uterus and adnexa. External genitalia: normal general appearance Vaginal: normal mucosa without prolapse or lesions Cervix: normal appearance and thin prep PAP obtained Adnexa: normal bimanual exam  CMP Latest Ref Rng & Units 08/21/2019 08/07/2019 02/11/2019  Glucose 70 - 99 mg/dL 119(H) - 112(H)  BUN 8 - 23 mg/dL 22 - 17  Creatinine 0.44 - 1.00 mg/dL 0.92 - 0.71  Sodium 135 - 145 mmol/L 138 - 140  Potassium 3.5 - 5.1 mmol/L 3.7 - 4.4  Chloride 98 - 111 mmol/L 105 - 104  CO2 22 - 32 mmol/L 23 - 21  Calcium 8.9 - 10.3 mg/dL 8.8(L) - 9.4  Total Protein 6.5 - 8.1 g/dL 7.0 6.9 6.9  Total Bilirubin 0.3 - 1.2 mg/dL 0.6 0.5 0.7  Alkaline Phos 38 - 126 U/L  63 69 66  AST 15 - 41 U/L 23 21 28   ALT 0 - 44 U/L 40 32 42(H)   CBC Latest Ref Rng & Units 08/21/2019 02/11/2019 03/04/2018  WBC 4.0 - 10.5 K/uL 7.9 4.8 5.5  Hemoglobin 12.0 - 15.0 g/dL 14.1 14.5 14.9  Hematocrit 36.0 - 46.0 % 42.2 42.7 42.1  Platelets 150 - 400 K/uL 276 242 252  Assessment & Plan:   A:  1. Endometrial Polyp  P:  1.  Surgical excision, plan for removal by hysteroscopy, Dilation and curettage, removal of endometrial polyp.     Jonnie Kind, MD 08/25/2019

## 2019-08-25 NOTE — Discharge Instructions (Signed)
Hysteroscopy °Hysteroscopy is a procedure that is used to examine the inside of a woman's womb (uterus). This may be done for various reasons, including: °· To look for lumps (tumors) and other growths in the uterus. °· To evaluate abnormal bleeding, fibroid tumors, polyps, scar tissue (adhesions), or cancer of the uterus. °· To determine the cause of an inability to get pregnant (infertility) or repeated losses of pregnancies (miscarriages). °· To find a lost IUD (intrauterine device). °· To perform a procedure that permanently prevents pregnancy (sterilization). °During this procedure, a thin, flexible tube with a small light and camera (hysteroscope) is used to examine the uterus. The camera sends images to a monitor in the room so that your health care provider can view the inside of your uterus. A hysteroscopy should be done right after a menstrual period to make sure that you are not pregnant. °Tell a health care provider about: °· Any allergies you have. °· All medicines you are taking, including vitamins, herbs, eye drops, creams, and over-the-counter medicines. °· Any problems you or family members have had with the use of anesthetic medicines. °· Any blood disorders you have. °· Any surgeries you have had. °· Any medical conditions you have. °· Whether you are pregnant or may be pregnant. °What are the risks? °Generally, this is a safe procedure. However, problems may occur, including: °· Excessive bleeding. °· Infection. °· Damage to the uterus or other structures or organs. °· Allergic reaction to medicines or fluids that are used in the procedure. °What happens before the procedure? °Staying hydrated °Follow instructions from your health care provider about hydration, which may include: °· Up to 2 hours before the procedure - you may continue to drink clear liquids, such as water, clear fruit juice, black coffee, and plain tea. °Eating and drinking restrictions °Follow instructions from your health care  provider about eating and drinking, which may include: °· 8 hours before the procedure - stop eating solid foods and drink clear liquids only °· 2 hours before the procedure - stop drinking clear liquids. °General instructions °· Ask your health care provider about: °? Changing or stopping your normal medicines. This is important if you take diabetes medicines or blood thinners. °? Taking medicines such as aspirin and ibuprofen. These medicines can thin your blood and cause bleeding. Do not take these medicines for 1 week before your procedure, or as told by your health care provider. °· Do not use any products that contain nicotine or tobacco for 2 weeks before the procedure. This includes cigarettes and e-cigarettes. If you need help quitting, ask your health care provider. °· Medicine may be placed in your cervix the day before the procedure. This medicine causes the cervix to have a larger opening (dilate). The larger opening makes it easier for the hysteroscope to be inserted into the uterus during the procedure. °· Plan to have someone with you for the first 24-48 hours after the procedure, especially if you are given a medicine to make you fall asleep (general anesthetic). °· Plan to have someone take you home from the hospital or clinic. °What happens during the procedure? °· To lower your risk of infection: °? Your health care team will wash or sanitize their hands. °? Your skin will be washed with soap. °? Hair may be removed from the surgical area. °· An IV tube will be inserted into one of your veins. °· You may be given one or more of the following: °? A medicine to help   you relax (sedative). °? A medicine that numbs the area around the cervix (local anesthetic). °? A medicine to make you fall asleep (general anesthetic). °· A hysteroscope will be inserted through your vagina and into your uterus. °· Air or fluid will be used to enlarge your uterus, enabling your health care provider to see your uterus  better. The amount of fluid used will be carefully checked throughout the procedure. °· In some cases, tissue may be gently scraped from inside the uterus and sent to a lab for testing (biopsy). °The procedure may vary among health care providers and hospitals. °What happens after the procedure? °· Your blood pressure, heart rate, breathing rate, and blood oxygen level will be monitored until the medicines you were given have worn off. °· You may have some cramping. You may be given medicines for this. °· You may have bleeding, which varies from light spotting to menstrual-like bleeding. This is normal. °· If you had a biopsy done, it is your responsibility to get the results of your procedure. Ask your health care provider, or the department performing the procedure, when your results will be ready. °Summary °· Hysteroscopy is a procedure that is used to examine the inside of a woman's womb (uterus). °· After the procedure, you may have bleeding, which varies from light spotting to menstrual-like bleeding. This is normal. You may also have cramping. °· Plan to have someone take you home from the hospital or clinic. °This information is not intended to replace advice given to you by your health care provider. Make sure you discuss any questions you have with your health care provider. °Document Revised: 04/05/2017 Document Reviewed: 05/22/2016 °Elsevier Patient Education © 2020 [employer] ° °

## 2019-08-25 NOTE — Op Note (Signed)
08/25/2019  8:21 AM  PATIENT:  Breanna Mason  74 y.o. female  PRE-OPERATIVE DIAGNOSIS:  Endometrial Polyp postmenopausal bleeding  POST-OPERATIVE DIAGNOSIS:  Endometrial Polyp postmenopausal bleeding  PROCEDURE:  Procedure(s): DILATATION AND CURETTAGE /HYSTEROSCOPY (N/A) ENDOMETRIAL POLYPECTOMY (N/A)  SURGEON:  Surgeon(s) and Role:    Jonnie Kind, MD - Primary  PHYSICIAN ASSISTANT:   ASSISTANTS: Zoila Shutter, CST  ANESTHESIA:   general and paracervical block  EBL:  0 mL   BLOOD ADMINISTERED:none  DRAINS: none   LOCAL MEDICATIONS USED:  MARCAINE    and Amount: 10 ml  SPECIMEN:  Source of Specimen:  Endometrial polyp and uterine curettings  DISPOSITION OF SPECIMEN:  PATHOLOGY  COUNTS:  YES  TOURNIQUET:  * No tourniquets in log *  DICTATION: .Dragon Dictation  PLAN OF CARE: Discharge to home after PACU  PATIENT DISPOSITION:  PACU - hemodynamically stable.   Delay start of Pharmacological VTE agent (>24hrs) due to surgical blood loss or risk of bleeding: not applicable Details of procedure: Patient was taken the operating room prepped and draped for vaginal procedure with legs in yellowfin support, timeout conducted and procedure confirmed by operative team.  With speculum inserted in the vagina the small postmenopausal cervix could be identified and grasped on the anterior lip with a single-tooth tenaculum.  The uterus was sounded in the slightly retroverted position to 9 cm, dilated to 25 [place] allowing introduction of the rigid 30 degree operative hysteroscope visualized in the top of the large polyp which seemed to originate from the posterior fundal portion of the left side of the endometrial cavity.  The remainder of the endometrium was thin and atrophic.  The stalk of this was broad-based and somewhat vascular so hysteroscope was removed and Randall stone forceps used.  The cervix was dilated to 3 [place] allowing into easy introduction of the Randall stone  forceps which was used to grasp the fragments and extract the polyp in pieces.  Repeat hysteroscopy was performed to see how much of them removed and the remaining portion was removed on a second effort with the Randall stone forceps followed by replaced reinserting and confirming satisfactory removal of the polyp.  The fibrous strands of tissue remaining were sharply dissected off with the operative hysteroscope and extracted added to the specimen.  The photos were taken to confirm both tubal ostia and no evidence of uterine perforation. The endometrium was briefly curetted obtaining essentially no additional tissue and all tissue collected were sent in 1 specimen.  Patient tolerated procedure well.  She had some perineal irritation that was noted at the start of the case and was slightly worse at the end of the procedure after speculum insertion.  This was not enough to be considered a complication. Sponge and needle counts were correct with wand counting of the sponges confirmed the count.

## 2019-08-26 LAB — SURGICAL PATHOLOGY

## 2019-08-26 NOTE — Progress Notes (Signed)
Benign endometrial polyp, no sign of cancer.

## 2019-08-27 ENCOUNTER — Telehealth: Payer: Self-pay | Admitting: Obstetrics and Gynecology

## 2019-08-27 NOTE — Telephone Encounter (Signed)
Patient called, stated that she was returning your call.  [phone]

## 2019-08-27 NOTE — Telephone Encounter (Signed)
Pt informed of the benign results.

## 2019-08-27 NOTE — Telephone Encounter (Signed)
left message for pt to call office for results.

## 2019-08-27 NOTE — Telephone Encounter (Signed)
-----   Message from Jonnie Kind, MD sent at 08/26/2019 10:42 PM EDT ----- Benign endometrial polyp, no sign of cancer.

## 2019-09-07 ENCOUNTER — Encounter: Payer: Medicare Other | Admitting: Obstetrics and Gynecology

## 2019-09-09 ENCOUNTER — Encounter: Payer: Medicare Other | Admitting: Obstetrics and Gynecology

## 2019-11-13 ENCOUNTER — Encounter: Payer: Self-pay | Admitting: Family Medicine

## 2019-11-13 ENCOUNTER — Ambulatory Visit (INDEPENDENT_AMBULATORY_CARE_PROVIDER_SITE_OTHER): Payer: Medicare Other | Admitting: Family Medicine

## 2019-11-13 ENCOUNTER — Other Ambulatory Visit: Payer: Self-pay

## 2019-11-13 VITALS — BP 140/86 | Temp 97.4°F | Wt 162.0 lb

## 2019-11-13 DIAGNOSIS — L03116 Cellulitis of left lower limb: Secondary | ICD-10-CM

## 2019-11-13 DIAGNOSIS — T63481A Toxic effect of venom of other arthropod, accidental (unintentional), initial encounter: Secondary | ICD-10-CM

## 2019-11-13 MED ORDER — PREDNISONE 20 MG PO TABS: ORAL_TABLET | ORAL | 0 refills | Status: DC

## 2019-11-13 MED ORDER — DOXYCYCLINE HYCLATE 100 MG PO TABS: ORAL_TABLET | ORAL | 0 refills | Status: DC

## 2019-11-13 NOTE — Progress Notes (Signed)
   Subjective:    Patient ID: Breanna Mason, female    DOB: 1945/05/31, 74 y.o.   MRN: [id number]  HPI Patient comes in today with concerns of left foot swelling, red and itching since a bug bite on Tuesday.  Has been taking benadryl with little relief.  Patient with significant itching bug bite also relates some soreness and some redness and warmth and is moving up the ankle region she is concerned about possibility of infection denies fever chills never had this before she felt the bite it was painful when it occurred Review of Systems     Objective:   Physical Exam  Swollen area around the ankle along with some red warmth area.  No pus drainage from it.  More on the lateral aspect of the ankle.      Assessment & Plan:  Bug bite/bee sting Significant localized allergic reaction Some redness consistent with possible early cellulitis Antibiotics Steroid Notify us if progressive troubles or worse

## 2019-12-16 ENCOUNTER — Telehealth: Payer: Self-pay | Admitting: Family Medicine

## 2019-12-16 DIAGNOSIS — I10 Essential (primary) hypertension: Secondary | ICD-10-CM

## 2019-12-16 DIAGNOSIS — E782 Mixed hyperlipidemia: Secondary | ICD-10-CM

## 2019-12-16 DIAGNOSIS — R7303 Prediabetes: Secondary | ICD-10-CM

## 2019-12-16 NOTE — Telephone Encounter (Signed)
Last labs completed on 02/11/2019 TSH, CBC, HEPATIC, LIPID and A1C. Please advise. Thank you

## 2019-12-16 NOTE — Telephone Encounter (Signed)
Pt would like to know if she needs lab work done before her Oct 6th appt.

## 2019-12-18 NOTE — Telephone Encounter (Signed)
Yes, pls order those labs, except the tsh.   Thx. Dr. Lovena Le

## 2019-12-18 NOTE — Telephone Encounter (Signed)
Lab orders placed and pt is aware 

## 2019-12-31 DIAGNOSIS — H01001 Unspecified blepharitis right upper eyelid: Secondary | ICD-10-CM | POA: Diagnosis not present

## 2019-12-31 DIAGNOSIS — H01002 Unspecified blepharitis right lower eyelid: Secondary | ICD-10-CM | POA: Diagnosis not present

## 2019-12-31 DIAGNOSIS — H02834 Dermatochalasis of left upper eyelid: Secondary | ICD-10-CM | POA: Diagnosis not present

## 2019-12-31 DIAGNOSIS — H02831 Dermatochalasis of right upper eyelid: Secondary | ICD-10-CM | POA: Diagnosis not present

## 2020-01-13 ENCOUNTER — Other Ambulatory Visit (HOSPITAL_COMMUNITY): Payer: Self-pay | Admitting: Obstetrics and Gynecology

## 2020-01-13 DIAGNOSIS — Z1231 Encounter for screening mammogram for malignant neoplasm of breast: Secondary | ICD-10-CM

## 2020-01-29 DIAGNOSIS — R7303 Prediabetes: Secondary | ICD-10-CM | POA: Diagnosis not present

## 2020-01-29 DIAGNOSIS — I10 Essential (primary) hypertension: Secondary | ICD-10-CM | POA: Diagnosis not present

## 2020-01-29 DIAGNOSIS — E782 Mixed hyperlipidemia: Secondary | ICD-10-CM | POA: Diagnosis not present

## 2020-01-30 LAB — LIPID PANEL
Chol/HDL Ratio: 3.9 ratio (ref 0.0–4.4)
Cholesterol, Total: 192 mg/dL (ref 100–199)
HDL: 49 mg/dL (ref >39)
LDL Chol Calc (NIH): 117 mg/dL — ABNORMAL HIGH (ref 0–99)
Triglycerides: 146 mg/dL (ref 0–149)
VLDL Cholesterol Cal: 26 mg/dL (ref 5–40)

## 2020-01-30 LAB — HEMOGLOBIN A1C
Est. average glucose Bld gHb Est-mCnc: 128 mg/dL
Hgb A1c MFr Bld: 6.1 % — ABNORMAL HIGH (ref 4.8–5.6)

## 2020-01-30 LAB — CBC WITH DIFFERENTIAL/PLATELET
Basophils Absolute: 0 10*3/uL (ref 0.0–0.2)
Basos: 1 %
EOS (ABSOLUTE): 0.1 10*3/uL (ref 0.0–0.4)
Eos: 2 %
Hematocrit: 46.3 % (ref 34.0–46.6)
Hemoglobin: 15.4 g/dL (ref 11.1–15.9)
Immature Grans (Abs): 0 10*3/uL (ref 0.0–0.1)
Immature Granulocytes: 0 %
Lymphocytes Absolute: 1.4 10*3/uL (ref 0.7–3.1)
Lymphs: 27 %
MCH: 31.5 pg (ref 26.6–33.0)
MCHC: 33.3 g/dL (ref 31.5–35.7)
MCV: 95 fL (ref 79–97)
Monocytes Absolute: 0.7 10*3/uL (ref 0.1–0.9)
Monocytes: 14 %
Neutrophils Absolute: 2.9 10*3/uL (ref 1.4–7.0)
Neutrophils: 56 %
Platelets: 233 10*3/uL (ref 150–450)
RBC: 4.89 x10E6/uL (ref 3.77–5.28)
RDW: 13.1 % (ref 11.7–15.4)
WBC: 5.1 10*3/uL (ref 3.4–10.8)

## 2020-01-30 LAB — HEPATIC FUNCTION PANEL
ALT: 31 IU/L (ref 0–32)
AST: 21 IU/L (ref 0–40)
Albumin: 4.7 g/dL (ref 3.7–4.7)
Alkaline Phosphatase: 69 IU/L (ref 44–121)
Bilirubin Total: 0.6 mg/dL (ref 0.0–1.2)
Bilirubin, Direct: 0.14 mg/dL (ref 0.00–0.40)
Total Protein: 6.8 g/dL (ref 6.0–8.5)

## 2020-02-01 DIAGNOSIS — D225 Melanocytic nevi of trunk: Secondary | ICD-10-CM | POA: Diagnosis not present

## 2020-02-01 DIAGNOSIS — X32XXXD Exposure to sunlight, subsequent encounter: Secondary | ICD-10-CM | POA: Diagnosis not present

## 2020-02-01 DIAGNOSIS — L570 Actinic keratosis: Secondary | ICD-10-CM | POA: Diagnosis not present

## 2020-02-01 DIAGNOSIS — L820 Inflamed seborrheic keratosis: Secondary | ICD-10-CM | POA: Diagnosis not present

## 2020-02-10 ENCOUNTER — Ambulatory Visit: Payer: Medicare Other | Admitting: Family Medicine

## 2020-02-25 ENCOUNTER — Ambulatory Visit (HOSPITAL_COMMUNITY): Payer: Medicare Other

## 2020-02-29 ENCOUNTER — Ambulatory Visit (INDEPENDENT_AMBULATORY_CARE_PROVIDER_SITE_OTHER): Payer: Medicare Other | Admitting: Family Medicine

## 2020-02-29 ENCOUNTER — Encounter: Payer: Self-pay | Admitting: Family Medicine

## 2020-02-29 ENCOUNTER — Other Ambulatory Visit: Payer: Self-pay

## 2020-02-29 VITALS — BP 118/74 | HR 92 | Temp 97.7°F | Ht 63.0 in | Wt 161.0 lb

## 2020-02-29 DIAGNOSIS — Z23 Encounter for immunization: Secondary | ICD-10-CM | POA: Diagnosis not present

## 2020-02-29 DIAGNOSIS — E782 Mixed hyperlipidemia: Secondary | ICD-10-CM

## 2020-02-29 DIAGNOSIS — R7303 Prediabetes: Secondary | ICD-10-CM | POA: Diagnosis not present

## 2020-02-29 DIAGNOSIS — F419 Anxiety disorder, unspecified: Secondary | ICD-10-CM | POA: Diagnosis not present

## 2020-02-29 DIAGNOSIS — I10 Essential (primary) hypertension: Secondary | ICD-10-CM

## 2020-02-29 MED ORDER — MECLIZINE HCL 25 MG PO TABS: 25.0000 mg | ORAL_TABLET | Freq: Two times a day (BID) | ORAL | 0 refills | Status: DC | PRN

## 2020-02-29 MED ORDER — ALENDRONATE SODIUM 70 MG PO TABS: ORAL_TABLET | ORAL | 1 refills | Status: DC

## 2020-02-29 MED ORDER — ATORVASTATIN CALCIUM 40 MG PO TABS: 40.0000 mg | ORAL_TABLET | Freq: Every day | ORAL | 1 refills | Status: DC

## 2020-02-29 MED ORDER — ALPRAZOLAM 0.5 MG PO TABS
ORAL_TABLET | ORAL | 0 refills | Status: DC
Start: 2020-02-29 — End: 2020-10-28

## 2020-02-29 MED ORDER — ENALAPRIL MALEATE 10 MG PO TABS: 10.0000 mg | ORAL_TABLET | Freq: Every day | ORAL | 1 refills | Status: DC

## 2020-02-29 MED ORDER — FAMOTIDINE 40 MG PO TABS: 40.0000 mg | ORAL_TABLET | Freq: Every day | ORAL | 1 refills | Status: DC

## 2020-02-29 MED ORDER — ALBUTEROL SULFATE HFA 108 (90 BASE) MCG/ACT IN AERS
2.0000 | INHALATION_SPRAY | Freq: Four times a day (QID) | RESPIRATORY_TRACT | 0 refills | Status: DC | PRN
Start: 2020-02-29 — End: 2020-05-31

## 2020-02-29 NOTE — Patient Instructions (Signed)

## 2020-02-29 NOTE — Progress Notes (Signed)
Patient ID: Breanna Mason, female    DOB: 1945/06/09, 74 y.o.   MRN: [id number]   Chief Complaint  Patient presents with  . Hyperlipidemia   Subjective:    HPI  F/u on HLD, asthma, and anxiety.   Pt states she has had to use symbicort more than normal the past couple of days.   Would like senior flu vaccine today.   Prediabetes- Elevated 6.1 a1c. Watching what she's eating occasionally. occ checking 117-120 in am.  Using symbicort more than usual in last 2-3 day.  Not using albuterol.  They thought she had a mild case of asthma.  Was around second hand smoke as a child from both parents were smokers. Getting hard to breathe when exerting herself at times.  H/o anxiety- Pt has been taking xanax. Pt taking it since 2012. Pt stating she is taking only when needed.  Cutting in half when taking it, is on the 0.5mg  tablet and taking 1/2 tab.  HTN Pt compliant with BP meds.  No SEs Denies chest pain, sob, LE swelling, or blurry vision.  HLD- doing well no new concerns.  Compliant with meds. No chest pain, palpitations, myalgias or joint pains.  Medical History Areyanna has a past medical history of Allergy, Anxiety, Arthritis, Asthma, Essential hypertension, benign, GERD (gastroesophageal reflux disease), IFG (impaired fasting glucose), Impaired glucose tolerance, Mixed hyperlipidemia, Osteopenia, Osteoporosis, Pneumonia, PONV (postoperative nausea and vomiting), and Reflux.   Outpatient Encounter Medications as of 02/29/2020  Medication Sig  . acetaminophen (TYLENOL) 500 MG tablet Take 500-1,000 mg by mouth every 6 (six) hours as needed (for headaches/pain.).  [name] Kitchen alendronate (FOSAMAX) 70 MG tablet TAKE 1 TABLET ONCE A WEEK WITH A FULL GLASS OF WATER ON AN EMPTY STOMACH  . ALPRAZolam (XANAX) 0.5 MG tablet Take 1/2 tab p.o. bid prn anxiety.  [name] Kitchen atorvastatin (LIPITOR) 40 MG tablet Take 1 tablet (40 mg total) by mouth daily.  . budesonide-formoterol (SYMBICORT) 160-4.5 MCG/ACT  inhaler Inhale 2 puffs into lungs BID  . calcium-vitamin D (OSCAL WITH D) 500-200 MG-UNIT per tablet Take 1 tablet by mouth daily.   . enalapril (VASOTEC) 10 MG tablet Take 1 tablet (10 mg total) by mouth at bedtime.  . famotidine (PEPCID) 40 MG tablet Take 1 tablet (40 mg total) by mouth daily. Prn acid reflux  . meclizine (ANTIVERT) 25 MG tablet Take 1 tablet (25 mg total) by mouth 2 (two) times daily as needed for dizziness.  . [DISCONTINUED] alendronate (FOSAMAX) 70 MG tablet TAKE 1 TABLET ONCE A WEEK WITH A FULL GLASS OF WATER ON AN EMPTY STOMACH  . [DISCONTINUED] ALPRAZolam (XANAX) 0.5 MG tablet Take 1 tablet (0.5 mg total) by mouth 2 (two) times daily as needed for anxiety. for anxiety  . [DISCONTINUED] atorvastatin (LIPITOR) 40 MG tablet Take 1 tablet (40 mg total) by mouth daily.  . [DISCONTINUED] carboxymethylcellul-glycerin (OPTIVE) 0.5-0.9 % ophthalmic solution Place 1 drop into both eyes in the morning, at noon, and at bedtime.  . [DISCONTINUED] enalapril (VASOTEC) 10 MG tablet Take 1 tablet (10 mg total) by mouth at bedtime.  . [DISCONTINUED] famotidine (PEPCID) 40 MG tablet Take 1 tablet (40 mg total) by mouth daily. Prn acid reflux  . [DISCONTINUED] meclizine (ANTIVERT) 25 MG tablet TAKE 1 TABLET BY MOUTH TWICE DAILY AS NEEDED FOR DIZZINESS (Patient taking differently: Take 25 mg by mouth 2 (two) times daily as needed for dizziness. )  . albuterol (VENTOLIN HFA) 108 (90 Base) MCG/ACT inhaler Inhale 2 puffs into  the lungs every 6 (six) hours as needed for wheezing or shortness of breath.  . docusate sodium (COLACE) 50 MG capsule Take 50 mg by mouth daily as needed for mild constipation.   . [DISCONTINUED] doxycycline (VIBRA-TABS) 100 MG tablet One p o bid for 7 days  . [DISCONTINUED] predniSONE (DELTASONE) 20 MG tablet 2 qd for 5d   No facility-administered encounter medications on file as of 02/29/2020.     Review of Systems  Constitutional: Negative for chills and fever.    HENT: Negative for congestion, rhinorrhea and sore throat.   Respiratory: Negative for cough, shortness of breath and wheezing.   Cardiovascular: Negative for chest pain and leg swelling.  Gastrointestinal: Negative for abdominal pain, diarrhea, nausea and vomiting.  Genitourinary: Negative for dysuria and frequency.  Musculoskeletal: Negative for arthralgias and back pain.  Skin: Negative for rash.  Neurological: Negative for dizziness, weakness and headaches.     Vitals BP 118/74   Pulse 92   Temp 97.7 F (36.5 C)   Ht 5\' 3"  (1.6 m)   Wt 161 lb (73 kg)   SpO2 96%   BMI 28.52 kg/m   Objective:   Physical Exam Vitals and nursing note reviewed.  Constitutional:      Appearance: Normal appearance.  HENT:     Head: Normocephalic and atraumatic.     Nose: Nose normal.     Mouth/Throat:     Mouth: Mucous membranes are moist.     Pharynx: Oropharynx is clear.  Eyes:     Extraocular Movements: Extraocular movements intact.     Conjunctiva/sclera: Conjunctivae normal.     Pupils: Pupils are equal, round, and reactive to light.  Cardiovascular:     Rate and Rhythm: Normal rate and regular rhythm.     Pulses: Normal pulses.     Heart sounds: Normal heart sounds.  Pulmonary:     Effort: Pulmonary effort is normal.     Breath sounds: Normal breath sounds. No wheezing, rhonchi or rales.  Musculoskeletal:        General: Normal range of motion.     Right lower leg: No edema.     Left lower leg: No edema.  Skin:    General: Skin is warm and dry.     Findings: No lesion or rash.  Neurological:     General: No focal deficit present.     Mental Status: She is alert and oriented to person, place, and time.  Psychiatric:        Mood and Affect: Mood normal.        Behavior: Behavior normal.        Thought Content: Thought content normal.        Judgment: Judgment normal.      Assessment and Plan   1. Prediabetes  2. Essential hypertension, benign  3. Mixed  hyperlipidemia  4. Anxiety   Prediabetes-worsening.  Pt to watch carb intake and increase in exercising. htn- stable, cont meds. hld-stable. Cont meds.  Labs reviewed and stable.   HM-Wanting flu vaccine.  Anxiety- Reviewed with pt concerns of being on benzodiazepines over time and in pt over 14 yrs old.  Pt voiced understanding and was ok with decreasing this to just prn use, since she states she only takes it as needed. Pt reporting taking only 1/2 tab prn when needed.  Today re-wrote script reflecting how she's taking it. Also reviewed that I don't refill these medications with 6 mon supplies.  Pt voiced understanding.  pmp reviewed.  F/u 32mo.

## 2020-03-03 ENCOUNTER — Other Ambulatory Visit: Payer: Self-pay

## 2020-03-03 ENCOUNTER — Ambulatory Visit (HOSPITAL_COMMUNITY)
Admission: RE | Admit: 2020-03-03 | Discharge: 2020-03-03 | Disposition: A | Discharge status: Home / Self Care | Payer: Medicare Other | Source: Ambulatory Visit | Attending: Obstetrics and Gynecology | Admitting: Obstetrics and Gynecology

## 2020-03-03 DIAGNOSIS — Z1231 Encounter for screening mammogram for malignant neoplasm of breast: Secondary | ICD-10-CM

## 2020-05-31 ENCOUNTER — Other Ambulatory Visit: Payer: Self-pay | Admitting: Family Medicine

## 2020-07-08 ENCOUNTER — Telehealth: Payer: Self-pay

## 2020-07-08 DIAGNOSIS — I10 Essential (primary) hypertension: Secondary | ICD-10-CM

## 2020-07-08 DIAGNOSIS — R7303 Prediabetes: Secondary | ICD-10-CM

## 2020-07-08 DIAGNOSIS — Z79899 Other long term (current) drug therapy: Secondary | ICD-10-CM

## 2020-07-08 DIAGNOSIS — E782 Mixed hyperlipidemia: Secondary | ICD-10-CM

## 2020-07-08 NOTE — Telephone Encounter (Signed)
Patient has an appt in April for a 6 mos f/u and would like to have orders put in for labwork at [facility].  Patient would like to be notified when order has been put in.

## 2020-07-08 NOTE — Telephone Encounter (Signed)
Last labs 01/29/20 a1c, lipid, liver, cbc

## 2020-07-12 NOTE — Telephone Encounter (Signed)
Bw orders put in and pt notified.

## 2020-07-12 NOTE — Telephone Encounter (Signed)
Yes, pls order those and cmp. Thx. Dr. Lovena Le

## 2020-08-03 LAB — COMPREHENSIVE METABOLIC PANEL
ALT: 35 IU/L — ABNORMAL HIGH (ref 0–32)
AST: 22 IU/L (ref 0–40)
Albumin/Globulin Ratio: 2 (ref 1.2–2.2)
Albumin: 4.6 g/dL (ref 3.7–4.7)
Alkaline Phosphatase: 76 IU/L (ref 44–121)
BUN/Creatinine Ratio: 19 (ref 12–28)
BUN: 14 mg/dL (ref 8–27)
Bilirubin Total: 0.8 mg/dL (ref 0.0–1.2)
CO2: 20 mmol/L (ref 20–29)
Calcium: 9 mg/dL (ref 8.7–10.3)
Chloride: 105 mmol/L (ref 96–106)
Creatinine, Ser: 0.72 mg/dL (ref 0.57–1.00)
Globulin, Total: 2.3 g/dL (ref 1.5–4.5)
Glucose: 113 mg/dL — ABNORMAL HIGH (ref 65–99)
Potassium: 4.6 mmol/L (ref 3.5–5.2)
Sodium: 141 mmol/L (ref 134–144)
Total Protein: 6.9 g/dL (ref 6.0–8.5)
eGFR: 88 mL/min/{1.73_m2} (ref >59)

## 2020-08-03 LAB — CBC WITH DIFFERENTIAL/PLATELET
Basophils Absolute: 0 10*3/uL (ref 0.0–0.2)
Basos: 1 %
EOS (ABSOLUTE): 0.1 10*3/uL (ref 0.0–0.4)
Eos: 2 %
Hematocrit: 45.5 % (ref 34.0–46.6)
Hemoglobin: 15.6 g/dL (ref 11.1–15.9)
Immature Grans (Abs): 0 10*3/uL (ref 0.0–0.1)
Immature Granulocytes: 0 %
Lymphocytes Absolute: 1.6 10*3/uL (ref 0.7–3.1)
Lymphs: 32 %
MCH: 32 pg (ref 26.6–33.0)
MCHC: 34.3 g/dL (ref 31.5–35.7)
MCV: 93 fL (ref 79–97)
Monocytes Absolute: 0.7 10*3/uL (ref 0.1–0.9)
Monocytes: 13 %
Neutrophils Absolute: 2.6 10*3/uL (ref 1.4–7.0)
Neutrophils: 52 %
Platelets: 249 10*3/uL (ref 150–450)
RBC: 4.88 x10E6/uL (ref 3.77–5.28)
RDW: 13.1 % (ref 11.7–15.4)
WBC: 5.1 10*3/uL (ref 3.4–10.8)

## 2020-08-03 LAB — LIPID PANEL
Chol/HDL Ratio: 3.7 ratio (ref 0.0–4.4)
Cholesterol, Total: 197 mg/dL (ref 100–199)
HDL: 53 mg/dL (ref >39)
LDL Chol Calc (NIH): 123 mg/dL — ABNORMAL HIGH (ref 0–99)
Triglycerides: 120 mg/dL (ref 0–149)
VLDL Cholesterol Cal: 21 mg/dL (ref 5–40)

## 2020-08-03 LAB — HEMOGLOBIN A1C
Est. average glucose Bld gHb Est-mCnc: 126 mg/dL
Hgb A1c MFr Bld: 6 % — ABNORMAL HIGH (ref 4.8–5.6)

## 2020-08-29 ENCOUNTER — Encounter: Payer: Self-pay | Admitting: Family Medicine

## 2020-08-29 ENCOUNTER — Other Ambulatory Visit: Payer: Self-pay

## 2020-08-29 ENCOUNTER — Ambulatory Visit (INDEPENDENT_AMBULATORY_CARE_PROVIDER_SITE_OTHER): Payer: Medicare Other | Admitting: Family Medicine

## 2020-08-29 VITALS — BP 130/72 | HR 94 | Temp 97.5°F | Ht 63.0 in | Wt 159.0 lb

## 2020-08-29 DIAGNOSIS — I10 Essential (primary) hypertension: Secondary | ICD-10-CM

## 2020-08-29 DIAGNOSIS — K644 Residual hemorrhoidal skin tags: Secondary | ICD-10-CM

## 2020-08-29 DIAGNOSIS — R7303 Prediabetes: Secondary | ICD-10-CM | POA: Diagnosis not present

## 2020-08-29 DIAGNOSIS — M810 Age-related osteoporosis without current pathological fracture: Secondary | ICD-10-CM

## 2020-08-29 DIAGNOSIS — E782 Mixed hyperlipidemia: Secondary | ICD-10-CM

## 2020-08-29 MED ORDER — ALENDRONATE SODIUM 70 MG PO TABS
ORAL_TABLET | ORAL | 1 refills | Status: DC
Start: 2020-08-29 — End: 2021-02-27

## 2020-08-29 NOTE — Progress Notes (Signed)
Patient ID: Breanna Mason, female    DOB: 1946-03-21, 75 y.o.   MRN: [id number]   Chief Complaint  Patient presents with  . Prediabetes  . Hypertension   Subjective:    HPI  Pt seen to f/u prediabetes and htn.  Has husband home and just had back surgery 2 wk ago.   Pt having concern of skin tag vs. Hemorrhoid.   Pt had lanced hemorrhoid in past.  Then had one that can be reduced. Seen by Dr. Laural Golden in past.  No bleeding at this time. bm going normal. Checked for blood in stool, last year, and was negative. Due for colonoscopy 2026.  Used prep H in past.   Medical History Breanna Mason has a past medical history of Allergy, Anxiety, Arthritis, Asthma, Essential hypertension, benign, GERD (gastroesophageal reflux disease), IFG (impaired fasting glucose), Impaired glucose tolerance, Mixed hyperlipidemia, Osteopenia, Osteoporosis, Pneumonia, PONV (postoperative nausea and vomiting), and Reflux.   Outpatient Encounter Medications as of 08/29/2020  Medication Sig  . VENTOLIN HFA 108 (90 Base) MCG/ACT inhaler INHALE 2 PUFFS BY MOUTH EVERY 6 HOURS AS NEEDED FOR WHEEZING FOR SHORTNESS OF BREATH  . acetaminophen (TYLENOL) 500 MG tablet Take 500-1,000 mg by mouth every 6 (six) hours as needed (for headaches/pain.).  [name] Kitchen alendronate (FOSAMAX) 70 MG tablet TAKE 1 TABLET ONCE A WEEK WITH A FULL GLASS OF WATER ON AN EMPTY STOMACH  . ALPRAZolam (XANAX) 0.5 MG tablet Take 1/2 tab p.o. bid prn anxiety.  [name] Kitchen atorvastatin (LIPITOR) 40 MG tablet Take 1 tablet (40 mg total) by mouth daily.  . budesonide-formoterol (SYMBICORT) 160-4.5 MCG/ACT inhaler Inhale 2 puffs into lungs BID  . calcium-vitamin D (OSCAL WITH D) 500-200 MG-UNIT per tablet Take 1 tablet by mouth daily.   [name] Kitchen docusate sodium (COLACE) 50 MG capsule Take 50 mg by mouth daily as needed for mild constipation.   . enalapril (VASOTEC) 10 MG tablet Take 1 tablet (10 mg total) by mouth at bedtime.  . famotidine (PEPCID) 40 MG tablet Take 1 tablet  (40 mg total) by mouth daily. Prn acid reflux  . meclizine (ANTIVERT) 25 MG tablet Take 1 tablet (25 mg total) by mouth 2 (two) times daily as needed for dizziness.  . [DISCONTINUED] alendronate (FOSAMAX) 70 MG tablet TAKE 1 TABLET ONCE A WEEK WITH A FULL GLASS OF WATER ON AN EMPTY STOMACH   No facility-administered encounter medications on file as of 08/29/2020.     Review of Systems  Constitutional: Negative for chills and fever.  HENT: Negative for congestion, rhinorrhea and sore throat.   Respiratory: Negative for cough, shortness of breath and wheezing.   Cardiovascular: Negative for chest pain and leg swelling.  Gastrointestinal: Negative for abdominal pain, anal bleeding, blood in stool, constipation, diarrhea, nausea, rectal pain and vomiting.       +lump in rectal area.  Genitourinary: Negative for dysuria and frequency.  Musculoskeletal: Negative for arthralgias and back pain.  Skin: Negative for rash.  Neurological: Negative for dizziness, weakness and headaches.     Vitals BP 130/72   Pulse 94   Temp (!) 97.5 F (36.4 C)   Ht 5\' 3"  (1.6 m)   Wt 159 lb (72.1 kg)   SpO2 98%   BMI 28.17 kg/m   Objective:   Physical Exam Vitals and nursing note reviewed.  Constitutional:      General: She is not in acute distress.    Appearance: Normal appearance. She is not ill-appearing.  Cardiovascular:  Rate and Rhythm: Normal rate and regular rhythm.     Pulses: Normal pulses.     Heart sounds: Normal heart sounds.  Pulmonary:     Effort: Pulmonary effort is normal.     Breath sounds: Normal breath sounds.  Genitourinary:    Comments: +small skin colored hemorrhoid at 6 o'clock, non thrombosed. No bleeding. Neurological:     General: No focal deficit present.     Mental Status: She is alert and oriented to person, place, and time.  Psychiatric:        Mood and Affect: Mood normal.        Behavior: Behavior normal.      Assessment and Plan   1.  Prediabetes  2. Essential hypertension, benign  3. Mixed hyperlipidemia  4. Osteoporosis without current pathological fracture, unspecified osteoporosis type  5. External hemorrhoid   Pt to use preparation H for external hemorrhoid and increase fiber and water in the diet. Call or rto if worsening with pain or bleeding.  Pre-diabetes- stable, glucose 113. a1c 6.0.  Cont to monitor and dec carbs in diet.  htn- stable. Cont meds.  hld-stable. Cont meds.  Return in about 6 months (around 02/28/2021) for f/u htn, prediabetes.

## 2020-09-05 ENCOUNTER — Encounter: Payer: Self-pay | Admitting: Family Medicine

## 2020-09-30 ENCOUNTER — Other Ambulatory Visit: Payer: Self-pay | Admitting: Family Medicine

## 2020-10-27 ENCOUNTER — Other Ambulatory Visit: Payer: Self-pay | Admitting: Family Medicine

## 2020-10-28 ENCOUNTER — Ambulatory Visit (INDEPENDENT_AMBULATORY_CARE_PROVIDER_SITE_OTHER): Payer: Medicare Other | Admitting: Family Medicine

## 2020-10-28 ENCOUNTER — Other Ambulatory Visit: Payer: Self-pay

## 2020-10-28 ENCOUNTER — Ambulatory Visit (HOSPITAL_COMMUNITY)
Admission: RE | Admit: 2020-10-28 | Discharge: 2020-10-28 | Disposition: A | Discharge status: Home / Self Care | Payer: Medicare Other | Source: Ambulatory Visit | Attending: Family Medicine | Admitting: Family Medicine

## 2020-10-28 VITALS — BP 121/76 | HR 78 | Temp 97.2°F | Wt 157.2 lb

## 2020-10-28 DIAGNOSIS — M25561 Pain in right knee: Secondary | ICD-10-CM | POA: Insufficient documentation

## 2020-10-28 DIAGNOSIS — F419 Anxiety disorder, unspecified: Secondary | ICD-10-CM

## 2020-10-28 MED ORDER — ALPRAZOLAM 0.5 MG PO TABS: ORAL_TABLET | ORAL | 0 refills | Status: DC

## 2020-10-28 NOTE — Progress Notes (Signed)
Patient ID: Breanna Mason, female    DOB: Sep 21, 1945, 75 y.o.   MRN: [id number]   Chief Complaint  Patient presents with   Knee Pain   Subjective:    HPI Pt having swelling in right knee. Pt states it pops when she walks. Pt has used ice and Ibuprofen. Began to hurt about 3-4 weeks ago when getting into husbands truck.   4 wks ago started with pain. Hurting in back of the rt calf.  Knee cap pops when walking. Can't bear weight getting out of truck. Never had surgery or fracture in leg/knee. Meds- putting ice and tylenol for pain. Ace banage.  Pt stating having some anxiety with his son going through divorce. Pt taking xanax prn. Not having any memory concerns or falls.  Medical History Breanna Mason has a past medical history of Allergy, Anxiety, Arthritis, Asthma, Essential hypertension, benign, GERD (gastroesophageal reflux disease), IFG (impaired fasting glucose), Impaired glucose tolerance, Mixed hyperlipidemia, Osteopenia, Osteoporosis, Pneumonia, PONV (postoperative nausea and vomiting), and Reflux.   Outpatient Encounter Medications as of 10/28/2020  Medication Sig   acetaminophen (TYLENOL) 500 MG tablet Take 500-1,000 mg by mouth every 6 (six) hours as needed (for headaches/pain.).   alendronate (FOSAMAX) 70 MG tablet TAKE 1 TABLET ONCE A WEEK WITH A FULL GLASS OF WATER ON AN EMPTY STOMACH   atorvastatin (LIPITOR) 40 MG tablet Take 1 tablet by mouth once daily   budesonide-formoterol (SYMBICORT) 160-4.5 MCG/ACT inhaler Inhale 2 puffs into lungs BID   calcium-vitamin D (OSCAL WITH D) 500-200 MG-UNIT per tablet Take 1 tablet by mouth daily.    docusate sodium (COLACE) 50 MG capsule Take 50 mg by mouth daily as needed for mild constipation.    enalapril (VASOTEC) 10 MG tablet TAKE 1 TABLET BY MOUTH AT BEDTIME   famotidine (PEPCID) 40 MG tablet Take 1 tablet (40 mg total) by mouth daily. Prn acid reflux   meclizine (ANTIVERT) 25 MG tablet Take 1 tablet (25 mg total) by mouth  2 (two) times daily as needed for dizziness.   VENTOLIN HFA 108 (90 Base) MCG/ACT inhaler INHALE 2 PUFFS BY MOUTH EVERY 6 HOURS AS NEEDED FOR WHEEZING FOR SHORTNESS OF BREATH   [DISCONTINUED] ALPRAZolam (XANAX) 0.5 MG tablet Take 1/2 tab p.o. bid prn anxiety.   ALPRAZolam (XANAX) 0.5 MG tablet Take 1/2 tab p.o. bid prn anxiety.   No facility-administered encounter medications on file as of 10/28/2020.     Review of Systems  Constitutional:  Negative for chills and fever.  HENT:  Negative for congestion, rhinorrhea and sore throat.   Respiratory:  Negative for cough, shortness of breath and wheezing.   Cardiovascular:  Negative for chest pain and leg swelling.  Gastrointestinal:  Negative for abdominal pain, diarrhea, nausea and vomiting.  Genitourinary:  Negative for dysuria and frequency.  Musculoskeletal:  Positive for arthralgias (rt knee pain). Negative for back pain.  Skin:  Negative for rash.  Neurological:  Negative for dizziness, weakness and headaches.    Vitals BP 121/76   Pulse 78   Temp (!) 97.2 F (36.2 C)   Wt 157 lb 3.2 oz (71.3 kg)   SpO2 95%   BMI 27.85 kg/m   Objective:   Physical Exam Vitals and nursing note reviewed.  Constitutional:      General: She is not in acute distress.    Appearance: Normal appearance.  Musculoskeletal:        General: Swelling (milid) and tenderness (rt knee) present. No deformity. Normal  range of motion.  Skin:    General: Skin is warm and dry.     Findings: No rash.  Neurological:     General: No focal deficit present.     Mental Status: She is alert and oriented to person, place, and time.     Cranial Nerves: No cranial nerve deficit.  Psychiatric:        Mood and Affect: Mood normal.        Behavior: Behavior normal.        Thought Content: Thought content normal.     Assessment and Plan   1. Acute pain of right knee - DG Knee Complete 4 Views Right; Future  2. Anxiety - ALPRAZolam (XANAX) 0.5 MG tablet; Take  1/2 tab p.o. bid prn anxiety.  Dispense: 30 tablet; Refill: 0   Anxiety- Pt requesting refill on xanax.  Doing well and taking 1/2 tab prn.  Not taking daily.   Rt knee pain- ordered xray.  Take tylenol, rest, ice and elevate.   Return if symptoms worsen or fail to improve.

## 2020-10-28 NOTE — Telephone Encounter (Signed)
Pt needs a visit to discuss anxiety/dizziness.  Since last visit wasn't discussed.  Dr. Lovena Le

## 2020-10-28 NOTE — Telephone Encounter (Signed)
Patient seen in office 10/28/20

## 2020-10-31 ENCOUNTER — Other Ambulatory Visit: Payer: Self-pay | Admitting: Family Medicine

## 2020-10-31 DIAGNOSIS — M25561 Pain in right knee: Secondary | ICD-10-CM

## 2020-11-09 ENCOUNTER — Encounter: Payer: Self-pay | Admitting: Family Medicine

## 2020-11-21 ENCOUNTER — Other Ambulatory Visit: Payer: Self-pay | Admitting: Family Medicine

## 2021-01-03 ENCOUNTER — Other Ambulatory Visit: Payer: Self-pay

## 2021-01-03 MED ORDER — ENALAPRIL MALEATE 10 MG PO TABS: 10.0000 mg | ORAL_TABLET | Freq: Every day | ORAL | 0 refills | Status: DC

## 2021-01-19 ENCOUNTER — Other Ambulatory Visit (HOSPITAL_COMMUNITY): Payer: Self-pay | Admitting: Family Medicine

## 2021-01-19 DIAGNOSIS — Z1231 Encounter for screening mammogram for malignant neoplasm of breast: Secondary | ICD-10-CM

## 2021-02-16 ENCOUNTER — Telehealth: Payer: Self-pay | Admitting: Family Medicine

## 2021-02-16 DIAGNOSIS — R7303 Prediabetes: Secondary | ICD-10-CM

## 2021-02-16 DIAGNOSIS — E782 Mixed hyperlipidemia: Secondary | ICD-10-CM

## 2021-02-16 DIAGNOSIS — I10 Essential (primary) hypertension: Secondary | ICD-10-CM

## 2021-02-16 DIAGNOSIS — Z79899 Other long term (current) drug therapy: Secondary | ICD-10-CM

## 2021-02-16 NOTE — Telephone Encounter (Signed)
Please order her labs - CBC, A1C, CMP w/GFR, Lipid.  Thank you  Dr. Lacinda Axon

## 2021-02-16 NOTE — Telephone Encounter (Signed)
Patient is requesting labs for her 6 month follow up if she due them

## 2021-02-16 NOTE — Telephone Encounter (Signed)
Last labs completed 08/02/20 CMP, A1C, CBC, Lipid. Please advise. Thank you

## 2021-02-16 NOTE — Telephone Encounter (Signed)
Lab orders placed and pt is aware 

## 2021-02-24 LAB — CBC WITH DIFFERENTIAL/PLATELET
Basophils Absolute: 0 10*3/uL (ref 0.0–0.2)
Basos: 1 %
EOS (ABSOLUTE): 0.1 10*3/uL (ref 0.0–0.4)
Eos: 1 %
Hematocrit: 45 % (ref 34.0–46.6)
Hemoglobin: 15.5 g/dL (ref 11.1–15.9)
Immature Grans (Abs): 0 10*3/uL (ref 0.0–0.1)
Immature Granulocytes: 0 %
Lymphocytes Absolute: 1.4 10*3/uL (ref 0.7–3.1)
Lymphs: 28 %
MCH: 31.8 pg (ref 26.6–33.0)
MCHC: 34.4 g/dL (ref 31.5–35.7)
MCV: 92 fL (ref 79–97)
Monocytes Absolute: 0.7 10*3/uL (ref 0.1–0.9)
Monocytes: 14 %
Neutrophils Absolute: 2.9 10*3/uL (ref 1.4–7.0)
Neutrophils: 56 %
Platelets: 241 10*3/uL (ref 150–450)
RBC: 4.87 x10E6/uL (ref 3.77–5.28)
RDW: 12.5 % (ref 11.7–15.4)
WBC: 5.1 10*3/uL (ref 3.4–10.8)

## 2021-02-24 LAB — CMP14+EGFR
ALT: 29 IU/L (ref 0–32)
AST: 18 IU/L (ref 0–40)
Albumin/Globulin Ratio: 1.9 (ref 1.2–2.2)
Albumin: 4.7 g/dL (ref 3.7–4.7)
Alkaline Phosphatase: 74 IU/L (ref 44–121)
BUN/Creatinine Ratio: 32 — ABNORMAL HIGH (ref 12–28)
BUN: 24 mg/dL (ref 8–27)
Bilirubin Total: 0.5 mg/dL (ref 0.0–1.2)
CO2: 23 mmol/L (ref 20–29)
Calcium: 9.3 mg/dL (ref 8.7–10.3)
Chloride: 104 mmol/L (ref 96–106)
Creatinine, Ser: 0.76 mg/dL (ref 0.57–1.00)
Globulin, Total: 2.5 g/dL (ref 1.5–4.5)
Glucose: 113 mg/dL — ABNORMAL HIGH (ref 70–99)
Potassium: 4.6 mmol/L (ref 3.5–5.2)
Sodium: 141 mmol/L (ref 134–144)
Total Protein: 7.2 g/dL (ref 6.0–8.5)
eGFR: 82 mL/min/{1.73_m2} (ref >59)

## 2021-02-24 LAB — LIPID PANEL
Chol/HDL Ratio: 3.6 ratio (ref 0.0–4.4)
Cholesterol, Total: 196 mg/dL (ref 100–199)
HDL: 55 mg/dL (ref >39)
LDL Chol Calc (NIH): 123 mg/dL — ABNORMAL HIGH (ref 0–99)
Triglycerides: 101 mg/dL (ref 0–149)
VLDL Cholesterol Cal: 18 mg/dL (ref 5–40)

## 2021-02-24 LAB — HEMOGLOBIN A1C
Est. average glucose Bld gHb Est-mCnc: 117 mg/dL
Hgb A1c MFr Bld: 5.7 % — ABNORMAL HIGH (ref 4.8–5.6)

## 2021-02-27 ENCOUNTER — Other Ambulatory Visit: Payer: Self-pay

## 2021-02-27 ENCOUNTER — Other Ambulatory Visit: Payer: Self-pay | Admitting: Family Medicine

## 2021-02-27 MED ORDER — ATORVASTATIN CALCIUM 80 MG PO TABS: ORAL_TABLET | ORAL | 1 refills | Status: DC

## 2021-03-02 ENCOUNTER — Other Ambulatory Visit: Payer: Self-pay

## 2021-03-02 ENCOUNTER — Ambulatory Visit (INDEPENDENT_AMBULATORY_CARE_PROVIDER_SITE_OTHER): Payer: Medicare Other | Admitting: Family Medicine

## 2021-03-02 VITALS — BP 126/83 | HR 74 | Ht 63.0 in | Wt 156.4 lb

## 2021-03-02 DIAGNOSIS — F419 Anxiety disorder, unspecified: Secondary | ICD-10-CM

## 2021-03-02 DIAGNOSIS — I10 Essential (primary) hypertension: Secondary | ICD-10-CM | POA: Diagnosis not present

## 2021-03-02 DIAGNOSIS — E782 Mixed hyperlipidemia: Secondary | ICD-10-CM

## 2021-03-02 DIAGNOSIS — M810 Age-related osteoporosis without current pathological fracture: Secondary | ICD-10-CM | POA: Diagnosis not present

## 2021-03-02 MED ORDER — VENTOLIN HFA 108 (90 BASE) MCG/ACT IN AERS: INHALATION_SPRAY | RESPIRATORY_TRACT | 3 refills | Status: DC

## 2021-03-02 MED ORDER — ENALAPRIL MALEATE 10 MG PO TABS: 10.0000 mg | ORAL_TABLET | Freq: Every day | ORAL | 1 refills | Status: DC

## 2021-03-02 NOTE — Patient Instructions (Signed)
We will call regarding the bone density test.  Continue your current meds.  Follow up in 6 months  Dr. Lacinda Axon

## 2021-03-03 DIAGNOSIS — F419 Anxiety disorder, unspecified: Secondary | ICD-10-CM | POA: Insufficient documentation

## 2021-03-03 NOTE — Assessment & Plan Note (Signed)
At goal.  Continue enalapril. 

## 2021-03-03 NOTE — Assessment & Plan Note (Signed)
Uncontrolled.  Lipitor was just increased to 80 mg daily.  Continue.

## 2021-03-03 NOTE — Progress Notes (Signed)
Subjective:  Patient ID: Breanna Mason, female    DOB: 02/02/1946  Age: 75 y.o. MRN: [id number]  CC: Chief Complaint  Patient presents with   Hypertension    Follow up- establish care Discuss recent labs Would like to discuss if a cheaper inhaler is possible-Symbicort is expensive    HPI:  75 year old female with past medical history of anxiety, hyperlipidemia, hypertension, osteoporosis presents for follow-up.  Patient states that she is doing well.  Her hypertension is well controlled on enalapril.  Patient states that she needs a refill on this today.  I have reviewed her laboratory studies with her.  We just increased Lipitor to 80 mg daily given the fact that her lipids are not at goal.  She states that she is not having any trouble with medication.  She is tolerating the higher dose.  Patient reports that she has been on Fosamax for greater than 5 years.  She has not had a recent DEXA scan.  She is amenable to this.  No recent falls or fractures.  Patient also states that her anxiety is doing well.  She uses Xanax sparingly.  She does not need a refill today.  Patient Active Problem List   Diagnosis Date Noted   Anxiety 03/03/2021   Osteoporosis without current pathological fracture 08/29/2020   Prediabetes 09/08/2017   Rotator cuff tear arthropathy of right shoulder 03/12/2014   S/P shoulder replacement 03/11/2014   Esophageal reflux 09/07/2012   Essential hypertension, benign 03/06/2011   Mixed hyperlipidemia 03/06/2011   Asthma 03/06/2011    Social Hx   Social History   Socioeconomic History   Marital status: Married    Spouse name: Not on file   Number of children: Not on file   Years of education: Not on file   Highest education level: Not on file  Occupational History   Occupation: Engineering geologist    Comment: Retired  Tobacco Use   Smoking status: Never   Smokeless tobacco: Never  Vaping Use   Vaping Use: Never used  Substance and Sexual Activity    Alcohol use: No   Drug use: No   Sexual activity: Not Currently    Birth control/protection: Post-menopausal  Other Topics Concern   Not on file  Social History Narrative   Not on file   Social Determinants of Health   Financial Resource Strain: Not on file  Food Insecurity: Not on file  Transportation Needs: Not on file  Physical Activity: Not on file  Stress: Not on file  Social Connections: Not on file    Review of Systems  Constitutional: Negative.   Psychiatric/Behavioral:  The patient is nervous/anxious.     Objective:  BP 126/83   Pulse 74   Ht 5\' 3"  (1.6 m)   Wt 156 lb 6.4 oz (70.9 kg)   BMI 27.71 kg/m   BP/Weight 03/02/2021 10/28/2020 9/92/4268  Systolic BP 341 962 229  Diastolic BP 83 76 72  Wt. (Lbs) 156.4 157.2 159  BMI 27.71 27.85 28.17    Physical Exam Vitals and nursing note reviewed.  Constitutional:      General: She is not in acute distress.    Appearance: Normal appearance. She is not ill-appearing.  HENT:     Head: Normocephalic and atraumatic.  Cardiovascular:     Rate and Rhythm: Normal rate and regular rhythm.  Pulmonary:     Effort: Pulmonary effort is normal.     Breath sounds: Normal breath sounds. No wheezing, rhonchi  or rales.  Neurological:     Mental Status: She is alert.  Psychiatric:        Mood and Affect: Mood normal.        Behavior: Behavior normal.    Lab Results  Component Value Date   WBC 5.1 02/23/2021   HGB 15.5 02/23/2021   HCT 45.0 02/23/2021   PLT 241 02/23/2021   GLUCOSE 113 (H) 02/23/2021   CHOL 196 02/23/2021   TRIG 101 02/23/2021   HDL 55 02/23/2021   LDLCALC 123 (H) 02/23/2021   ALT 29 02/23/2021   AST 18 02/23/2021   NA 141 02/23/2021   K 4.6 02/23/2021   CL 104 02/23/2021   CREATININE 0.76 02/23/2021   BUN 24 02/23/2021   CO2 23 02/23/2021   TSH 2.320 02/11/2019   HGBA1C 5.7 (H) 02/23/2021     Assessment & Plan:   Problem List Items Addressed This Visit       Cardiovascular and  Mediastinum   Essential hypertension, benign - Primary    At goal.  Continue enalapril.      Relevant Medications   enalapril (VASOTEC) 10 MG tablet     Musculoskeletal and Integument   Osteoporosis without current pathological fracture    Will arrange DEXA scan.  She has not had 1 in years.  She has been taking Fosamax for greater than 5 years.      Relevant Orders   DG Bone Density     Other   Mixed hyperlipidemia    Uncontrolled.  Lipitor was just increased to 80 mg daily.  Continue.      Relevant Medications   enalapril (VASOTEC) 10 MG tablet   Anxiety    Stable.  Uses Xanax sparingly.        Meds ordered this encounter  Medications   enalapril (VASOTEC) 10 MG tablet    Sig: Take 1 tablet (10 mg total) by mouth at bedtime.    Dispense:  90 tablet    Refill:  1   VENTOLIN HFA 108 (90 Base) MCG/ACT inhaler    Sig: INHALE 2 PUFFS BY MOUTH EVERY 6 HOURS AS NEEDED FOR WHEEZING FOR SHORTNESS OF BREATH    Dispense:  18 g    Refill:  3    Follow-up:  Return in about 6 months (around 08/31/2021) for Follow up Chronic medical issues.  [facility]

## 2021-03-03 NOTE — Assessment & Plan Note (Signed)
Will arrange DEXA scan.  She has not had 1 in years.  She has been taking Fosamax for greater than 5 years.

## 2021-03-03 NOTE — Assessment & Plan Note (Signed)
Stable.  Uses Xanax sparingly.

## 2021-03-09 ENCOUNTER — Other Ambulatory Visit: Payer: Self-pay

## 2021-03-09 ENCOUNTER — Other Ambulatory Visit (HOSPITAL_COMMUNITY): Payer: Self-pay | Admitting: Family Medicine

## 2021-03-09 ENCOUNTER — Ambulatory Visit (HOSPITAL_COMMUNITY)
Admission: RE | Admit: 2021-03-09 | Discharge: 2021-03-09 | Disposition: A | Discharge status: Home / Self Care | Payer: Medicare Other | Source: Ambulatory Visit | Attending: Family Medicine | Admitting: Family Medicine

## 2021-03-09 DIAGNOSIS — Z1231 Encounter for screening mammogram for malignant neoplasm of breast: Secondary | ICD-10-CM

## 2021-03-14 ENCOUNTER — Other Ambulatory Visit: Payer: Self-pay | Admitting: Family Medicine

## 2021-03-14 DIAGNOSIS — F419 Anxiety disorder, unspecified: Secondary | ICD-10-CM

## 2021-03-24 ENCOUNTER — Telehealth: Payer: Self-pay | Admitting: Family Medicine

## 2021-03-24 NOTE — Telephone Encounter (Signed)
Bone Density scheduled for Thursday Apr 06 2021 at 2:30 arrive at 2:15. Pt contacted and verbalized understanding.   Pt states she had to go back to Atorvastatin  40 mg because she could not breath with the 80 mg.

## 2021-03-24 NOTE — Telephone Encounter (Signed)
Patient is checking on bone density test. She states hadnt heard anything she was seen 03/02/21. She can go on Thursday or Friday

## 2021-04-03 ENCOUNTER — Telehealth: Payer: Self-pay | Admitting: Family Medicine

## 2021-04-03 NOTE — Telephone Encounter (Signed)
Patient has a bone density test Thursday 12/1 and and needs a "written signed note" from Dr. Lacinda Axon. She was seen 10/27 and was told that she would be getting this and called to make sure it was available for her appointment 12/1 Please advise.  CB#  [phone]

## 2021-04-03 NOTE — Telephone Encounter (Addendum)
Order was electronically signed by Dr Lacinda Axon and patient was notified

## 2021-04-06 ENCOUNTER — Other Ambulatory Visit: Payer: Self-pay

## 2021-04-06 ENCOUNTER — Ambulatory Visit (HOSPITAL_COMMUNITY)
Admission: RE | Admit: 2021-04-06 | Discharge: 2021-04-06 | Disposition: A | Discharge status: Home / Self Care | Payer: Medicare Other | Source: Ambulatory Visit | Attending: Family Medicine | Admitting: Family Medicine

## 2021-04-06 DIAGNOSIS — M810 Age-related osteoporosis without current pathological fracture: Secondary | ICD-10-CM | POA: Insufficient documentation

## 2021-04-10 ENCOUNTER — Telehealth: Payer: Self-pay | Admitting: Family Medicine

## 2021-04-10 NOTE — Telephone Encounter (Signed)
Pt would like to go back on Atorvastatin 40 mg. Pt states the 80 mg is causing her to not be able to breath and affecting joints to where she is barely able to walk. Please advise. Thank you [facility].

## 2021-04-11 NOTE — Telephone Encounter (Signed)
Pt contacted and verbalized understanding. Pt will begin 40 mg. Pt has 80 mg on hand and will cut them in half.

## 2021-04-23 ENCOUNTER — Ambulatory Visit
Admission: EM | Admit: 2021-04-23 | Discharge: 2021-04-23 | Disposition: A | Discharge status: Home / Self Care | Payer: Medicare Other | Attending: Family Medicine | Admitting: Family Medicine

## 2021-04-23 ENCOUNTER — Other Ambulatory Visit: Payer: Self-pay

## 2021-04-23 DIAGNOSIS — Z20828 Contact with and (suspected) exposure to other viral communicable diseases: Secondary | ICD-10-CM

## 2021-04-23 DIAGNOSIS — J4521 Mild intermittent asthma with (acute) exacerbation: Secondary | ICD-10-CM

## 2021-04-23 DIAGNOSIS — J069 Acute upper respiratory infection, unspecified: Secondary | ICD-10-CM | POA: Diagnosis not present

## 2021-04-23 MED ORDER — PREDNISONE 20 MG PO TABS: 40.0000 mg | ORAL_TABLET | Freq: Every day | ORAL | 0 refills | Status: DC

## 2021-04-23 MED ORDER — PROMETHAZINE-DM 6.25-15 MG/5ML PO SYRP: 5.0000 mL | ORAL_SOLUTION | Freq: Four times a day (QID) | ORAL | 0 refills | Status: DC | PRN

## 2021-04-23 NOTE — ED Triage Notes (Signed)
Patient states that on Wednesday night her throat started hurting and then she started cough with yellow mucus.   She states that her right ear is hurting. She states her grandkids had infected ears at the beginning of the week and she thinks that may have been where she was exposed.   She states her voice was gone yesterday.  Patient states she has taken Mucinex and Delsym and cough meds. Last dose of meds last night.   Denies Fever

## 2021-04-23 NOTE — ED Provider Notes (Signed)
[facility]    CSN: [id number] Arrival date & time: 04/23/21  0809      History   Chief Complaint Chief Complaint  Patient presents with   Cough    Cough and ear pain    HPI Breanna Mason is a 75 y.o. female.   Patient resenting today with 5-day history of sore throat, productive cough, nasal congestion, right ear pain and pressure, hoarseness.  Denies known fever, chills, body aches, chest pain but has had some increased wheezing, chest tightness only relieved short-term by her inhalers.  History of asthma on albuterol as needed.  Taking Mucinex DM, Delsym, albuterol with mild temporary relief of symptoms.  Multiple sick contacts recently.  Past Medical History:  Diagnosis Date   Allergy    Anxiety    Arthritis    Asthma    Essential hypertension, benign    GERD (gastroesophageal reflux disease)    IFG (impaired fasting glucose)    Impaired glucose tolerance    Mixed hyperlipidemia    Osteopenia    Osteoporosis    Pneumonia    hx   PONV (postoperative nausea and vomiting)    Reflux     Patient Active Problem List   Diagnosis Date Noted   Anxiety 03/03/2021   Osteoporosis without current pathological fracture 08/29/2020   Prediabetes 09/08/2017   Rotator cuff tear arthropathy of right shoulder 03/12/2014   S/P shoulder replacement 03/11/2014   Esophageal reflux 09/07/2012   Essential hypertension, benign 03/06/2011   Mixed hyperlipidemia 03/06/2011   Asthma 03/06/2011    Past Surgical History:  Procedure Laterality Date   Bilateral tubal ligation     CARPAL TUNNEL RELEASE Right    30 yrs   CATARACT EXTRACTION W/PHACO Right 06/17/2017   Procedure: CATARACT EXTRACTION PHACO AND INTRAOCULAR LENS PLACEMENT RIGHT EYE;  Surgeon: Tonny Branch, MD;  Location: [facility];  Service: Ophthalmology;  Laterality: Right;  CDE: 17.57   CATARACT EXTRACTION W/PHACO Left 07/01/2017   Procedure: CATARACT EXTRACTION PHACO AND INTRAOCULAR LENS PLACEMENT (IOC);   Surgeon: Tonny Branch, MD;  Location: [facility];  Service: Ophthalmology;  Laterality: Left;  CDE: 17.35   CESAREAN SECTION     COLONOSCOPY N/A 09/17/2013   Procedure: COLONOSCOPY;  Surgeon: Rogene Houston, MD;  Location: [facility];  Service: Endoscopy;  Laterality: N/A;  1200   COLONOSCOPY N/A 09/16/2017   Procedure: COLONOSCOPY;  Surgeon: Rogene Houston, MD;  Location: [facility];  Service: Endoscopy;  Laterality: N/A;  8:55   HYSTEROSCOPY WITH D & C N/A 08/25/2019   Procedure: DILATATION AND CURETTAGE /HYSTEROSCOPY;  Surgeon: Jonnie Kind, MD;  Location: [facility];  Service: Gynecology;  Laterality: N/A;   POLYPECTOMY N/A 08/25/2019   Procedure: ENDOMETRIAL POLYPECTOMY;  Surgeon: Jonnie Kind, MD;  Location: [facility];  Service: Gynecology;  Laterality: N/A;   REVERSE SHOULDER ARTHROPLASTY Right 03/11/2014   Procedure: RIGHT REVERSE SHOULDER ARTHROPLASTY;  Surgeon: Marin Shutter, MD;  Location: [place];  Service: Orthopedics;  Laterality: Right;   SHOULDER SURGERY Right    x4   TUBAL LIGATION      OB History     Gravida  1   Para  1   Term      Preterm      AB      Living  2      SAB      IAB      Ectopic      Multiple  Live Births               Home Medications    Prior to Admission medications   Medication Sig Start Date End Date Taking? Authorizing Provider  predniSONE (DELTASONE) 20 MG tablet Take 2 tablets (40 mg total) by mouth daily with breakfast. 04/23/21  Yes Volney American, PA-C  promethazine-dextromethorphan (PROMETHAZINE-DM) 6.25-15 MG/5ML syrup Take 5 mLs by mouth 4 (four) times daily as needed. 04/23/21  Yes Volney American, PA-C  acetaminophen (TYLENOL) 500 MG tablet Take 500-1,000 mg by mouth every 6 (six) hours as needed (for headaches/pain.).    [provider]  alendronate (FOSAMAX) 70 MG tablet TAKE 1 TABLET BY MOUTH ONCE A WEEK ON AN EMPTY STOMACH WITH  A  FULL  GLASS  OF  WATER 02/27/21   Lacinda Axon, Newton G, DO   ALPRAZolam Duanne Moron) 0.5 MG tablet TAKE 1/2 (ONE-HALF) TABLET BY MOUTH TWICE DAILY AS NEEDED FOR ANXIETY 03/14/21   Coral Spikes, DO  atorvastatin (LIPITOR) 80 MG tablet Take one tablet po daily 02/27/21   Cook, Derby G, DO  calcium-vitamin D (OSCAL WITH D) 500-200 MG-UNIT per tablet Take 1 tablet by mouth daily.     [provider]  docusate sodium (COLACE) 50 MG capsule Take 50 mg by mouth daily as needed for mild constipation.     [provider]  enalapril (VASOTEC) 10 MG tablet Take 1 tablet (10 mg total) by mouth at bedtime. 03/02/21   Coral Spikes, DO  famotidine (PEPCID) 40 MG tablet Take 1 tablet (40 mg total) by mouth daily. Prn acid reflux 02/29/20   Elvia Collum M, DO  meclizine (ANTIVERT) 25 MG tablet TAKE 1 TABLET BY MOUTH TWICE DAILY AS NEEDED FOR DIZZINESS 11/22/20   Lovena Le, Malena M, DO  VENTOLIN HFA 108 (90 Base) MCG/ACT inhaler INHALE 2 PUFFS BY MOUTH EVERY 6 HOURS AS NEEDED FOR WHEEZING FOR SHORTNESS OF BREATH 03/02/21   Coral Spikes, DO    Family History Family History  Problem Relation Age of Onset   Hypertension Father    Diabetes type II Father    Cancer Father    Hypertension Mother    COPD Mother    Hypertension Son     Social History Social History   Tobacco Use   Smoking status: Never   Smokeless tobacco: Never  Vaping Use   Vaping Use: Never used  Substance Use Topics   Alcohol use: No   Drug use: No     Allergies   Levofloxacin, Other, and Penicillins   Review of Systems Review of Systems Per HPI  Physical Exam Triage Vital Signs ED Triage Vitals  Enc Vitals Group     BP 04/23/21 0825 113/74     Pulse Rate 04/23/21 0825 (!) 120     Resp 04/23/21 0825 16     Temp 04/23/21 0825 98.5 F (36.9 C)     Temp Source 04/23/21 0825 Oral     SpO2 04/23/21 0825 93 %     Weight --      Height --      Head Circumference --      Peak Flow --      Pain Score 04/23/21 0823 0     Pain Loc --      Pain Edu? --      Excl. in  [place]? --    No data found.  Updated Vital Signs BP 113/74 (BP Location: Right Arm)  Pulse (!) 120    Temp 98.5 F (36.9 C) (Oral)    Resp 16    SpO2 93%   Visual Acuity Right Eye Distance:   Left Eye Distance:   Bilateral Distance:    Right Eye Near:   Left Eye Near:    Bilateral Near:     Physical Exam Vitals and nursing note reviewed.  Constitutional:      Appearance: Normal appearance.  HENT:     Head: Atraumatic.     Right Ear: Tympanic membrane and external ear normal.     Left Ear: Tympanic membrane and external ear normal.     Nose: Rhinorrhea present.     Mouth/Throat:     Mouth: Mucous membranes are moist.     Pharynx: Posterior oropharyngeal erythema present.  Eyes:     Extraocular Movements: Extraocular movements intact.     Conjunctiva/sclera: Conjunctivae normal.  Cardiovascular:     Rate and Rhythm: Normal rate and regular rhythm.     Heart sounds: Normal heart sounds.  Pulmonary:     Effort: Pulmonary effort is normal.     Breath sounds: Wheezing present. No rales.     Comments: Mild diffuse wheezes bilaterally Musculoskeletal:        General: Normal range of motion.     Cervical back: Normal range of motion and neck supple.  Skin:    General: Skin is warm and dry.  Neurological:     Mental Status: She is alert and oriented to person, place, and time.     Motor: No weakness.     Gait: Gait normal.  Psychiatric:        Mood and Affect: Mood normal.        Thought Content: Thought content normal.     UC Treatments / Results  Labs (all labs ordered are listed, but only abnormal results are displayed) Labs Reviewed  COVID-19, FLU A+B NAA    EKG   Radiology No results found.  Procedures Procedures (including critical care time)  Medications Ordered in UC Medications - No data to display  Initial Impression / Assessment and Plan / UC Course  I have reviewed the triage vital signs and the nursing notes.  Pertinent labs & imaging  results that were available during my care of the patient were reviewed by me and considered in my medical decision making (see chart for details).     Mildly tachycardic in triage, otherwise vital signs reassuring.  Suspect viral upper respiratory infection causing secondary asthma exacerbation.  We will treat with prednisone, Phenergan DM, supportive over-the-counter medications and home care while awaiting COVID and flu testing.  Return for acutely worsening symptoms.  Final Clinical Impressions(s) / UC Diagnoses   Final diagnoses:  Exposure to the flu  Viral URI with cough  Mild intermittent asthma with acute exacerbation   Discharge Instructions   None    ED Prescriptions     Medication Sig Dispense Auth. Provider   predniSONE (DELTASONE) 20 MG tablet Take 2 tablets (40 mg total) by mouth daily with breakfast. 10 tablet Volney American, PA-C   promethazine-dextromethorphan (PROMETHAZINE-DM) 6.25-15 MG/5ML syrup Take 5 mLs by mouth 4 (four) times daily as needed. 100 mL Volney American, [place]      PDMP not reviewed this encounter.   Merrie Roof [place], [place] 04/23/21 [phone]

## 2021-04-26 ENCOUNTER — Ambulatory Visit (INDEPENDENT_AMBULATORY_CARE_PROVIDER_SITE_OTHER): Payer: Medicare Other | Admitting: Family Medicine

## 2021-04-26 ENCOUNTER — Other Ambulatory Visit: Payer: Self-pay

## 2021-04-26 ENCOUNTER — Encounter: Payer: Self-pay | Admitting: Family Medicine

## 2021-04-26 VITALS — BP 148/90 | HR 114 | Temp 98.4°F | Ht 63.0 in | Wt 157.0 lb

## 2021-04-26 DIAGNOSIS — J209 Acute bronchitis, unspecified: Secondary | ICD-10-CM | POA: Diagnosis not present

## 2021-04-26 MED ORDER — HYDROCODONE BIT-HOMATROP MBR 5-1.5 MG/5ML PO SOLN: 5.0000 mL | Freq: Three times a day (TID) | ORAL | 0 refills | Status: DC | PRN

## 2021-04-26 MED ORDER — DOXYCYCLINE HYCLATE 100 MG PO TABS: 100.0000 mg | ORAL_TABLET | Freq: Two times a day (BID) | ORAL | 0 refills | Status: DC

## 2021-04-26 NOTE — Assessment & Plan Note (Signed)
Treating with doxycycline and Hycodan.

## 2021-04-26 NOTE — Progress Notes (Signed)
Subjective:  Patient ID: Breanna Mason, female    DOB: Dec 19, 1945  Age: 75 y.o. MRN: [id number]  CC: Chief Complaint  Patient presents with   Cough    Congestion prod yellow phlegm, wheezing, believes its bronchitis UC on Sunday given prednisone, promethazine neither help    HPI:  75 year old female presents with respiratory symptoms.  Symptoms started approximate 1 week ago.  She was seen at urgent care on 12/18.  Was treated with prednisone and Promethazine DM.  Patient states she continues to be symptomatic.  Not improving.  Patient is concerned that she has acute bronchitis.  She has a history of this.  No fever.  Cough is very productive.  Cough interfering with sleep.  No other respiratory symptoms.  No other complaints.  Patient Active Problem List   Diagnosis Date Noted   Acute bronchitis 04/26/2021   Anxiety 03/03/2021   Osteoporosis without current pathological fracture 08/29/2020   Prediabetes 09/08/2017   Rotator cuff tear arthropathy of right shoulder 03/12/2014   S/P shoulder replacement 03/11/2014   Esophageal reflux 09/07/2012   Essential hypertension, benign 03/06/2011   Mixed hyperlipidemia 03/06/2011   Asthma 03/06/2011    Social Hx   Social History   Socioeconomic History   Marital status: Married    Spouse name: Not on file   Number of children: Not on file   Years of education: Not on file   Highest education level: Not on file  Occupational History   Occupation: Engineering geologist    Comment: Retired  Tobacco Use   Smoking status: Never   Smokeless tobacco: Never  Vaping Use   Vaping Use: Never used  Substance and Sexual Activity   Alcohol use: No   Drug use: No   Sexual activity: Not Currently    Birth control/protection: Post-menopausal  Other Topics Concern   Not on file  Social History Narrative   Not on file   Social Determinants of Health   Financial Resource Strain: Not on file  Food Insecurity: Not on file  Transportation  Needs: Not on file  Physical Activity: Not on file  Stress: Not on file  Social Connections: Not on file    Review of Systems  Constitutional:  Negative for fever.  Respiratory:  Positive for cough.   Per HPI  Objective:  BP (!) 148/90    Pulse (!) 114    Temp 98.4 F (36.9 C)    Ht 5\' 3"  (1.6 m)    Wt 157 lb (71.2 kg)    SpO2 97%    BMI 27.81 kg/m   BP/Weight 04/26/2021 04/23/2021 33/29/5188  Systolic BP 416 606 301  Diastolic BP 90 74 83  Wt. (Lbs) 157 - 156.4  BMI 27.81 - 27.71    Physical Exam Vitals and nursing note reviewed.  Constitutional:      General: She is not in acute distress.    Appearance: Normal appearance. She is not ill-appearing.  HENT:     Head: Normocephalic and atraumatic.  Cardiovascular:     Rate and Rhythm: Normal rate and regular rhythm.     Heart sounds: No murmur heard. Pulmonary:     Effort: Pulmonary effort is normal.     Breath sounds: Normal breath sounds. No wheezing or rales.  Neurological:     Mental Status: She is alert.  Psychiatric:        Mood and Affect: Mood normal.        Behavior: Behavior normal.  Lab Results  Component Value Date   WBC 5.1 02/23/2021   HGB 15.5 02/23/2021   HCT 45.0 02/23/2021   PLT 241 02/23/2021   GLUCOSE 113 (H) 02/23/2021   CHOL 196 02/23/2021   TRIG 101 02/23/2021   HDL 55 02/23/2021   LDLCALC 123 (H) 02/23/2021   ALT 29 02/23/2021   AST 18 02/23/2021   NA 141 02/23/2021   K 4.6 02/23/2021   CL 104 02/23/2021   CREATININE 0.76 02/23/2021   BUN 24 02/23/2021   CO2 23 02/23/2021   TSH 2.320 02/11/2019   HGBA1C 5.7 (H) 02/23/2021     Assessment & Plan:   Problem List Items Addressed This Visit       Respiratory   Acute bronchitis - Primary    Treating with doxycycline and Hycodan.       Meds ordered this encounter  Medications   doxycycline (VIBRA-TABS) 100 MG tablet    Sig: Take 1 tablet (100 mg total) by mouth 2 (two) times daily.    Dispense:  20 tablet    Refill:   0   DISCONTD: HYDROcodone bit-homatropine (HYCODAN) 5-1.5 MG/5ML syrup    Sig: Take 5 mLs by mouth every 8 (eight) hours as needed for cough.    Dispense:  120 mL    Refill:  0   HYDROcodone bit-homatropine (HYCODAN) 5-1.5 MG/5ML syrup    Sig: Take 5 mLs by mouth every 8 (eight) hours as needed for cough.    Dispense:  120 mL    Refill:  0    Follow-up:  PRN  [place]

## 2021-05-11 DIAGNOSIS — D225 Melanocytic nevi of trunk: Secondary | ICD-10-CM | POA: Diagnosis not present

## 2021-05-11 DIAGNOSIS — D485 Neoplasm of uncertain behavior of skin: Secondary | ICD-10-CM | POA: Diagnosis not present

## 2021-05-11 DIAGNOSIS — Z1283 Encounter for screening for malignant neoplasm of skin: Secondary | ICD-10-CM | POA: Diagnosis not present

## 2021-05-21 ENCOUNTER — Other Ambulatory Visit: Payer: Self-pay | Admitting: Family Medicine

## 2021-05-24 DIAGNOSIS — L988 Other specified disorders of the skin and subcutaneous tissue: Secondary | ICD-10-CM | POA: Diagnosis not present

## 2021-05-24 DIAGNOSIS — D485 Neoplasm of uncertain behavior of skin: Secondary | ICD-10-CM | POA: Diagnosis not present

## 2021-05-30 ENCOUNTER — Other Ambulatory Visit: Payer: Self-pay

## 2021-05-30 ENCOUNTER — Ambulatory Visit (INDEPENDENT_AMBULATORY_CARE_PROVIDER_SITE_OTHER): Payer: Medicare HMO

## 2021-05-30 VITALS — Ht 63.0 in | Wt 157.0 lb

## 2021-05-30 DIAGNOSIS — Z0000 Encounter for general adult medical examination without abnormal findings: Secondary | ICD-10-CM

## 2021-05-30 NOTE — Patient Instructions (Signed)
Breanna Mason , Thank you for taking time to come for your Medicare Wellness Visit. I appreciate your ongoing commitment to your health goals. Please review the following plan we discussed and let me know if I can assist you in the future.   Screening recommendations/referrals: Colonoscopy: Done 09/16/2017. Repeat due in 2026. Mammogram: Done 03/09/2021 Repeat annually  Bone Density: Done 04/06/2021 Repeat every 2 years  Recommended yearly ophthalmology/optometry visit for glaucoma screening and checkup Recommended yearly dental visit for hygiene and checkup  Vaccinations: Influenza vaccine: Done 02/03/2021 Repeat annually  Pneumococcal vaccine: Done 09/04/2013 and 03/06/2016 Tdap vaccine: Done 09/04/2013 Repeat in 10 years  Shingles vaccine: Done 01/15/2008. Shingrix discussed. Please contact your pharmacy for coverage information.     Covid-19:Done 07/10/2019 and 08/11/2019  Advanced directives: Please bring a copy of your health care power of attorney and living will to the office to be added to your chart at your convenience.   Conditions/risks identified: Aim for 30 minutes of exercise or brisk walking each day, drink 6-8 glasses of water and eat lots of fruits and vegetables. KEEP UP THE GOOD WORK!  Next appointment: Follow up in one year for your annual wellness visit 2024   Preventive Care 65 Years and Older, Female Preventive care refers to lifestyle choices and visits with your health care provider that can promote health and wellness. What does preventive care include? A yearly physical exam. This is also called an annual well check. Dental exams once or twice a year. Routine eye exams. Ask your health care provider how often you should have your eyes checked. Personal lifestyle choices, including: Daily care of your teeth and gums. Regular physical activity. Eating a healthy diet. Avoiding tobacco and drug use. Limiting alcohol use. Practicing safe sex. Taking low-dose  aspirin every day. Taking vitamin and mineral supplements as recommended by your health care provider. What happens during an annual well check? The services and screenings done by your health care provider during your annual well check will depend on your age, overall health, lifestyle risk factors, and family history of disease. Counseling  Your health care provider may ask you questions about your: Alcohol use. Tobacco use. Drug use. Emotional well-being. Home and relationship well-being. Sexual activity. Eating habits. History of falls. Memory and ability to understand (cognition). Work and work Statistician. Reproductive health. Screening  You may have the following tests or measurements: Height, weight, and BMI. Blood pressure. Lipid and cholesterol levels. These may be checked every 5 years, or more frequently if you are over 7 years old. Skin check. Lung cancer screening. You may have this screening every year starting at age 75 if you have a 30-pack-year history of smoking and currently smoke or have quit within the past 15 years. Fecal occult blood test (FOBT) of the stool. You may have this test every year starting at age 75. Flexible sigmoidoscopy or colonoscopy. You may have a sigmoidoscopy every 5 years or a colonoscopy every 10 years starting at age 1. Hepatitis C blood test. Hepatitis B blood test. Sexually transmitted disease (STD) testing. Diabetes screening. This is done by checking your blood sugar (glucose) after you have not eaten for a while (fasting). You may have this done every 1-3 years. Bone density scan. This is done to screen for osteoporosis. You may have this done starting at age 75 Mammogram. This may be done every 1-2 years. Talk to your health care provider about how often you should have regular mammograms. Talk with your health  care provider about your test results, treatment options, and if necessary, the need for more tests. Vaccines  Your  health care provider may recommend certain vaccines, such as: Influenza vaccine. This is recommended every year. Tetanus, diphtheria, and acellular pertussis (Tdap, Td) vaccine. You may need a Td booster every 10 years. Zoster vaccine. You may need this after age 75. Pneumococcal 13-valent conjugate (PCV13) vaccine. One dose is recommended after age 75. Pneumococcal polysaccharide (PPSV23) vaccine. One dose is recommended after age 75. Talk to your health care provider about which screenings and vaccines you need and how often you need them. This information is not intended to replace advice given to you by your health care provider. Make sure you discuss any questions you have with your health care provider. Document Released: 05/20/2015 Document Revised: 01/11/2016 Document Reviewed: 02/22/2015 Elsevier Interactive Patient Education  2017 [place] Prevention in the Home Falls can cause injuries. They can happen to people of all ages. There are many things you can do to make your home safe and to help prevent falls. What can I do on the outside of my home? Regularly fix the edges of walkways and driveways and fix any cracks. Remove anything that might make you trip as you walk through a door, such as a raised step or threshold. Trim any bushes or trees on the path to your home. Use bright outdoor lighting. Clear any walking paths of anything that might make someone trip, such as rocks or tools. Regularly check to see if handrails are loose or broken. Make sure that both sides of any steps have handrails. Any raised decks and porches should have guardrails on the edges. Have any leaves, snow, or ice cleared regularly. Use sand or salt on walking paths during winter. Clean up any spills in your garage right away. This includes oil or grease spills. What can I do in the bathroom? Use night lights. Install grab bars by the toilet and in the tub and shower. Do not use towel bars as  grab bars. Use non-skid mats or decals in the tub or shower. If you need to sit down in the shower, use a plastic, non-slip stool. Keep the floor dry. Clean up any water that spills on the floor as soon as it happens. Remove soap buildup in the tub or shower regularly. Attach bath mats securely with double-sided non-slip rug tape. Do not have throw rugs and other things on the floor that can make you trip. What can I do in the bedroom? Use night lights. Make sure that you have a light by your bed that is easy to reach. Do not use any sheets or blankets that are too big for your bed. They should not hang down onto the floor. Have a firm chair that has side arms. You can use this for support while you get dressed. Do not have throw rugs and other things on the floor that can make you trip. What can I do in the kitchen? Clean up any spills right away. Avoid walking on wet floors. Keep items that you use a lot in easy-to-reach places. If you need to reach something above you, use a strong step stool that has a grab bar. Keep electrical cords out of the way. Do not use floor polish or wax that makes floors slippery. If you must use wax, use non-skid floor wax. Do not have throw rugs and other things on the floor that can make you trip. What can  I do with my stairs? Do not leave any items on the stairs. Make sure that there are handrails on both sides of the stairs and use them. Fix handrails that are broken or loose. Make sure that handrails are as long as the stairways. Check any carpeting to make sure that it is firmly attached to the stairs. Fix any carpet that is loose or worn. Avoid having throw rugs at the top or bottom of the stairs. If you do have throw rugs, attach them to the floor with carpet tape. Make sure that you have a light switch at the top of the stairs and the bottom of the stairs. If you do not have them, ask someone to add them for you. What else can I do to help prevent  falls? Wear shoes that: Do not have high heels. Have rubber bottoms. Are comfortable and fit you well. Are closed at the toe. Do not wear sandals. If you use a stepladder: Make sure that it is fully opened. Do not climb a closed stepladder. Make sure that both sides of the stepladder are locked into place. Ask someone to hold it for you, if possible. Clearly mark and make sure that you can see: Any grab bars or handrails. First and last steps. Where the edge of each step is. Use tools that help you move around (mobility aids) if they are needed. These include: Canes. Walkers. Scooters. Crutches. Turn on the lights when you go into a dark area. Replace any light bulbs as soon as they burn out. Set up your furniture so you have a clear path. Avoid moving your furniture around. If any of your floors are uneven, fix them. If there are any pets around you, be aware of where they are. Review your medicines with your doctor. Some medicines can make you feel dizzy. This can increase your chance of falling. Ask your doctor what other things that you can do to help prevent falls. This information is not intended to replace advice given to you by your health care provider. Make sure you discuss any questions you have with your health care provider. Document Released: 02/17/2009 Document Revised: 09/29/2015 Document Reviewed: 05/28/2014 Elsevier Interactive Patient Education  2017 [employer].

## 2021-05-30 NOTE — Progress Notes (Signed)
Subjective:   Breanna Mason is a 76 y.o. female who presents for an Initial Medicare Annual Wellness Visit. Virtual Visit via Telephone Note  I connected with  [name] on 05/30/21 at  3:40 PM EST by telephone and verified that I am speaking with the correct person using two identifiers.  Location: Patient: HOME Provider: [facility] Persons participating in the virtual visit: patient/Nurse Health Advisor   I discussed the limitations, risks, security and privacy concerns of performing an evaluation and management service by telephone and the availability of in person appointments. The patient expressed understanding and agreed to proceed.  Interactive audio and video telecommunications were attempted between this nurse and patient, however failed, due to patient having technical difficulties OR patient did not have access to video capability.  We continued and completed visit with audio only.  Some vital signs may be absent or patient reported.   Chriss Driver, LPN  Review of Systems     Cardiac Risk Factors include: advanced age (>2men, >57 women);dyslipidemia;hypertension;sedentary lifestyle  PHONE VISIT. PT AT HOME. NURSE AT [facility].    Objective:    Today's Vitals   05/30/21 1534  Weight: 157 lb (71.2 kg)  Height: 5\' 3"  (1.6 m)   Body mass index is 27.81 kg/m.  Advanced Directives 05/30/2021 08/21/2019 09/16/2017 07/01/2017 06/17/2017 06/12/2017 04/10/2014  Does Patient Have a Medical Advance Directive? Yes No No Yes Yes Yes No  Type of Paramedic of [place];Living will - - - Special educational needs teacher of [place] in Chart? No - copy requested - - - No - copy requested No - copy requested -  Would patient like information on creating a medical advance directive? - No - Patient declined No - Patient declined - - - No - patient declined information  Pre-existing out of facility DNR order  (yellow form or pink MOST form) - - - - - - -    Current Medications (verified) Outpatient Encounter Medications as of 05/30/2021  Medication Sig   acetaminophen (TYLENOL) 500 MG tablet Take 500-1,000 mg by mouth every 6 (six) hours as needed (for headaches/pain.).   alendronate (FOSAMAX) 70 MG tablet TAKE 1 TABLET BY MOUTH ONCE A WEEK ON  AN  EMPTY  STOMACH  WITH  A  FULL  GLASS  OF  WATER   ALPRAZolam (XANAX) 0.5 MG tablet TAKE 1/2 (ONE-HALF) TABLET BY MOUTH TWICE DAILY AS NEEDED FOR ANXIETY   atorvastatin (LIPITOR) 80 MG tablet Take one tablet po daily   calcium-vitamin D (OSCAL WITH D) 500-200 MG-UNIT per tablet Take 1 tablet by mouth daily.    docusate sodium (COLACE) 50 MG capsule Take 50 mg by mouth daily as needed for mild constipation.    doxycycline (VIBRA-TABS) 100 MG tablet Take 1 tablet (100 mg total) by mouth 2 (two) times daily.   enalapril (VASOTEC) 10 MG tablet Take 1 tablet (10 mg total) by mouth at bedtime.   famotidine (PEPCID) 40 MG tablet Take 1 tablet (40 mg total) by mouth daily. Prn acid reflux   HYDROcodone bit-homatropine (HYCODAN) 5-1.5 MG/5ML syrup Take 5 mLs by mouth every 8 (eight) hours as needed for cough.   meclizine (ANTIVERT) 25 MG tablet TAKE 1 TABLET BY MOUTH TWICE DAILY AS NEEDED FOR DIZZINESS   mupirocin ointment (BACTROBAN) 2 % Apply topically.   predniSONE (DELTASONE) 20 MG tablet Take 2 tablets (40 mg total) by mouth daily  with breakfast.   promethazine-dextromethorphan (PROMETHAZINE-DM) 6.25-15 MG/5ML syrup Take 5 mLs by mouth 4 (four) times daily as needed.   VENTOLIN HFA 108 (90 Base) MCG/ACT inhaler INHALE 2 PUFFS BY MOUTH EVERY 6 HOURS AS NEEDED FOR WHEEZING FOR SHORTNESS OF BREATH   No facility-administered encounter medications on file as of 05/30/2021.    Allergies (verified) Levofloxacin, Other, and Penicillins   History: Past Medical History:  Diagnosis Date   Allergy    Anxiety    Arthritis    Asthma    Essential hypertension,  benign    GERD (gastroesophageal reflux disease)    IFG (impaired fasting glucose)    Impaired glucose tolerance    Mixed hyperlipidemia    Osteopenia    Osteoporosis    Pneumonia    hx   PONV (postoperative nausea and vomiting)    Reflux    Past Surgical History:  Procedure Laterality Date   Bilateral tubal ligation     CARPAL TUNNEL RELEASE Right    30 yrs   CATARACT EXTRACTION W/PHACO Right 06/17/2017   Procedure: CATARACT EXTRACTION PHACO AND INTRAOCULAR LENS PLACEMENT RIGHT EYE;  Surgeon: Tonny Branch, MD;  Location: [facility];  Service: Ophthalmology;  Laterality: Right;  CDE: 17.57   CATARACT EXTRACTION W/PHACO Left 07/01/2017   Procedure: CATARACT EXTRACTION PHACO AND INTRAOCULAR LENS PLACEMENT (IOC);  Surgeon: Tonny Branch, MD;  Location: [facility];  Service: Ophthalmology;  Laterality: Left;  CDE: 17.35   CESAREAN SECTION     COLONOSCOPY N/A 09/17/2013   Procedure: COLONOSCOPY;  Surgeon: Rogene Houston, MD;  [facility]: [facility];  Service: Endoscopy;  Laterality: N/A;  1200   COLONOSCOPY N/A 09/16/2017   Procedure: COLONOSCOPY;  Surgeon: Rogene Houston, MD;  [facility]: [facility];  Service: Endoscopy;  Laterality: N/A;  8:55   HYSTEROSCOPY WITH D & C N/A 08/25/2019   Procedure: DILATATION AND CURETTAGE /HYSTEROSCOPY;  Surgeon: Jonnie Kind, MD;  Location: [facility];  Service: Gynecology;  Laterality: N/A;   POLYPECTOMY N/A 08/25/2019   Procedure: ENDOMETRIAL POLYPECTOMY;  Surgeon: Jonnie Kind, MD;  Location: [facility];  Service: Gynecology;  Laterality: N/A;   REVERSE SHOULDER ARTHROPLASTY Right 03/11/2014   Procedure: RIGHT REVERSE SHOULDER ARTHROPLASTY;  Surgeon: Marin Shutter, MD;  Location: [facility];  Service: Orthopedics;  Laterality: Right;   SHOULDER SURGERY Right    x4   TUBAL LIGATION     Family History  Problem Relation Age of Onset   Hypertension Father    Diabetes type II Father    Cancer Father    Hypertension Mother    COPD Mother    Hypertension Son     Social History   Socioeconomic History   Marital status: Married    Spouse name: Not on file   Number of children: Not on file   Years of education: Not on file   Highest education level: Not on file  Occupational History   Occupation: Engineering geologist    Comment: Retired  Tobacco Use   Smoking status: Never   Smokeless tobacco: Never  Vaping Use   Vaping Use: Never used  Substance and Sexual Activity   Alcohol use: No   Drug use: No   Sexual activity: Not Currently    Birth control/protection: Post-menopausal  Other Topics Concern   Not on file  Social History Narrative   Not on file   Social Determinants of Health   Financial Resource Strain: Low Risk    Difficulty of  Paying Living Expenses: Not very hard  Food Insecurity: No Food Insecurity   Worried About [place] in the Last Year: Never true   Ran Out of Food in the Last Year: Never true  Transportation Needs: No Transportation Needs   Lack of Transportation (Medical): No   Lack of Transportation (Non-Medical): No  Physical Activity: Insufficiently Active   Days of Exercise per Week: 3 days   Minutes of Exercise per Session: 30 min  Stress: No Stress Concern Present   Feeling of Stress : Not at all  Social Connections: Socially Integrated   Frequency of Communication with Friends and Family: More than three times a week   Frequency of Social Gatherings with Friends and Family: More than three times a week   Attends Religious Services: More than 4 times per year   Active Member of [employer] or Organizations: Yes   Attends Music therapist: More than 4 times per year   Marital Status: Married    Tobacco Counseling Counseling given: Not Answered   Clinical Intake:  Pre-visit preparation completed: Yes  Pain : No/denies pain     BMI - recorded: 27.81 Nutritional Status: BMI 25 -29 Overweight Nutritional Risks: None Diabetes: No  How often do you need to have someone help you  when you read instructions, pamphlets, or other written materials from your doctor or pharmacy?: 1 - Never  Diabetic?NO  Interpreter Needed?: No  Information entered by :: MJ , LPN   Activities of Daily Living In your present state of health, do you have any difficulty performing the following activities: 05/30/2021  Hearing? N  Vision? N  Difficulty concentrating or making decisions? N  Walking or climbing stairs? N  Dressing or bathing? N  Doing errands, shopping? N  Preparing Food and eating ? N  Using the Toilet? N  In the past six months, have you accidently leaked urine? Y  Comment stress incontinence.  Do you have problems with loss of bowel control? N  Managing your Medications? N  Managing your Finances? N  Housekeeping or managing your Housekeeping? N  Some recent data might be hidden    Patient Care Team: Coral Spikes, DO as PCP - General (Family Medicine)  Indicate any recent Medical Services you may have received from other than [facility] providers in the past year (date may be approximate).     Assessment:   This is a routine wellness examination for Alexiss.  Hearing/Vision screen Hearing Screening - Comments:: No hearing issues.  Vision Screening - Comments:: Glasses. [facility]  Dietary issues and exercise activities discussed: Current Exercise Habits: Home exercise routine, Type of exercise: walking, Time (Minutes): 30, Frequency (Times/Week): 3, Weekly Exercise (Minutes/Week): 90, Intensity: Mild, Exercise limited by: cardiac condition(s);respiratory conditions(s)   Goals Addressed             This Visit's Progress    Exercise 3x per week (30 min per time)       Increase exercise as tolerated.        Depression Screen PHQ 2/9 Scores 05/30/2021 03/02/2021 10/28/2020 08/29/2020 08/29/2020 05/09/2017  PHQ - 2 Score 0 0 0 0 0 0    Fall Risk Fall Risk  05/30/2021 03/02/2021 10/28/2020 08/29/2020 08/29/2020  Falls in the past year? 0 0 0 0 0   Comment - - - - -  Number falls in past yr: 0 - 0 0 0  Comment - - - - -  Injury with  Fall? 0 - 0 0 0  Risk for fall due to : No Fall Risks No Fall Risks No Fall Risks - -  Follow up Falls prevention discussed Falls evaluation completed Falls evaluation completed - Falls evaluation completed    [place]:  Any stairs in or around the home? Yes  If so, are there any without handrails? No  Home free of loose throw rugs in walkways, pet beds, electrical cords, etc? Yes  Adequate lighting in your home to reduce risk of falls? Yes   ASSISTIVE DEVICES UTILIZED TO PREVENT FALLS:  Life alert? No  Use of a cane, walker or w/c? No  Grab bars in the bathroom? Yes  Shower chair or bench in shower? Yes  Elevated toilet seat or a handicapped toilet? Yes   TIMED UP AND GO:  Was the test performed? No .  Phone visit.  Cognitive Function:     6CIT Screen 05/30/2021  What Year? 0 points  What month? 0 points  What time? 0 points  Count back from 20 0 points  Months in reverse 0 points  Repeat phrase 0 points  Total Score 0    Immunizations Immunization History  Administered Date(s) Administered   Fluad Quad(high Dose 65+) 02/29/2020   Influenza,inj,Quad PF,6+ Mos 02/23/2014, 01/29/2017, 03/10/2018, 02/10/2019   Influenza-Unspecified 03/07/2012, 02/13/2013, 02/09/2015, 01/23/2016, 02/03/2021   Moderna Sars-Covid-2 Vaccination 07/10/2019, 08/11/2019   Pneumococcal Conjugate-13 09/04/2013   Pneumococcal Polysaccharide-23 01/05/2010, 03/06/2016   Td 09/04/2013   Zoster, Live 01/15/2008    TDAP status: Up to date  Flu Vaccine status: Up to date  Pneumococcal vaccine status: Up to date  Covid-19 vaccine status: Completed vaccines  Qualifies for Shingles Vaccine? Yes   Zostavax completed Yes   Shingrix Completed?: No.    Education has been provided regarding the importance of this vaccine. Patient has been advised to call insurance company to  determine out of pocket expense if they have not yet received this vaccine. Advised may also receive vaccine at local pharmacy or Health Dept. Verbalized acceptance and understanding.  Screening Tests Health Maintenance  Topic Date Due   Hepatitis C Screening  Never done   Zoster Vaccines- Shingrix (1 of 2) Never done   MAMMOGRAM  03/10/2023   TETANUS/TDAP  09/05/2023   COLONOSCOPY (Pts 45-2yrs Insurance coverage will need to be confirmed)  09/16/2024   Pneumonia Vaccine 35+ Years old  Completed   INFLUENZA VACCINE  Completed   DEXA SCAN  Completed   HPV VACCINES  Aged Out   COVID-19 Vaccine  Discontinued    Health Maintenance  Health Maintenance Due  Topic Date Due   Hepatitis C Screening  Never done   Zoster Vaccines- Shingrix (1 of 2) Never done    Colorectal cancer screening: Type of screening: Colonoscopy. Completed 09/16/2017. Repeat every 7 years  Mammogram status: Completed 03/09/2021. Repeat every year  Bone Density status: Completed 04/06/2021. Results reflect: Bone density results: OSTEOPOROSIS. Repeat every 2 years.  Lung Cancer Screening: (Low Dose CT Chest recommended if Age 66-80 years, 30 pack-year currently smoking OR have quit w/in 15years.) does not qualify.   Additional Screening:  Hepatitis C Screening: does qualify; Completed due  Vision Screening: Recommended annual ophthalmology exams for early detection of glaucoma and other disorders of the eye. Is the patient up to date with their annual eye exam?  Yes  Who is the provider or what is the name of the office in which the patient  attends annual eye exams? [facility] If pt is not established with a provider, would they like to be referred to a provider to establish care? No .   Dental Screening: Recommended annual dental exams for proper oral hygiene  Community Resource Referral / Chronic Care Management: CRR required this visit?  No   CCM required this visit?  No      Plan:     I  have personally reviewed and noted the following in the patients chart:   Medical and social history Use of alcohol, tobacco or illicit drugs  Current medications and supplements including opioid prescriptions. Patient is not currently taking opioid prescriptions. Functional ability and status Nutritional status Physical activity Advanced directives List of other physicians Hospitalizations, surgeries, and ER visits in previous 12 months Vitals Screenings to include cognitive, depression, and falls Referrals and appointments  In addition, I have reviewed and discussed with patient certain preventive protocols, quality metrics, and best practice recommendations. A written personalized care plan for preventive services as well as general preventive health recommendations were provided to patient.     Chriss Driver, LPN   5/32/9924   Nurse Notes: Pt is up to date on all health maintenace and vaccines except for Shingrix. Discussed Shingrix and how to obtain. 6CIT score of 0.

## 2021-06-06 ENCOUNTER — Other Ambulatory Visit: Payer: Self-pay | Admitting: Family Medicine

## 2021-06-29 DIAGNOSIS — H5200 Hypermetropia, unspecified eye: Secondary | ICD-10-CM | POA: Diagnosis not present

## 2021-08-07 ENCOUNTER — Telehealth: Payer: Self-pay | Admitting: *Deleted

## 2021-08-07 DIAGNOSIS — I10 Essential (primary) hypertension: Secondary | ICD-10-CM

## 2021-08-07 DIAGNOSIS — E782 Mixed hyperlipidemia: Secondary | ICD-10-CM

## 2021-08-07 DIAGNOSIS — Z79899 Other long term (current) drug therapy: Secondary | ICD-10-CM

## 2021-08-07 DIAGNOSIS — R7303 Prediabetes: Secondary | ICD-10-CM

## 2021-08-07 NOTE — Telephone Encounter (Signed)
Patient calling for labs before upcoming appt. Last labs 02/23/21: Lipid, CMP, HgbA1c, CBC ?

## 2021-08-08 NOTE — Telephone Encounter (Signed)
Coral Spikes, DO   ? ?Please order same labs.  ? ?Thank you  ? ?Dr. Lacinda Axon   ? ?

## 2021-08-08 NOTE — Telephone Encounter (Signed)
Blood work ordered in Epic. Patient notified. 

## 2021-08-11 ENCOUNTER — Other Ambulatory Visit: Payer: Self-pay | Admitting: Family Medicine

## 2021-08-24 DIAGNOSIS — K219 Gastro-esophageal reflux disease without esophagitis: Secondary | ICD-10-CM | POA: Diagnosis not present

## 2021-08-24 DIAGNOSIS — Z7983 Long term (current) use of bisphosphonates: Secondary | ICD-10-CM | POA: Diagnosis not present

## 2021-08-24 DIAGNOSIS — Z809 Family history of malignant neoplasm, unspecified: Secondary | ICD-10-CM | POA: Diagnosis not present

## 2021-08-24 DIAGNOSIS — R69 Illness, unspecified: Secondary | ICD-10-CM | POA: Diagnosis not present

## 2021-08-24 DIAGNOSIS — E785 Hyperlipidemia, unspecified: Secondary | ICD-10-CM | POA: Diagnosis not present

## 2021-08-24 DIAGNOSIS — R32 Unspecified urinary incontinence: Secondary | ICD-10-CM | POA: Diagnosis not present

## 2021-08-24 DIAGNOSIS — Z833 Family history of diabetes mellitus: Secondary | ICD-10-CM | POA: Diagnosis not present

## 2021-08-24 DIAGNOSIS — M810 Age-related osteoporosis without current pathological fracture: Secondary | ICD-10-CM | POA: Diagnosis not present

## 2021-08-24 DIAGNOSIS — I10 Essential (primary) hypertension: Secondary | ICD-10-CM | POA: Diagnosis not present

## 2021-08-24 DIAGNOSIS — Z008 Encounter for other general examination: Secondary | ICD-10-CM | POA: Diagnosis not present

## 2021-08-24 DIAGNOSIS — Z880 Allergy status to penicillin: Secondary | ICD-10-CM | POA: Diagnosis not present

## 2021-08-24 DIAGNOSIS — J45909 Unspecified asthma, uncomplicated: Secondary | ICD-10-CM | POA: Diagnosis not present

## 2021-08-25 DIAGNOSIS — R7303 Prediabetes: Secondary | ICD-10-CM | POA: Diagnosis not present

## 2021-08-25 DIAGNOSIS — Z79899 Other long term (current) drug therapy: Secondary | ICD-10-CM | POA: Diagnosis not present

## 2021-08-25 DIAGNOSIS — I10 Essential (primary) hypertension: Secondary | ICD-10-CM | POA: Diagnosis not present

## 2021-08-25 DIAGNOSIS — E782 Mixed hyperlipidemia: Secondary | ICD-10-CM | POA: Diagnosis not present

## 2021-08-26 LAB — COMPREHENSIVE METABOLIC PANEL
ALT: 32 IU/L (ref 0–32)
AST: 21 IU/L (ref 0–40)
Albumin/Globulin Ratio: 2 (ref 1.2–2.2)
Albumin: 4.3 g/dL (ref 3.7–4.7)
Alkaline Phosphatase: 64 IU/L (ref 44–121)
BUN/Creatinine Ratio: 26 (ref 12–28)
BUN: 18 mg/dL (ref 8–27)
Bilirubin Total: 0.5 mg/dL (ref 0.0–1.2)
CO2: 21 mmol/L (ref 20–29)
Calcium: 9.1 mg/dL (ref 8.7–10.3)
Chloride: 106 mmol/L (ref 96–106)
Creatinine, Ser: 0.68 mg/dL (ref 0.57–1.00)
Globulin, Total: 2.1 g/dL (ref 1.5–4.5)
Glucose: 106 mg/dL — ABNORMAL HIGH (ref 70–99)
Potassium: 4.4 mmol/L (ref 3.5–5.2)
Sodium: 141 mmol/L (ref 134–144)
Total Protein: 6.4 g/dL (ref 6.0–8.5)
eGFR: 91 mL/min/{1.73_m2} (ref >59)

## 2021-08-26 LAB — HEMOGLOBIN A1C
Est. average glucose Bld gHb Est-mCnc: 123 mg/dL
Hgb A1c MFr Bld: 5.9 % — ABNORMAL HIGH (ref 4.8–5.6)

## 2021-08-26 LAB — LIPID PANEL
Chol/HDL Ratio: 3.2 ratio (ref 0.0–4.4)
Cholesterol, Total: 177 mg/dL (ref 100–199)
HDL: 56 mg/dL (ref >39)
LDL Chol Calc (NIH): 102 mg/dL — ABNORMAL HIGH (ref 0–99)
Triglycerides: 107 mg/dL (ref 0–149)
VLDL Cholesterol Cal: 19 mg/dL (ref 5–40)

## 2021-08-26 LAB — CBC WITH DIFFERENTIAL/PLATELET
Basophils Absolute: 0 10*3/uL (ref 0.0–0.2)
Basos: 1 %
EOS (ABSOLUTE): 0.2 10*3/uL (ref 0.0–0.4)
Eos: 4 %
Hematocrit: 45.1 % (ref 34.0–46.6)
Hemoglobin: 15.4 g/dL (ref 11.1–15.9)
Immature Grans (Abs): 0 10*3/uL (ref 0.0–0.1)
Immature Granulocytes: 0 %
Lymphocytes Absolute: 1.5 10*3/uL (ref 0.7–3.1)
Lymphs: 27 %
MCH: 31.8 pg (ref 26.6–33.0)
MCHC: 34.1 g/dL (ref 31.5–35.7)
MCV: 93 fL (ref 79–97)
Monocytes Absolute: 0.8 10*3/uL (ref 0.1–0.9)
Monocytes: 14 %
Neutrophils Absolute: 3.2 10*3/uL (ref 1.4–7.0)
Neutrophils: 54 %
Platelets: 234 10*3/uL (ref 150–450)
RBC: 4.84 x10E6/uL (ref 3.77–5.28)
RDW: 12.9 % (ref 11.7–15.4)
WBC: 5.8 10*3/uL (ref 3.4–10.8)

## 2021-08-31 ENCOUNTER — Ambulatory Visit (INDEPENDENT_AMBULATORY_CARE_PROVIDER_SITE_OTHER): Payer: Medicare HMO | Admitting: Family Medicine

## 2021-08-31 DIAGNOSIS — I10 Essential (primary) hypertension: Secondary | ICD-10-CM

## 2021-08-31 DIAGNOSIS — R7303 Prediabetes: Secondary | ICD-10-CM

## 2021-08-31 DIAGNOSIS — E782 Mixed hyperlipidemia: Secondary | ICD-10-CM | POA: Diagnosis not present

## 2021-08-31 DIAGNOSIS — J4540 Moderate persistent asthma, uncomplicated: Secondary | ICD-10-CM | POA: Diagnosis not present

## 2021-08-31 MED ORDER — FLUTICASONE PROPIONATE HFA 220 MCG/ACT IN AERO: 1.0000 | INHALATION_SPRAY | Freq: Two times a day (BID) | RESPIRATORY_TRACT | 12 refills | Status: DC

## 2021-08-31 MED ORDER — FAMOTIDINE 40 MG PO TABS: 40.0000 mg | ORAL_TABLET | Freq: Every day | ORAL | 3 refills | Status: DC

## 2021-08-31 MED ORDER — ATORVASTATIN CALCIUM 40 MG PO TABS: 40.0000 mg | ORAL_TABLET | Freq: Every day | ORAL | 3 refills | Status: DC

## 2021-08-31 MED ORDER — ENALAPRIL MALEATE 10 MG PO TABS: 10.0000 mg | ORAL_TABLET | Freq: Every day | ORAL | 3 refills | Status: DC

## 2021-08-31 MED ORDER — MECLIZINE HCL 25 MG PO TABS: ORAL_TABLET | ORAL | 0 refills | Status: DC

## 2021-08-31 MED ORDER — ALENDRONATE SODIUM 70 MG PO TABS
ORAL_TABLET | ORAL | 3 refills | Status: DC
Start: 2021-08-31 — End: 2022-03-01

## 2021-08-31 NOTE — Assessment & Plan Note (Signed)
Stable.  Continue enalapril. 

## 2021-08-31 NOTE — Assessment & Plan Note (Signed)
Stable.  Will continue to monitor closely. 

## 2021-08-31 NOTE — Assessment & Plan Note (Signed)
-  Continue Lipitor °

## 2021-08-31 NOTE — Patient Instructions (Signed)
Continue your medications. ? ?I sent in an inhaler to help the asthma. ? ?Follow up in 6 months. ? ?Take care ? ?Dr. Lacinda Axon  ?

## 2021-08-31 NOTE — Assessment & Plan Note (Signed)
Uncontrolled. Adding Flovent. ?

## 2021-08-31 NOTE — Progress Notes (Signed)
? ?Subjective:  ?Patient ID: Breanna Mason, female    DOB: 07-13-1945  Age: 76 y.o. MRN: [id number] ? ?CC: ?Chief Complaint  ?Patient presents with  ? Hyperlipidemia  ? Hypertension  ? Medication Refill  ? ? ?HPI: ? ?76 year old female with HTN, Asthma, Osteoporosis, HLD, Prediabetes presents for follow up. ? ?HTN ?BP stable on Enalapril. ?Doing well at this time. ? ?HLD ?Stable on Lipitor. Would like slightly better control but patient does not tolerte increase in statin. ? ?Prediabetes ?Watching diet closely. ?Stable at this time (A1C 5.9). ? ?Asthma ?Reports frequent use of albuterol (daily). ?Was previously on Symbicort. Interested in starting controller medication. ? ?Patient Active Problem List  ? Diagnosis Date Noted  ? Anxiety 03/03/2021  ? Osteoporosis without current pathological fracture 08/29/2020  ? Prediabetes 09/08/2017  ? S/P shoulder replacement 03/11/2014  ? Esophageal reflux 09/07/2012  ? Essential hypertension, benign 03/06/2011  ? Mixed hyperlipidemia 03/06/2011  ? Asthma 03/06/2011  ? ? ?Social Hx   ?Social History  ? ?Socioeconomic History  ? Marital status: Married  ?  Spouse name: Not on file  ? Number of children: Not on file  ? Years of education: Not on file  ? Highest education level: Not on file  ?Occupational History  ? Occupation: [employer]  ?  Comment: Retired  ?Tobacco Use  ? Smoking status: Never  ? Smokeless tobacco: Never  ?Vaping Use  ? Vaping Use: Never used  ?Substance and Sexual Activity  ? Alcohol use: No  ? Drug use: No  ? Sexual activity: Not Currently  ?  Birth control/protection: Post-menopausal  ?Other Topics Concern  ? Not on file  ?Social History Narrative  ? Not on file  ? ?Social Determinants of Health  ? ?Financial Resource Strain: Low Risk   ? Difficulty of Paying Living Expenses: Not very hard  ?Food Insecurity: No Food Insecurity  ? Worried About [place] fund[place] in the Last Year: Never true  ? Ran Out of Food in the Last Year: Never true   ?Transportation Needs: No Transportation Needs  ? Lack of Transportation (Medical): No  ? Lack of Transportation (Non-Medical): No  ?Physical Activity: Insufficiently Active  ? Days of Exercise per Week: 3 days  ? Minutes of Exercise per Session: 30 min  ?Stress: No Stress Concern Present  ? Feeling of Stress : Not at all  ?Social Connections: Socially Integrated  ? Frequency of Communication with Friends and Family: More than three times a week  ? Frequency of Social Gatherings with Friends and Family: More than three times a week  ? Attends Religious Services: More than 4 times per year  ? Active Member of Clubs or Organizations: Yes  ? Attends Archivist Meetings: More than 4 times per year  ? Marital Status: Married  ? ? ?Review of Systems  ?Constitutional: Negative.   ?Respiratory:  Positive for shortness of breath and wheezing.   ? ?Objective:  ?BP 138/84   Pulse 93   Temp (!) 97.3 ?F (36.3 ?C)   Ht '5\' 3"'$  (1.6 m)   Wt 154 lb (69.9 kg)   SpO2 96%   BMI 27.28 kg/m?  ? ? ?  08/31/2021  ?  9:57 AM 05/30/2021  ?  3:34 PM 04/26/2021  ?  4:04 PM  ?BP/Weight  ?Systolic BP 505  697  ?Diastolic BP 84  90  ?Wt. (Lbs) 154 157 157  ?BMI 27.28 kg/m2 27.81 kg/m2 27.81 kg/m2  ? ? ?  Physical Exam ?Vitals and nursing note reviewed.  ?Constitutional:   ?   General: She is not in acute distress. ?   Appearance: Normal appearance. She is not ill-appearing.  ?HENT:  ?   Head: Normocephalic and atraumatic.  ?Eyes:  ?   General:     ?   Right eye: No discharge.     ?   Left eye: No discharge.  ?   Conjunctiva/sclera: Conjunctivae normal.  ?Cardiovascular:  ?   Rate and Rhythm: Normal rate and regular rhythm.  ?Pulmonary:  ?   Effort: Pulmonary effort is normal.  ?   Breath sounds: Normal breath sounds. No wheezing, rhonchi or rales.  ?Neurological:  ?   Mental Status: She is alert.  ?Psychiatric:     ?   Mood and Affect: Mood normal.     ?   Behavior: Behavior normal.  ? ? ?Lab Results  ?Component Value Date  ? WBC 5.8  08/25/2021  ? HGB 15.4 08/25/2021  ? HCT 45.1 08/25/2021  ? PLT 234 08/25/2021  ? GLUCOSE 106 (H) 08/25/2021  ? CHOL 177 08/25/2021  ? TRIG 107 08/25/2021  ? HDL 56 08/25/2021  ? LDLCALC 102 (H) 08/25/2021  ? ALT 32 08/25/2021  ? AST 21 08/25/2021  ? NA 141 08/25/2021  ? K 4.4 08/25/2021  ? CL 106 08/25/2021  ? CREATININE 0.68 08/25/2021  ? BUN 18 08/25/2021  ? CO2 21 08/25/2021  ? TSH 2.320 02/11/2019  ? HGBA1C 5.9 (H) 08/25/2021  ? ? ? ?Assessment & Plan:  ? ?Problem List Items Addressed This Visit   ? ?  ? Cardiovascular and Mediastinum  ? Essential hypertension, benign  ?  Stable. Continue enalapril.  ? ?  ?  ? Relevant Medications  ? atorvastatin (LIPITOR) 40 MG tablet  ? enalapril (VASOTEC) 10 MG tablet  ?  ? Respiratory  ? Asthma  ?  Uncontrolled. Adding Flovent. ? ?  ?  ? Relevant Medications  ? fluticasone (FLOVENT HFA) 220 MCG/ACT inhaler  ?  ? Other  ? Mixed hyperlipidemia  ?  Continue Lipitor. ? ?  ?  ? Relevant Medications  ? atorvastatin (LIPITOR) 40 MG tablet  ? enalapril (VASOTEC) 10 MG tablet  ? Prediabetes  ?  Stable. Will continue to monitor closely.  ? ?  ?  ? ? ?Meds ordered this encounter  ?Medications  ? alendronate (FOSAMAX) 70 MG tablet  ?  Sig: TAKE 1 TABLET BY MOUTH ONCE A WEEK TAKE  ON  AN  EMPTY  STOMACH  WITH  A  FULL  GLASS  OF  WATER  ?  Dispense:  12 tablet  ?  Refill:  3  ? famotidine (PEPCID) 40 MG tablet  ?  Sig: Take 1 tablet (40 mg total) by mouth daily. Prn acid reflux  ?  Dispense:  90 tablet  ?  Refill:  3  ? meclizine (ANTIVERT) 25 MG tablet  ?  Sig: TAKE 1 TABLET BY MOUTH TWICE DAILY AS NEEDED FOR DIZZINESS  ?  Dispense:  30 tablet  ?  Refill:  0  ? atorvastatin (LIPITOR) 40 MG tablet  ?  Sig: Take 1 tablet (40 mg total) by mouth daily.  ?  Dispense:  90 tablet  ?  Refill:  3  ? enalapril (VASOTEC) 10 MG tablet  ?  Sig: Take 1 tablet (10 mg total) by mouth at bedtime.  ?  Dispense:  90 tablet  ?  Refill:  3  ?  fluticasone (FLOVENT HFA) 220 MCG/ACT inhaler  ?  Sig: Inhale 1  puff into the lungs in the morning and at bedtime.  ?  Dispense:  1 each  ?  Refill:  12  ? ? ?Follow-up:  6 months. ?Thersa Salt DO ?[place] ? ?

## 2021-10-05 ENCOUNTER — Other Ambulatory Visit: Payer: Self-pay | Admitting: Family Medicine

## 2021-10-05 DIAGNOSIS — F419 Anxiety disorder, unspecified: Secondary | ICD-10-CM

## 2021-10-20 ENCOUNTER — Ambulatory Visit (INDEPENDENT_AMBULATORY_CARE_PROVIDER_SITE_OTHER): Payer: 59 | Admitting: Family Medicine

## 2021-10-20 VITALS — BP 117/71 | HR 91 | Temp 98.1°F | Ht 63.0 in | Wt 155.8 lb

## 2021-10-20 DIAGNOSIS — L0100 Impetigo, unspecified: Secondary | ICD-10-CM | POA: Diagnosis not present

## 2021-10-20 MED ORDER — MUPIROCIN 2 % EX OINT: 1.0000 | TOPICAL_OINTMENT | Freq: Three times a day (TID) | CUTANEOUS | 0 refills | Status: AC

## 2021-10-20 MED ORDER — DOXYCYCLINE HYCLATE 100 MG PO TABS: 100.0000 mg | ORAL_TABLET | Freq: Two times a day (BID) | ORAL | 0 refills | Status: DC

## 2021-10-20 NOTE — Assessment & Plan Note (Signed)
Treating with mupirocin and doxycycline.

## 2021-10-20 NOTE — Progress Notes (Signed)
Subjective:  Patient ID: Breanna Mason, female    DOB: [birth year]  Age: 76 y.o. MRN: [id number]  CC: Chief Complaint  Patient presents with   Rash    On left wrist. Has been there for 1-2 weeks. Started out as a blister. Area was itchy at first.  Also has a spot on lower right leg     HPI:  76 year old female presents for evaluation of the above.  Patient reports an area of concern on the dorsal aspect of her left wrist.  She states that it has been there for 1 to 2 weeks.  She states that this started out as a blister and has not completely healed.  She is concerned that it is infected.  It is itchy.  There is crusting present.  She has applied topical antibiotic ointment and kept the area clean without resolution.  No fever.  No other associated symptoms.  No other complaints.  Patient Active Problem List   Diagnosis Date Noted   Impetigo 10/20/2021   Anxiety 03/03/2021   Osteoporosis without current pathological fracture 08/29/2020   Prediabetes 09/08/2017   S/P shoulder replacement 03/11/2014   Esophageal reflux 09/07/2012   Essential hypertension, benign 03/06/2011   Mixed hyperlipidemia 03/06/2011   Asthma 03/06/2011    Social Hx   Social History   Socioeconomic History   Marital status: Married    Spouse name: Not on file   Number of children: Not on file   Years of education: Not on file   Highest education level: Not on file  Occupational History   Occupation: Engineering geologist    Comment: Retired  Tobacco Use   Smoking status: Never   Smokeless tobacco: Never  Vaping Use   Vaping Use: Never used  Substance and Sexual Activity   Alcohol use: No   Drug use: No   Sexual activity: Not Currently    Birth control/protection: Post-menopausal  Other Topics Concern   Not on file  Social History Narrative   Not on file   Social Determinants of Health   Financial Resource Strain: Low Risk  (05/30/2021)   Overall Financial Resource Strain (CARDIA)     Difficulty of Paying Living Expenses: Not very hard  Food Insecurity: No Food Insecurity (05/30/2021)   Hunger Vital Sign    Worried About Running Out of Food in the Last Year: Never true    Ran Out of Food in the Last Year: Never true  Transportation Needs: No Transportation Needs (05/30/2021)   PRAPARE - Hydrologist (Medical): No    Lack of Transportation (Non-Medical): No  Physical Activity: Insufficiently Active (05/30/2021)   Exercise Vital Sign    Days of Exercise per Week: 3 days    Minutes of Exercise per Session: 30 min  Stress: No Stress Concern Present (05/30/2021)   [place]    Feeling of Stress : Not at all  Social Connections: [place] (05/30/2021)   Social Connection and Isolation Panel [NHANES]    Frequency of Communication with Friends and Family: More than three times a week    Frequency of Social Gatherings with Friends and Family: More than three times a week    Attends Religious Services: More than 4 times per year    Active Member of [employer] or Organizations: Yes    Attends Music therapist: More than 4 times per year    Marital Status: Married  Review of Systems Per HPI  Objective:  BP 117/71   Pulse 91   Temp 98.1 F (36.7 C) (Oral)   Ht '5\' 3"'$  (1.6 m)   Wt 155 lb 12.8 oz (70.7 kg)   SpO2 95%   BMI 27.60 kg/m      10/20/2021   10:14 AM 08/31/2021    9:57 AM 05/30/2021    3:34 PM  BP/Weight  Systolic BP 712 197   Diastolic BP 71 84   Wt. (Lbs) 155.8 154 157  BMI 27.6 kg/m2 27.28 kg/m2 27.81 kg/m2    Physical Exam Vitals and nursing note reviewed.  Constitutional:      General: She is not in acute distress.    Appearance: Normal appearance.  HENT:     Head: Normocephalic and atraumatic.  Eyes:     General:        Right eye: No discharge.        Left eye: No discharge.     Conjunctiva/sclera: Conjunctivae normal.   Pulmonary:     Effort: Pulmonary effort is normal. No respiratory distress.  Skin:    Comments: Dorsum of the left wrist with an area of erythema with honey colored crusting.  Neurological:     Mental Status: She is alert.  Psychiatric:        Mood and Affect: Mood normal.        Behavior: Behavior normal.     Lab Results  Component Value Date   WBC 5.8 08/25/2021   HGB 15.4 08/25/2021   HCT 45.1 08/25/2021   PLT 234 08/25/2021   GLUCOSE 106 (H) 08/25/2021   CHOL 177 08/25/2021   TRIG 107 08/25/2021   HDL 56 08/25/2021   LDLCALC 102 (H) 08/25/2021   ALT 32 08/25/2021   AST 21 08/25/2021   NA 141 08/25/2021   K 4.4 08/25/2021   CL 106 08/25/2021   CREATININE 0.68 08/25/2021   BUN 18 08/25/2021   CO2 21 08/25/2021   TSH 2.320 02/11/2019   HGBA1C 5.9 (H) 08/25/2021     Assessment & Plan:   Problem List Items Addressed This Visit       Musculoskeletal and Integument   Impetigo - Primary    Treating with mupirocin and doxycycline.      Relevant Medications   mupirocin ointment (BACTROBAN) 2 %     Meds ordered this encounter  Medications   mupirocin ointment (BACTROBAN) 2 %    Sig: Apply 1 Application topically 3 (three) times daily for 7 days.    Dispense:  30 g    Refill:  0   doxycycline (VIBRA-TABS) 100 MG tablet    Sig: Take 1 tablet (100 mg total) by mouth 2 (two) times daily.    Dispense:  14 tablet    Refill:  [place]

## 2021-10-20 NOTE — Assessment & Plan Note (Deleted)
Stable

## 2021-10-20 NOTE — Assessment & Plan Note (Deleted)
Continue Fosamax  

## 2021-10-26 DIAGNOSIS — L820 Inflamed seborrheic keratosis: Secondary | ICD-10-CM | POA: Diagnosis not present

## 2021-10-26 DIAGNOSIS — Z1283 Encounter for screening for malignant neoplasm of skin: Secondary | ICD-10-CM | POA: Diagnosis not present

## 2021-10-26 DIAGNOSIS — D485 Neoplasm of uncertain behavior of skin: Secondary | ICD-10-CM | POA: Diagnosis not present

## 2021-10-26 DIAGNOSIS — S30860A Insect bite (nonvenomous) of lower back and pelvis, initial encounter: Secondary | ICD-10-CM | POA: Diagnosis not present

## 2021-10-26 DIAGNOSIS — L814 Other melanin hyperpigmentation: Secondary | ICD-10-CM | POA: Diagnosis not present

## 2021-10-26 DIAGNOSIS — D225 Melanocytic nevi of trunk: Secondary | ICD-10-CM | POA: Diagnosis not present

## 2021-10-26 DIAGNOSIS — L821 Other seborrheic keratosis: Secondary | ICD-10-CM | POA: Diagnosis not present

## 2021-11-22 DIAGNOSIS — D485 Neoplasm of uncertain behavior of skin: Secondary | ICD-10-CM | POA: Diagnosis not present

## 2021-11-22 DIAGNOSIS — D225 Melanocytic nevi of trunk: Secondary | ICD-10-CM | POA: Diagnosis not present

## 2021-11-22 DIAGNOSIS — L821 Other seborrheic keratosis: Secondary | ICD-10-CM | POA: Diagnosis not present

## 2022-01-01 ENCOUNTER — Telehealth: Payer: Self-pay | Admitting: Family Medicine

## 2022-01-01 DIAGNOSIS — I10 Essential (primary) hypertension: Secondary | ICD-10-CM

## 2022-01-01 DIAGNOSIS — R7303 Prediabetes: Secondary | ICD-10-CM

## 2022-01-01 DIAGNOSIS — E782 Mixed hyperlipidemia: Secondary | ICD-10-CM

## 2022-01-01 DIAGNOSIS — Z79899 Other long term (current) drug therapy: Secondary | ICD-10-CM

## 2022-01-01 NOTE — Telephone Encounter (Signed)
Last labs 08/25/21:  Lipid, CMP, HgbA1c, CBC

## 2022-01-01 NOTE — Telephone Encounter (Signed)
Patient is requesting labs for her 6 month follow up in October

## 2022-01-02 NOTE — Telephone Encounter (Signed)
Coral Spikes, DO     Same labs - CBC, CMP, Lipid, A1C

## 2022-01-03 NOTE — Telephone Encounter (Signed)
Blood work ordered in Epic. Patient notified. 

## 2022-01-04 DIAGNOSIS — J1189 Influenza due to unidentified influenza virus with other manifestations: Secondary | ICD-10-CM | POA: Diagnosis not present

## 2022-01-24 ENCOUNTER — Other Ambulatory Visit (HOSPITAL_COMMUNITY): Payer: Self-pay | Admitting: Family Medicine

## 2022-01-24 DIAGNOSIS — Z1231 Encounter for screening mammogram for malignant neoplasm of breast: Secondary | ICD-10-CM

## 2022-02-16 DIAGNOSIS — R7303 Prediabetes: Secondary | ICD-10-CM | POA: Diagnosis not present

## 2022-02-16 DIAGNOSIS — I10 Essential (primary) hypertension: Secondary | ICD-10-CM | POA: Diagnosis not present

## 2022-02-16 DIAGNOSIS — E782 Mixed hyperlipidemia: Secondary | ICD-10-CM | POA: Diagnosis not present

## 2022-02-16 DIAGNOSIS — Z79899 Other long term (current) drug therapy: Secondary | ICD-10-CM | POA: Diagnosis not present

## 2022-02-17 LAB — CBC WITH DIFFERENTIAL/PLATELET
Basophils Absolute: 0 10*3/uL (ref 0.0–0.2)
Basos: 1 %
EOS (ABSOLUTE): 0.2 10*3/uL (ref 0.0–0.4)
Eos: 3 %
Hematocrit: 44.6 % (ref 34.0–46.6)
Hemoglobin: 15.1 g/dL (ref 11.1–15.9)
Immature Grans (Abs): 0 10*3/uL (ref 0.0–0.1)
Immature Granulocytes: 0 %
Lymphocytes Absolute: 1.7 10*3/uL (ref 0.7–3.1)
Lymphs: 30 %
MCH: 31.5 pg (ref 26.6–33.0)
MCHC: 33.9 g/dL (ref 31.5–35.7)
MCV: 93 fL (ref 79–97)
Monocytes Absolute: 0.8 10*3/uL (ref 0.1–0.9)
Monocytes: 14 %
Neutrophils Absolute: 3 10*3/uL (ref 1.4–7.0)
Neutrophils: 52 %
Platelets: 227 10*3/uL (ref 150–450)
RBC: 4.8 x10E6/uL (ref 3.77–5.28)
RDW: 12.8 % (ref 11.7–15.4)
WBC: 5.6 10*3/uL (ref 3.4–10.8)

## 2022-02-17 LAB — CMP14+EGFR
ALT: 30 IU/L (ref 0–32)
AST: 26 IU/L (ref 0–40)
Albumin/Globulin Ratio: 2.1 (ref 1.2–2.2)
Albumin: 4.6 g/dL (ref 3.8–4.8)
Alkaline Phosphatase: 84 IU/L (ref 44–121)
BUN/Creatinine Ratio: 26 (ref 12–28)
BUN: 18 mg/dL (ref 8–27)
Bilirubin Total: 0.5 mg/dL (ref 0.0–1.2)
CO2: 19 mmol/L — ABNORMAL LOW (ref 20–29)
Calcium: 9 mg/dL (ref 8.7–10.3)
Chloride: 106 mmol/L (ref 96–106)
Creatinine, Ser: 0.69 mg/dL (ref 0.57–1.00)
Globulin, Total: 2.2 g/dL (ref 1.5–4.5)
Glucose: 117 mg/dL — ABNORMAL HIGH (ref 70–99)
Potassium: 4.5 mmol/L (ref 3.5–5.2)
Sodium: 141 mmol/L (ref 134–144)
Total Protein: 6.8 g/dL (ref 6.0–8.5)
eGFR: 90 mL/min/{1.73_m2} (ref >59)

## 2022-02-17 LAB — LIPID PANEL
Chol/HDL Ratio: 2.8 ratio (ref 0.0–4.4)
Cholesterol, Total: 179 mg/dL (ref 100–199)
HDL: 63 mg/dL (ref >39)
LDL Chol Calc (NIH): 98 mg/dL (ref 0–99)
Triglycerides: 99 mg/dL (ref 0–149)
VLDL Cholesterol Cal: 18 mg/dL (ref 5–40)

## 2022-02-17 LAB — HEMOGLOBIN A1C
Est. average glucose Bld gHb Est-mCnc: 120 mg/dL
Hgb A1c MFr Bld: 5.8 % — ABNORMAL HIGH (ref 4.8–5.6)

## 2022-03-01 ENCOUNTER — Ambulatory Visit (INDEPENDENT_AMBULATORY_CARE_PROVIDER_SITE_OTHER): Payer: 59 | Admitting: Family Medicine

## 2022-03-01 DIAGNOSIS — Z23 Encounter for immunization: Secondary | ICD-10-CM

## 2022-03-01 DIAGNOSIS — M810 Age-related osteoporosis without current pathological fracture: Secondary | ICD-10-CM

## 2022-03-01 DIAGNOSIS — F419 Anxiety disorder, unspecified: Secondary | ICD-10-CM | POA: Diagnosis not present

## 2022-03-01 DIAGNOSIS — R69 Illness, unspecified: Secondary | ICD-10-CM | POA: Diagnosis not present

## 2022-03-01 DIAGNOSIS — E782 Mixed hyperlipidemia: Secondary | ICD-10-CM | POA: Diagnosis not present

## 2022-03-01 DIAGNOSIS — I10 Essential (primary) hypertension: Secondary | ICD-10-CM

## 2022-03-01 MED ORDER — ALPRAZOLAM 0.5 MG PO TABS: 0.2500 mg | ORAL_TABLET | Freq: Two times a day (BID) | ORAL | 0 refills | Status: DC | PRN

## 2022-03-01 NOTE — Patient Instructions (Signed)
Stop Fosamax.  Continue your other medications.  Follow up in 6 months.  Take care  Dr. Lacinda Axon

## 2022-03-02 ENCOUNTER — Ambulatory Visit: Payer: Self-pay | Admitting: Family Medicine

## 2022-03-04 NOTE — Progress Notes (Signed)
Subjective:  Patient ID: Breanna Mason, female    DOB: [birth year]  Age: 76 y.o. MRN: [id number]  CC: Chief Complaint  Patient presents with   Hypertension    HPI:  76 year old female with hypertension, asthma, osteoporosis, prediabetes, hyperlipidemia, anxiety presents for follow-up.  Hypertension at goal on enalapril.  Anxiety is stable.  Uses alprazolam sparingly.  Needs refill.  Lipids well controlled on statin.  No reported side effects.  Remains prediabetic.  No progression.  Bone density shows improvement.  Osteopenia noted.  We will discuss stopping bisphosphonate.  Patient Active Problem List   Diagnosis Date Noted   Anxiety 03/03/2021   Osteoporosis without current pathological fracture 08/29/2020   Prediabetes 09/08/2017   S/P shoulder replacement 03/11/2014   Essential hypertension, benign 03/06/2011   Mixed hyperlipidemia 03/06/2011   Asthma 03/06/2011    Social Hx   Social History   Socioeconomic History   Marital status: Married    Spouse name: Not on file   Number of children: Not on file   Years of education: Not on file   Highest education level: Not on file  Occupational History   Occupation: Engineering geologist    Comment: Retired  Tobacco Use   Smoking status: Never   Smokeless tobacco: Never  Vaping Use   Vaping Use: Never used  Substance and Sexual Activity   Alcohol use: No   Drug use: No   Sexual activity: Not Currently    Birth control/protection: Post-menopausal  Other Topics Concern   Not on file  Social History Narrative   Not on file   Social Determinants of Health   Financial Resource Strain: Low Risk  (05/30/2021)   Overall Financial Resource Strain (CARDIA)    Difficulty of Paying Living Expenses: Not very hard  Food Insecurity: No Food Insecurity (05/30/2021)   Hunger Vital Sign    Worried About Running Out of Food in the Last Year: Never true    Ran Out of Food in the Last Year: Never true  Transportation Needs: No  Transportation Needs (05/30/2021)   PRAPARE - Hydrologist (Medical): No    Lack of Transportation (Non-Medical): No  Physical Activity: Insufficiently Active (05/30/2021)   Exercise Vital Sign    Days of Exercise per Week: 3 days    Minutes of Exercise per Session: 30 min  Stress: No Stress Concern Present (05/30/2021)   [place]    Feeling of Stress : Not at all  Social Connections: [place] (05/30/2021)   Social Connection and Isolation Panel [NHANES]    Frequency of Communication with Friends and Family: More than three times a week    Frequency of Social Gatherings with Friends and Family: More than three times a week    Attends Religious Services: More than 4 times per year    Active Member of [employer] or Organizations: Yes    Attends Music therapist: More than 4 times per year    Marital Status: Married    Review of Systems  Constitutional: Negative.   Respiratory: Negative.    Cardiovascular: Negative.    Objective:  BP 136/84   Pulse 84   Temp (!) 97.2 F (36.2 C)   Ht '5\' 3"'$  (1.6 m)   Wt 154 lb (69.9 kg)   SpO2 98%   BMI 27.28 kg/m      03/01/2022    9:55 AM 10/20/2021   10:14 AM 08/31/2021  9:57 AM  BP/Weight  Systolic BP 388 828 003  Diastolic BP 84 71 84  Wt. (Lbs) 154 155.8 154  BMI 27.28 kg/m2 27.6 kg/m2 27.28 kg/m2    Physical Exam Vitals and nursing note reviewed.  Constitutional:      General: She is not in acute distress.    Appearance: Normal appearance.  HENT:     Head: Normocephalic and atraumatic.  Eyes:     General:        Right eye: No discharge.        Left eye: No discharge.     Conjunctiva/sclera: Conjunctivae normal.  Cardiovascular:     Rate and Rhythm: Normal rate and regular rhythm.  Pulmonary:     Effort: Pulmonary effort is normal.     Breath sounds: Normal breath sounds. No wheezing or rales.   Neurological:     Mental Status: She is alert.  Psychiatric:        Mood and Affect: Mood normal.        Behavior: Behavior normal.     Lab Results  Component Value Date   WBC 5.6 02/16/2022   HGB 15.1 02/16/2022   HCT 44.6 02/16/2022   PLT 227 02/16/2022   GLUCOSE 117 (H) 02/16/2022   CHOL 179 02/16/2022   TRIG 99 02/16/2022   HDL 63 02/16/2022   LDLCALC 98 02/16/2022   ALT 30 02/16/2022   AST 26 02/16/2022   NA 141 02/16/2022   K 4.5 02/16/2022   CL 106 02/16/2022   CREATININE 0.69 02/16/2022   BUN 18 02/16/2022   CO2 19 (L) 02/16/2022   TSH 2.320 02/11/2019   HGBA1C 5.8 (H) 02/16/2022     Assessment & Plan:   Problem List Items Addressed This Visit       Cardiovascular and Mediastinum   Essential hypertension, benign    Well controlled. Continue Enalapril.         Musculoskeletal and Integument   Osteoporosis without current pathological fracture    Stopping Fosamax. Continue Calcium and Vitamin D. Dexa in 2024.        Other   Mixed hyperlipidemia    Stable. Continue Lipitor.       Anxiety    Stable. Xanax refilled.       Relevant Medications   ALPRAZolam (XANAX) 0.5 MG tablet   Other Visit Diagnoses     Need for vaccination       Relevant Orders   Flu Vaccine QUAD High Dose(Fluad) (Completed)       Meds ordered this encounter  Medications   ALPRAZolam (XANAX) 0.5 MG tablet    Sig: Take 0.5-1 tablets (0.25-0.5 mg total) by mouth 2 (two) times daily as needed for anxiety.    Dispense:  30 tablet    Refill:  0    Follow-up:  No follow-ups on file.  [place]

## 2022-03-04 NOTE — Assessment & Plan Note (Signed)
Stable. Xanax refilled.

## 2022-03-04 NOTE — Assessment & Plan Note (Signed)
Stopping Fosamax. Continue Calcium and Vitamin D. Dexa in 2024.

## 2022-03-04 NOTE — Assessment & Plan Note (Signed)
Well controlled. Continue Enalapril.

## 2022-03-04 NOTE — Assessment & Plan Note (Signed)
Stable. Continue Lipitor. 

## 2022-03-11 ENCOUNTER — Other Ambulatory Visit: Payer: Self-pay | Admitting: Family Medicine

## 2022-03-13 MED ORDER — MECLIZINE HCL 25 MG PO TABS: ORAL_TABLET | ORAL | 0 refills | Status: DC

## 2022-03-15 DIAGNOSIS — D225 Melanocytic nevi of trunk: Secondary | ICD-10-CM | POA: Diagnosis not present

## 2022-03-15 DIAGNOSIS — B078 Other viral warts: Secondary | ICD-10-CM | POA: Diagnosis not present

## 2022-03-15 DIAGNOSIS — Z1283 Encounter for screening for malignant neoplasm of skin: Secondary | ICD-10-CM | POA: Diagnosis not present

## 2022-03-16 ENCOUNTER — Ambulatory Visit (HOSPITAL_COMMUNITY): Payer: 59

## 2022-03-21 ENCOUNTER — Ambulatory Visit (HOSPITAL_COMMUNITY)
Admission: RE | Admit: 2022-03-21 | Discharge: 2022-03-21 | Disposition: A | Discharge status: Home / Self Care | Payer: Medicare HMO | Source: Ambulatory Visit | Attending: Family Medicine | Admitting: Family Medicine

## 2022-03-21 DIAGNOSIS — Z1231 Encounter for screening mammogram for malignant neoplasm of breast: Secondary | ICD-10-CM | POA: Insufficient documentation

## 2022-05-11 ENCOUNTER — Telehealth: Payer: 59 | Admitting: Family Medicine

## 2022-05-11 ENCOUNTER — Ambulatory Visit
Admission: EM | Admit: 2022-05-11 | Discharge: 2022-05-11 | Disposition: A | Discharge status: Home / Self Care | Payer: Medicare HMO

## 2022-05-11 DIAGNOSIS — J019 Acute sinusitis, unspecified: Secondary | ICD-10-CM | POA: Diagnosis not present

## 2022-05-11 DIAGNOSIS — B9689 Other specified bacterial agents as the cause of diseases classified elsewhere: Secondary | ICD-10-CM

## 2022-05-11 DIAGNOSIS — R051 Acute cough: Secondary | ICD-10-CM | POA: Diagnosis not present

## 2022-05-11 MED ORDER — BENZONATATE 100 MG PO CAPS: 100.0000 mg | ORAL_CAPSULE | Freq: Two times a day (BID) | ORAL | 0 refills | Status: DC | PRN

## 2022-05-11 MED ORDER — DOXYCYCLINE HYCLATE 100 MG PO TABS: 100.0000 mg | ORAL_TABLET | Freq: Two times a day (BID) | ORAL | 0 refills | Status: DC

## 2022-05-11 NOTE — Progress Notes (Signed)
E-Visit for Sinus Problems  We are sorry that you are not feeling well.  Here is how we plan to help!  Based on what you have shared with me it looks like you have sinusitis.  Sinusitis is inflammation and infection in the sinus cavities of the head.  Based on your presentation I believe you most likely have Acute Bacterial Sinusitis.  This is an infection caused by bacteria and is treated with antibiotics. I have prescribed Doxycycline '100mg'$  by mouth twice a day for 10 days. You may use an oral decongestant such as Mucinex D or if you have glaucoma or high blood pressure use plain Mucinex. Saline nasal spray help and can safely be used as often as needed for congestion.  If you develop worsening sinus pain, fever or notice severe headache and vision changes, or if symptoms are not better after completion of antibiotic, please schedule an appointment with a health care provider.    Sinus infections are not as easily transmitted as other respiratory infection, however we still recommend that you avoid close contact with loved ones, especially the very young and elderly.  Remember to wash your hands thoroughly throughout the day as this is the number one way to prevent the spread of infection!   Continue to use Mucinex and I will also order you cough perles to help some with cough.   Home Care: Only take medications as instructed by your medical team. Complete the entire course of an antibiotic. Do not take these medications with alcohol. A steam or ultrasonic humidifier can help congestion.  You can place a towel over your head and breathe in the steam from hot water coming from a faucet. Avoid close contacts especially the very young and the elderly. Cover your mouth when you cough or sneeze. Always remember to wash your hands.  Get Help Right Away If: You develop worsening fever or sinus pain. You develop a severe head ache or visual changes. Your symptoms persist after you have completed  your treatment plan.  Make sure you Understand these instructions. Will watch your condition. Will get help right away if you are not doing well or get worse.  Thank you for choosing an e-visit.  Your e-visit answers were reviewed by a board certified advanced clinical practitioner to complete your personal care plan. Depending upon the condition, your plan could have included both over the counter or prescription medications.  Please review your pharmacy choice. Make sure the pharmacy is open so you can pick up prescription now. If there is a problem, you may contact your provider through [employer] and have the prescription routed to another pharmacy.  Your safety is important to [place]. If you have drug allergies check your prescription carefully.   For the next 24 hours you can use MyChart to ask questions about today's visit, request a non-urgent call back, or ask for a work or school excuse. You will get an email in the next two days asking about your experience. I hope that your e-visit has been valuable and will speed your recovery.  I provided 5 minutes of non face-to-face time during this encounter for chart review, medication and order placement, as well as and documentation.

## 2022-05-12 ENCOUNTER — Other Ambulatory Visit: Payer: Self-pay | Admitting: Nurse Practitioner

## 2022-05-12 DIAGNOSIS — B9689 Other specified bacterial agents as the cause of diseases classified elsewhere: Secondary | ICD-10-CM

## 2022-05-12 MED ORDER — DOXYCYCLINE HYCLATE 100 MG PO TABS: 100.0000 mg | ORAL_TABLET | Freq: Two times a day (BID) | ORAL | 0 refills | Status: DC

## 2022-05-12 MED ORDER — AZITHROMYCIN 250 MG PO TABS
ORAL_TABLET | ORAL | 0 refills | Status: DC
Start: 2022-05-12 — End: 2022-05-12

## 2022-05-12 MED ORDER — AZITHROMYCIN 250 MG PO TABS: ORAL_TABLET | ORAL | 0 refills | Status: DC

## 2022-05-12 NOTE — Addendum Note (Signed)
Addended by: Chevis Pretty on: 05/12/2022 01:29 PM   Modules accepted: Orders

## 2022-05-12 NOTE — Addendum Note (Signed)
Addended by: Chevis Pretty on: 05/12/2022 01:49 PM   Modules accepted: Orders

## 2022-06-14 NOTE — Patient Instructions (Addendum)
Breanna Mason , Thank you for taking time to come for your Medicare Wellness Visit. I appreciate your ongoing commitment to your health goals. Please review the following plan we discussed and let me know if I can assist you in the future.   These are the goals we discussed:  Goals      Exercise 3x per week (30 min per time)     Increase exercise as tolerated.         This is a list of the screening recommended for you and due dates:  Health Maintenance  Topic Date Due   Zoster (Shingles) Vaccine (1 of 2) Never done   Medicare Annual Wellness Visit  06/16/2023   DTaP/Tdap/Td vaccine (2 - Tdap) 09/05/2023   Colon Cancer Screening  09/16/2024   Pneumonia Vaccine  Completed   Flu Shot  Completed   DEXA scan (bone density measurement)  Completed   HPV Vaccine  Aged Out   COVID-19 Vaccine  Discontinued   Hepatitis C Screening: USPSTF Recommendation to screen - Ages 3-79 yo.  Discontinued    Advanced directives: Please bring a copy of your health care power of attorney and living will to the office to be added to your chart at your convenience.   Conditions/risks identified: Aim for 30 minutes of exercise or brisk walking, 6-8 glasses of water, and 5 servings of fruits and vegetables each day.   Next appointment: Follow up in one year for your annual wellness visit    Preventive Care 65 Years and Older, Female Preventive care refers to lifestyle choices and visits with your health care provider that can promote health and wellness. What does preventive care include? A yearly physical exam. This is also called an annual well check. Dental exams once or twice a year. Routine eye exams. Ask your health care provider how often you should have your eyes checked. Personal lifestyle choices, including: Daily care of your teeth and gums. Regular physical activity. Eating a healthy diet. Avoiding tobacco and drug use. Limiting alcohol use. Practicing safe sex. Taking low-dose aspirin  every day. Taking vitamin and mineral supplements as recommended by your health care provider. What happens during an annual well check? The services and screenings done by your health care provider during your annual well check will depend on your age, overall health, lifestyle risk factors, and family history of disease. Counseling  Your health care provider may ask you questions about your: Alcohol use. Tobacco use. Drug use. Emotional well-being. Home and relationship well-being. Sexual activity. Eating habits. History of falls. Memory and ability to understand (cognition). Work and work Statistician. Reproductive health. Screening  You may have the following tests or measurements: Height, weight, and BMI. Blood pressure. Lipid and cholesterol levels. These may be checked every 5 years, or more frequently if you are over 16 years old. Skin check. Lung cancer screening. You may have this screening every year starting at age 32 if you have a 30-pack-year history of smoking and currently smoke or have quit within the past 15 years. Fecal occult blood test (FOBT) of the stool. You may have this test every year starting at age 31. Flexible sigmoidoscopy or colonoscopy. You may have a sigmoidoscopy every 5 years or a colonoscopy every 10 years starting at age 51. Hepatitis C blood test. Hepatitis B blood test. Sexually transmitted disease (STD) testing. Diabetes screening. This is done by checking your blood sugar (glucose) after you have not eaten for a while (fasting). You may have  this done every 1-3 years. Bone density scan. This is done to screen for osteoporosis. You may have this done starting at age 45. Mammogram. This may be done every 1-2 years. Talk to your health care provider about how often you should have regular mammograms. Talk with your health care provider about your test results, treatment options, and if necessary, the need for more tests. Vaccines  Your health  care provider may recommend certain vaccines, such as: Influenza vaccine. This is recommended every year. Tetanus, diphtheria, and acellular pertussis (Tdap, Td) vaccine. You may need a Td booster every 10 years. Zoster vaccine. You may need this after age 64. Pneumococcal 13-valent conjugate (PCV13) vaccine. One dose is recommended after age 83. Pneumococcal polysaccharide (PPSV23) vaccine. One dose is recommended after age 61. Talk to your health care provider about which screenings and vaccines you need and how often you need them. This information is not intended to replace advice given to you by your health care provider. Make sure you discuss any questions you have with your health care provider. Document Released: 05/20/2015 Document Revised: 01/11/2016 Document Reviewed: 02/22/2015 Elsevier Interactive Patient Education  2017 [place] Prevention in the Home Falls can cause injuries. They can happen to people of all ages. There are many things you can do to make your home safe and to help prevent falls. What can I do on the outside of my home? Regularly fix the edges of walkways and driveways and fix any cracks. Remove anything that might make you trip as you walk through a door, such as a raised step or threshold. Trim any bushes or trees on the path to your home. Use bright outdoor lighting. Clear any walking paths of anything that might make someone trip, such as rocks or tools. Regularly check to see if handrails are loose or broken. Make sure that both sides of any steps have handrails. Any raised decks and porches should have guardrails on the edges. Have any leaves, snow, or ice cleared regularly. Use sand or salt on walking paths during winter. Clean up any spills in your garage right away. This includes oil or grease spills. What can I do in the bathroom? Use night lights. Install grab bars by the toilet and in the tub and shower. Do not use towel bars as grab  bars. Use non-skid mats or decals in the tub or shower. If you need to sit down in the shower, use a plastic, non-slip stool. Keep the floor dry. Clean up any water that spills on the floor as soon as it happens. Remove soap buildup in the tub or shower regularly. Attach bath mats securely with double-sided non-slip rug tape. Do not have throw rugs and other things on the floor that can make you trip. What can I do in the bedroom? Use night lights. Make sure that you have a light by your bed that is easy to reach. Do not use any sheets or blankets that are too big for your bed. They should not hang down onto the floor. Have a firm chair that has side arms. You can use this for support while you get dressed. Do not have throw rugs and other things on the floor that can make you trip. What can I do in the kitchen? Clean up any spills right away. Avoid walking on wet floors. Keep items that you use a lot in easy-to-reach places. If you need to reach something above you, use a strong step stool  that has a grab bar. Keep electrical cords out of the way. Do not use floor polish or wax that makes floors slippery. If you must use wax, use non-skid floor wax. Do not have throw rugs and other things on the floor that can make you trip. What can I do with my stairs? Do not leave any items on the stairs. Make sure that there are handrails on both sides of the stairs and use them. Fix handrails that are broken or loose. Make sure that handrails are as long as the stairways. Check any carpeting to make sure that it is firmly attached to the stairs. Fix any carpet that is loose or worn. Avoid having throw rugs at the top or bottom of the stairs. If you do have throw rugs, attach them to the floor with carpet tape. Make sure that you have a light switch at the top of the stairs and the bottom of the stairs. If you do not have them, ask someone to add them for you. What else can I do to help prevent  falls? Wear shoes that: Do not have high heels. Have rubber bottoms. Are comfortable and fit you well. Are closed at the toe. Do not wear sandals. If you use a stepladder: Make sure that it is fully opened. Do not climb a closed stepladder. Make sure that both sides of the stepladder are locked into place. Ask someone to hold it for you, if possible. Clearly mark and make sure that you can see: Any grab bars or handrails. First and last steps. Where the edge of each step is. Use tools that help you move around (mobility aids) if they are needed. These include: Canes. Walkers. Scooters. Crutches. Turn on the lights when you go into a dark area. Replace any light bulbs as soon as they burn out. Set up your furniture so you have a clear path. Avoid moving your furniture around. If any of your floors are uneven, fix them. If there are any pets around you, be aware of where they are. Review your medicines with your doctor. Some medicines can make you feel dizzy. This can increase your chance of falling. Ask your doctor what other things that you can do to help prevent falls. This information is not intended to replace advice given to you by your health care provider. Make sure you discuss any questions you have with your health care provider. Document Released: 02/17/2009 Document Revised: 09/29/2015 Document Reviewed: 05/28/2014 Elsevier Interactive Patient Education  2017 [employer].

## 2022-06-14 NOTE — Progress Notes (Signed)
Subjective:   Breanna Mason is a 77 y.o. female who presents for Medicare Annual (Subsequent) preventive examination.  I connected with  Breanna Mason on 06/15/22 by a audio enabled telemedicine application and verified that I am speaking with the correct person using two identifiers.  Patient Location: Home  Provider Location: Office/Clinic  I discussed the limitations of evaluation and management by telemedicine. The patient expressed understanding and agreed to proceed.  Review of Systems     Cardiac Risk Factors include: advanced age (>52mn, >>38women);hypertension;dyslipidemia     Objective:    Today's Vitals   06/15/22 1457  Weight: 154 lb (69.9 kg)  Height: 5' 3"$  (1.6 m)   Body mass index is 27.28 kg/m.     06/15/2022    3:25 PM 05/30/2021    3:38 PM 08/21/2019    2:59 PM 09/16/2017    8:03 AM 07/01/2017    9:07 AM 06/17/2017   11:54 AM 06/12/2017    3:28 PM  Advanced Directives  Does Patient Have a Medical Advance Directive? Yes Yes No No Yes Yes Yes  Type of AIndustrial/product designerof [place] will    Healthcare Power of [place] Does patient want to make changes to medical advance directive? No - Patient declined        Copy of [place] Chart? No - copy requested No - copy requested    No - copy requested No - copy requested  Would patient like information on creating a medical advance directive?   No - Patient declined No - Patient declined       Current Medications (verified) Outpatient Encounter Medications as of 06/15/2022  Medication Sig   acetaminophen (TYLENOL) 500 MG tablet Take 500-1,000 mg by mouth every 6 (six) hours as needed (for headaches/pain.).   ALPRAZolam (XANAX) 0.5 MG tablet Take 0.5-1 tablets (0.25-0.5 mg total) by mouth 2 (two) times daily as needed for anxiety.   atorvastatin (LIPITOR) 40 MG tablet Take 1 tablet (40 mg total) by mouth  daily.   calcium-vitamin D (OSCAL WITH D) 500-200 MG-UNIT per tablet Take 1 tablet by mouth daily.    docusate sodium (COLACE) 50 MG capsule Take 50 mg by mouth daily as needed for mild constipation.    enalapril (VASOTEC) 10 MG tablet Take 1 tablet (10 mg total) by mouth at bedtime.   famotidine (PEPCID) 40 MG tablet Take 1 tablet (40 mg total) by mouth daily. Prn acid reflux   fluticasone (FLOVENT HFA) 220 MCG/ACT inhaler Inhale 1 puff into the lungs in the morning and at bedtime.   meclizine (ANTIVERT) 25 MG tablet TAKE 1 TABLET BY MOUTH TWICE DAILY AS NEEDED FOR DIZZINESS   VENTOLIN HFA 108 (90 Base) MCG/ACT inhaler INHALE 2 PUFFS BY MOUTH EVERY 6 HOURS AS NEEDED FOR WHEEZING FOR SHORTNESS OF BREATH   [DISCONTINUED] azithromycin (ZITHROMAX Z-PAK) 250 MG tablet As directed   [DISCONTINUED] benzonatate (TESSALON) 100 MG capsule Take 1 capsule (100 mg total) by mouth 2 (two) times daily as needed for cough.   [DISCONTINUED] doxycycline (VIBRA-TABS) 100 MG tablet Take 1 tablet (100 mg total) by mouth 2 (two) times daily. 1 po bid   No facility-administered encounter medications on file as of 06/15/2022.    Allergies (verified) Levofloxacin, Other, and Penicillins   History: Past Medical History:  Diagnosis Date   Allergy    Anxiety    Arthritis  Asthma    Essential hypertension, benign    GERD (gastroesophageal reflux disease)    IFG (impaired fasting glucose)    Impaired glucose tolerance    Mixed hyperlipidemia    Osteopenia    Osteoporosis    Pneumonia    hx   PONV (postoperative nausea and vomiting)    Reflux    Past Surgical History:  Procedure Laterality Date   Bilateral tubal ligation     CARPAL TUNNEL RELEASE Right    30 yrs   CATARACT EXTRACTION W/PHACO Right 06/17/2017   Procedure: CATARACT EXTRACTION PHACO AND INTRAOCULAR LENS PLACEMENT RIGHT EYE;  Surgeon: Tonny Branch, MD;  Location: [facility];  Service: Ophthalmology;  Laterality: Right;  CDE: 17.57   CATARACT  EXTRACTION W/PHACO Left 07/01/2017   Procedure: CATARACT EXTRACTION PHACO AND INTRAOCULAR LENS PLACEMENT (IOC);  Surgeon: Tonny Branch, MD;  Location: [facility];  Service: Ophthalmology;  Laterality: Left;  CDE: 17.35   CESAREAN SECTION     COLONOSCOPY N/A 09/17/2013   Procedure: COLONOSCOPY;  Surgeon: Rogene Houston, MD;  Location: [facility];  Service: Endoscopy;  Laterality: N/A;  1200   COLONOSCOPY N/A 09/16/2017   Procedure: COLONOSCOPY;  Surgeon: Rogene Houston, MD;  [facility]: [facility];  Service: Endoscopy;  Laterality: N/A;  8:55   HYSTEROSCOPY WITH D & C N/A 08/25/2019   Procedure: DILATATION AND CURETTAGE /HYSTEROSCOPY;  Surgeon: Jonnie Kind, MD;  Location: [facility];  Service: Gynecology;  Laterality: N/A;   POLYPECTOMY N/A 08/25/2019   Procedure: ENDOMETRIAL POLYPECTOMY;  Surgeon: Jonnie Kind, MD;  Location: [facility];  Service: Gynecology;  Laterality: N/A;   REVERSE SHOULDER ARTHROPLASTY Right 03/11/2014   Procedure: RIGHT REVERSE SHOULDER ARTHROPLASTY;  Surgeon: Marin Shutter, MD;  Location: [place];  Service: Orthopedics;  Laterality: Right;   SHOULDER SURGERY Right    x4   TUBAL LIGATION     Family History  Problem Relation Age of Onset   Hypertension Father    Diabetes type II Father    Cancer Father    Hypertension Mother    COPD Mother    Hypertension Son    Social History   Socioeconomic History   Marital status: Married    Spouse name: Not on file   Number of children: Not on file   Years of education: Not on file   Highest education level: Not on file  Occupational History   Occupation: Engineering geologist    Comment: Retired  Tobacco Use   Smoking status: Never   Smokeless tobacco: Never  Vaping Use   Vaping Use: Never used  Substance and Sexual Activity   Alcohol use: No   Drug use: No   Sexual activity: Not Currently    Birth control/protection: Post-menopausal  Other Topics Concern   Not on file  Social History Narrative   Not on file    Social Determinants of Health   Financial Resource Strain: Low Risk  (06/15/2022)   Overall Financial Resource Strain (CARDIA)    Difficulty of Paying Living Expenses: Not hard at all  Food Insecurity: No Food Insecurity (06/15/2022)   Hunger Vital Sign    Worried About Running Out of Food in the Last Year: Never true    [place] in the Last Year: Never true  Transportation Needs: No Transportation Needs (06/15/2022)   PRAPARE - Hydrologist (Medical): No    Lack of Transportation (Non-Medical): No  Physical Activity: Insufficiently  Active (06/15/2022)   Exercise Vital Sign    Days of Exercise per Week: 3 days    Minutes of Exercise per Session: 30 min  Stress: No Stress Concern Present (06/15/2022)   [place]    Feeling of Stress : Not at all  Social Connections: [place] (06/15/2022)   Social Connection and Isolation Panel [NHANES]    Frequency of Communication with Friends and Family: More than three times a week    Frequency of Social Gatherings with Friends and Family: More than three times a week    Attends Religious Services: More than 4 times per year    Active Member of [employer] or Organizations: Yes    Attends Music therapist: More than 4 times per year    Marital Status: Married    Tobacco Counseling Counseling given: Not Answered   Clinical Intake:  Pre-visit preparation completed: Yes  Pain : No/denies pain  Diabetes: No  How often do you need to have someone help you when you read instructions, pamphlets, or other written materials from your doctor or pharmacy?: 1 - Never  Diabetic?No   Interpreter Needed?: No  Information entered by :: Denman George LPN   Activities of Daily Living    06/15/2022    3:00 PM  In your present state of health, do you have any difficulty performing the following activities:  Hearing? 0  Vision? 0   Difficulty concentrating or making decisions? 0  Walking or climbing stairs? 0  Dressing or bathing? 0  Doing errands, shopping? 0  Preparing Food and eating ? N  Using the Toilet? N  In the past six months, have you accidently leaked urine? N  Do you have problems with loss of bowel control? N  Managing your Medications? N  Managing your Finances? N  Housekeeping or managing your Housekeeping? N    Patient Care Team: Coral Spikes, DO as PCP - General (Family Medicine) Madelin Headings, DO (Optometry) Allyn Kenner, MD (Dermatology)  Indicate any recent Medical Services you may have received from other than [facility] providers in the past year (date may be approximate).     Assessment:   This is a routine wellness examination for Kadeisha.  Hearing/Vision screen Hearing Screening - Comments:: Denies hearing difficulties  Vision Screening - Comments:: Wears rx glasses - up to date with routine eye exams with Dr. Jorja Loa    Dietary issues and exercise activities discussed: Current Exercise Habits: The patient does not participate in regular exercise at present   Goals Addressed   None    Depression Screen    06/15/2022    3:24 PM 08/31/2021   10:04 AM 08/31/2021   10:01 AM 05/30/2021    3:36 PM 03/02/2021    2:59 PM 10/28/2020   10:16 AM 08/29/2020    1:16 PM  PHQ 2/9 Scores  PHQ - 2 Score 0 0 0 0 0 0 0    Fall Risk    06/15/2022    2:59 PM 03/01/2022   10:21 AM 08/31/2021   10:04 AM 08/31/2021   10:00 AM 05/30/2021    3:39 PM  [place] in the past year? 0 0 0 0 0  Number falls in past yr: 0  0 0 0  Injury with Fall? 0  0 0 0  Risk for fall due to :   No Fall Risks No Fall Risks No Fall Risks  Follow up  Falls prevention discussed;Education provided;Falls evaluation completed  Falls evaluation completed Falls evaluation completed Falls prevention discussed    FALL RISK PREVENTION PERTAINING TO THE HOME:  Any stairs in or around the home? No  If so, are there any  without handrails? No  Home free of loose throw rugs in walkways, pet beds, electrical cords, etc? Yes  Adequate lighting in your home to reduce risk of falls? Yes   ASSISTIVE DEVICES UTILIZED TO PREVENT FALLS:  Life alert? No  Use of a cane, walker or w/c? No  Grab bars in the bathroom? Yes  Shower chair or bench in shower? No  Elevated toilet seat or a handicapped toilet? Yes   TIMED UP AND GO:  Was the test performed? No . Telephonic visit   Cognitive Function:        06/15/2022    3:01 PM 05/30/2021    3:40 PM  6CIT Screen  What Year? 0 points 0 points  What month? 0 points 0 points  What time? 0 points 0 points  Count back from 20 0 points 0 points  Months in reverse 0 points 0 points  Repeat phrase 0 points 0 points  Total Score 0 points 0 points    Immunizations Immunization History  Administered Date(s) Administered   Fluad Quad(high Dose 65+) 02/29/2020, 03/01/2022   Influenza,inj,Quad PF,6+ Mos 02/23/2014, 01/29/2017, 03/10/2018, 02/10/2019   Influenza-Unspecified 03/07/2012, 02/13/2013, 02/09/2015, 01/23/2016, 02/03/2021   Moderna Sars-Covid-2 Vaccination 07/10/2019, 08/11/2019   Pneumococcal Conjugate-13 09/04/2013   Pneumococcal Polysaccharide-23 01/05/2010, 03/06/2016   Td 09/04/2013   Zoster, Live 01/15/2008    TDAP status: Up to date  Flu Vaccine status: Up to date  Pneumococcal vaccine status: Up to date  Covid-19 vaccine status: Information provided on how to obtain vaccines.   Qualifies for Shingles Vaccine? Yes   Zostavax completed No   Shingrix Completed?: No.    Education has been provided regarding the importance of this vaccine. Patient has been advised to call insurance company to determine out of pocket expense if they have not yet received this vaccine. Advised may also receive vaccine at local pharmacy or Health Dept. Verbalized acceptance and understanding.  Screening Tests Health Maintenance  Topic Date Due   Zoster Vaccines-  Shingrix (1 of 2) Never done   Medicare Annual Wellness (AWV)  06/16/2023   DTaP/Tdap/Td (2 - Tdap) 09/05/2023   COLONOSCOPY (Pts 45-50yr Insurance coverage will need to be confirmed)  09/16/2024   Pneumonia Vaccine 77 Years old  Completed   INFLUENZA VACCINE  Completed   DEXA SCAN  Completed   HPV VACCINES  Aged Out   COVID-19 Vaccine  Discontinued   Hepatitis C Screening  Discontinued    Health Maintenance  Health Maintenance Due  Topic Date Due   Zoster Vaccines- Shingrix (1 of 2) Never done    Colorectal cancer screening: Type of screening: Colonoscopy. Completed 09/16/17. Repeat every 7 years  Mammogram status: No longer required due to age.  Bone Density status: Completed 04/06/21. Results reflect: Bone density results: OSTEOPENIA. Repeat every 2 years.  Lung Cancer Screening: (Low Dose CT Chest recommended if Age 11040-80years, 30 pack-year currently smoking OR have quit w/in 15years.) does not qualify.   Lung Cancer Screening Referral: n/a   Additional Screening:  Hepatitis C Screening: does qualify; Completed at next office visit   Vision Screening: Recommended annual ophthalmology exams for early detection of glaucoma and other disorders of the eye. Is the patient up to date with their  annual eye exam?  Yes  Who is the provider or what is the name of the office in which the patient attends annual eye exams? Dr. Jorja Loa  If pt is not established with a provider, would they like to be referred to a provider to establish care? No .   Dental Screening: Recommended annual dental exams for proper oral hygiene  Community Resource Referral / Chronic Care Management: CRR required this visit?  No   CCM required this visit?  No      Plan:     I have personally reviewed and noted the following in the patient's chart:   Medical and social history Use of alcohol, tobacco or illicit drugs  Current medications and supplements including opioid prescriptions. Patient is not  currently taking opioid prescriptions. Functional ability and status Nutritional status Physical activity Advanced directives List of other physicians Hospitalizations, surgeries, and ER visits in previous 12 months Vitals Screenings to include cognitive, depression, and falls Referrals and appointments  In addition, I have reviewed and discussed with patient certain preventive protocols, quality metrics, and best practice recommendations. A written personalized care plan for preventive services as well as general preventive health recommendations were provided to patient.     Vanetta Mulders, [place]   [id number]   Due to this being a virtual visit, the after visit summary with patients personalized plan was offered to patient via mail or my-chart.  Patient would like to access on my-chart  Nurse Notes: No concerns

## 2022-06-15 ENCOUNTER — Ambulatory Visit (INDEPENDENT_AMBULATORY_CARE_PROVIDER_SITE_OTHER): Payer: 59

## 2022-06-15 VITALS — Ht 63.0 in | Wt 154.0 lb

## 2022-06-15 DIAGNOSIS — Z0000 Encounter for general adult medical examination without abnormal findings: Secondary | ICD-10-CM | POA: Diagnosis not present

## 2022-06-21 ENCOUNTER — Other Ambulatory Visit: Payer: Self-pay | Admitting: Family Medicine

## 2022-06-21 ENCOUNTER — Telehealth: Payer: Self-pay | Admitting: Family Medicine

## 2022-06-21 MED ORDER — UMECLIDINIUM-VILANTEROL 62.5-25 MCG/ACT IN AEPB: 1.0000 | INHALATION_SPRAY | Freq: Every day | RESPIRATORY_TRACT | 3 refills | Status: DC

## 2022-06-21 NOTE — Telephone Encounter (Signed)
Pt states the Anoro is $291. Pt told pharmacy to not authorize it through insurance. Pt states she has an extra inhaler that she has not used.

## 2022-06-21 NOTE — Telephone Encounter (Signed)
PA completed for Fluticasone Inhaler. PA has been denied due to requested drug not on plan formulary. Denial letter states that the following may be covered: Arnuity Ellipta inhaler (quantity limit of 30 doses every 30 days) Budesonide suspension (requires PA) Fluticasone propionate nasal spray(quantity limit of 16 grams every 30 days) Fluticasone propionate cream  Fluticasone propionate lotion (quantity limit of 120 mL every 30 days) Fluticasone propionate ointment   Please advise. Thank you.

## 2022-07-05 DIAGNOSIS — H04123 Dry eye syndrome of bilateral lacrimal glands: Secondary | ICD-10-CM | POA: Diagnosis not present

## 2022-07-18 ENCOUNTER — Telehealth: Payer: Self-pay

## 2022-07-18 DIAGNOSIS — I10 Essential (primary) hypertension: Secondary | ICD-10-CM

## 2022-07-18 DIAGNOSIS — R7303 Prediabetes: Secondary | ICD-10-CM

## 2022-07-18 DIAGNOSIS — Z79899 Other long term (current) drug therapy: Secondary | ICD-10-CM

## 2022-07-18 DIAGNOSIS — E782 Mixed hyperlipidemia: Secondary | ICD-10-CM

## 2022-07-18 NOTE — Telephone Encounter (Signed)
Pt needs labs ordered before her appt on

## 2022-07-25 NOTE — Addendum Note (Signed)
Addended by: Orvan Seen on: 07/25/2022 10:13 AM   Modules accepted: Orders

## 2022-07-25 NOTE — Telephone Encounter (Signed)
Patient called notified labs are ordered in EPIC.

## 2022-08-23 DIAGNOSIS — E782 Mixed hyperlipidemia: Secondary | ICD-10-CM | POA: Diagnosis not present

## 2022-08-23 DIAGNOSIS — R7303 Prediabetes: Secondary | ICD-10-CM | POA: Diagnosis not present

## 2022-08-23 DIAGNOSIS — Z79899 Other long term (current) drug therapy: Secondary | ICD-10-CM | POA: Diagnosis not present

## 2022-08-23 DIAGNOSIS — I10 Essential (primary) hypertension: Secondary | ICD-10-CM | POA: Diagnosis not present

## 2022-08-24 LAB — CBC WITH DIFFERENTIAL/PLATELET
Basophils Absolute: 0 10*3/uL (ref 0.0–0.2)
Basos: 1 %
EOS (ABSOLUTE): 0.1 10*3/uL (ref 0.0–0.4)
Eos: 2 %
Hematocrit: 45.8 % (ref 34.0–46.6)
Hemoglobin: 15.3 g/dL (ref 11.1–15.9)
Immature Grans (Abs): 0 10*3/uL (ref 0.0–0.1)
Immature Granulocytes: 0 %
Lymphocytes Absolute: 1.6 10*3/uL (ref 0.7–3.1)
Lymphs: 31 %
MCH: 31.7 pg (ref 26.6–33.0)
MCHC: 33.4 g/dL (ref 31.5–35.7)
MCV: 95 fL (ref 79–97)
Monocytes Absolute: 0.8 10*3/uL (ref 0.1–0.9)
Monocytes: 16 %
Neutrophils Absolute: 2.5 10*3/uL (ref 1.4–7.0)
Neutrophils: 50 %
Platelets: 229 10*3/uL (ref 150–450)
RBC: 4.82 x10E6/uL (ref 3.77–5.28)
RDW: 13.2 % (ref 11.7–15.4)
WBC: 5 10*3/uL (ref 3.4–10.8)

## 2022-08-24 LAB — CMP14+EGFR
ALT: 31 IU/L (ref 0–32)
AST: 23 IU/L (ref 0–40)
Albumin/Globulin Ratio: 2 (ref 1.2–2.2)
Albumin: 4.4 g/dL (ref 3.8–4.8)
Alkaline Phosphatase: 94 IU/L (ref 44–121)
BUN/Creatinine Ratio: 21 (ref 12–28)
BUN: 15 mg/dL (ref 8–27)
Bilirubin Total: 0.5 mg/dL (ref 0.0–1.2)
CO2: 20 mmol/L (ref 20–29)
Calcium: 9.2 mg/dL (ref 8.7–10.3)
Chloride: 105 mmol/L (ref 96–106)
Creatinine, Ser: 0.71 mg/dL (ref 0.57–1.00)
Globulin, Total: 2.2 g/dL (ref 1.5–4.5)
Glucose: 112 mg/dL — ABNORMAL HIGH (ref 70–99)
Potassium: 4.7 mmol/L (ref 3.5–5.2)
Sodium: 142 mmol/L (ref 134–144)
Total Protein: 6.6 g/dL (ref 6.0–8.5)
eGFR: 89 mL/min/{1.73_m2} (ref >59)

## 2022-08-24 LAB — LIPID PANEL
Chol/HDL Ratio: 3.8 ratio (ref 0.0–4.4)
Cholesterol, Total: 192 mg/dL (ref 100–199)
HDL: 50 mg/dL (ref >39)
LDL Chol Calc (NIH): 117 mg/dL — ABNORMAL HIGH (ref 0–99)
Triglycerides: 140 mg/dL (ref 0–149)
VLDL Cholesterol Cal: 25 mg/dL (ref 5–40)

## 2022-08-24 LAB — HEMOGLOBIN A1C
Est. average glucose Bld gHb Est-mCnc: 126 mg/dL
Hgb A1c MFr Bld: 6 % — ABNORMAL HIGH (ref 4.8–5.6)

## 2022-08-28 ENCOUNTER — Telehealth: Payer: Self-pay | Admitting: Family Medicine

## 2022-08-28 MED ORDER — FLUTICASONE PROPIONATE HFA 220 MCG/ACT IN AERO: 1.0000 | INHALATION_SPRAY | Freq: Two times a day (BID) | RESPIRATORY_TRACT | 7 refills | Status: DC

## 2022-08-28 NOTE — Telephone Encounter (Signed)
Refill on  fluticasone (FLOVENT HFA) 220 MCG/ACT inhaler -[facility]  [address]

## 2022-08-30 ENCOUNTER — Ambulatory Visit: Payer: 59 | Admitting: Family Medicine

## 2022-08-31 ENCOUNTER — Ambulatory Visit (INDEPENDENT_AMBULATORY_CARE_PROVIDER_SITE_OTHER): Payer: 59 | Admitting: Family Medicine

## 2022-08-31 VITALS — BP 120/76 | HR 87 | Temp 97.2°F | Wt 157.0 lb

## 2022-08-31 DIAGNOSIS — E782 Mixed hyperlipidemia: Secondary | ICD-10-CM | POA: Diagnosis not present

## 2022-08-31 DIAGNOSIS — J4540 Moderate persistent asthma, uncomplicated: Secondary | ICD-10-CM | POA: Diagnosis not present

## 2022-08-31 DIAGNOSIS — I10 Essential (primary) hypertension: Secondary | ICD-10-CM

## 2022-08-31 MED ORDER — FAMOTIDINE 40 MG PO TABS: 40.0000 mg | ORAL_TABLET | Freq: Every day | ORAL | 3 refills | Status: DC

## 2022-08-31 MED ORDER — ENALAPRIL MALEATE 10 MG PO TABS: 10.0000 mg | ORAL_TABLET | Freq: Every day | ORAL | 3 refills | Status: DC

## 2022-08-31 MED ORDER — ALBUTEROL SULFATE HFA 108 (90 BASE) MCG/ACT IN AERS: 1.0000 | INHALATION_SPRAY | Freq: Four times a day (QID) | RESPIRATORY_TRACT | 3 refills | Status: AC | PRN

## 2022-08-31 MED ORDER — BUDESONIDE-FORMOTEROL FUMARATE 160-4.5 MCG/ACT IN AERO: 2.0000 | INHALATION_SPRAY | Freq: Two times a day (BID) | RESPIRATORY_TRACT | 12 refills | Status: DC

## 2022-08-31 NOTE — Patient Instructions (Signed)
Hold the Lipitor for the next 6-8 weeks.  I sent in a new inhaler.  Follow up in 6 months.

## 2022-09-02 NOTE — Assessment & Plan Note (Signed)
LDL elevated. Trial off statin due to patient's current symptoms.

## 2022-09-02 NOTE — Assessment & Plan Note (Signed)
Stable. Continue Enalapril. 

## 2022-09-02 NOTE — Progress Notes (Signed)
Subjective:  Patient ID: Breanna Mason, female    DOB: [birth year]  Age: 77 y.o. MRN: [id number]  CC: Chief Complaint  Patient presents with   Hypertension   Extremity Weakness    Lower legs not able to come to standing position w/o assistance    HPI:  77 year old female with HTN, Asthma, Osteoporosis, Prediabetes, HLD, Anxiety presents for follow up.  Hypertension is at goal on Enalapril.  Patient is having some stiffness in the needs and difficulty going down to the knees and getting up.  She is concerned this may be from Atorvastatin.   Patient's Flovent no longer covered. She needs an alternative.   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  Tobacco Use   Smoking status: Never   Smokeless tobacco: Never  Vaping Use   Vaping Use: Never used  Substance and Sexual Activity   Alcohol use: No   Drug use: No   Sexual activity: Not Currently    Birth control/protection: Post-menopausal  Other Topics Concern   Not on file  Social History Narrative   Not on file   Social Determinants of Health   Financial Resource Strain: Low Risk  (06/15/2022)   Overall Financial Resource Strain (CARDIA)    Difficulty of Paying Living Expenses: Not hard at all  Food Insecurity: No Food Insecurity (06/15/2022)   Hunger Vital Sign    Worried About Running Out of Food in the Last Year: Never true    Ran Out of Food in the Last Year: Never  true  Transportation Needs: No Transportation Needs (06/15/2022)   PRAPARE - Administrator, Civil Service (Medical): No    Lack of Transportation (Non-Medical): No  Physical Activity: Insufficiently Active (06/15/2022)   Exercise Vital Sign    Days of Exercise per Week: 3 days    Minutes of Exercise per Session: 30 min  Stress: No Stress Concern Present (06/15/2022)   [employer] of Occupational Health - Occupational Stress Questionnaire    Feeling of Stress : Not at all  Social Connections: Socially Integrated (06/15/2022)   Social Connection and Isolation Panel [NHANES]    Frequency of Communication with Friends and Family: More than three times a week    Frequency of Social Gatherings with Friends and Family: More than three times a week    Attends Religious Services: More than 4 times per year    Active Member of [employer] or Organizations: Yes    Attends Engineer, structural: More than 4 times per year    Marital Status: Married    Review of Systems Per HPI  Objective:  BP 120/76   Pulse 87   Temp (!) 97.2 F (36.2 C)   Wt 157 lb (71.2 kg)   SpO2 97%   BMI 27.81 kg/m      08/31/2022    9:20 AM 06/15/2022  2:57 PM 03/01/2022    9:55 AM  BP/Weight  Systolic BP 120  409  Diastolic BP 76  84  Wt. (Lbs) 157 154 154  BMI 27.81 kg/m2 27.28 kg/m2 27.28 kg/m2    Physical Exam Vitals and nursing note reviewed.  Constitutional:      General: She is not in acute distress.    Appearance: Normal appearance.  HENT:     Head: Normocephalic and atraumatic.  Eyes:     General:        Right eye: No discharge.        Left eye: No discharge.     Conjunctiva/sclera: Conjunctivae normal.  Cardiovascular:     Rate and Rhythm: Normal rate and regular rhythm.  Pulmonary:     Effort: Pulmonary effort is normal.     Breath sounds: Normal breath sounds. No wheezing, rhonchi or rales.  Neurological:     Mental Status: She is alert.  Psychiatric:        Mood and  Affect: Mood normal.        Behavior: Behavior normal.    Lab Results  Component Value Date   WBC 5.0 08/23/2022   HGB 15.3 08/23/2022   HCT 45.8 08/23/2022   PLT 229 08/23/2022   GLUCOSE 112 (H) 08/23/2022   CHOL 192 08/23/2022   TRIG 140 08/23/2022   HDL 50 08/23/2022   LDLCALC 117 (H) 08/23/2022   ALT 31 08/23/2022   AST 23 08/23/2022   NA 142 08/23/2022   K 4.7 08/23/2022   CL 105 08/23/2022   CREATININE 0.71 08/23/2022   BUN 15 08/23/2022   CO2 20 08/23/2022   TSH 2.320 02/11/2019   HGBA1C 6.0 (H) 08/23/2022     Assessment & Plan:   Problem List Items Addressed This Visit       Cardiovascular and Mediastinum   Essential hypertension, benign - Primary    Stable. Continue Enalapril.       Relevant Medications   enalapril (VASOTEC) 10 MG tablet     Respiratory   Asthma    Sending in Symbicort.       Relevant Medications   budesonide-formoterol (SYMBICORT) 160-4.5 MCG/ACT inhaler   albuterol (VENTOLIN HFA) 108 (90 Base) MCG/ACT inhaler     Other   Mixed hyperlipidemia    LDL elevated. Trial off statin due to patient's current symptoms.       Relevant Medications   enalapril (VASOTEC) 10 MG tablet    Meds ordered this encounter  Medications   budesonide-formoterol (SYMBICORT) 160-4.5 MCG/ACT inhaler    Sig: Inhale 2 puffs into the lungs in the morning and at bedtime.    Dispense:  1 each    Refill:  12   enalapril (VASOTEC) 10 MG tablet    Sig: Take 1 tablet (10 mg total) by mouth at bedtime.    Dispense:  90 tablet    Refill:  3   famotidine (PEPCID) 40 MG tablet    Sig: Take 1 tablet (40 mg total) by mouth daily. Prn acid reflux    Dispense:  90 tablet    Refill:  3   albuterol (VENTOLIN HFA) 108 (90 Base) MCG/ACT inhaler    Sig: Inhale 1-2 puffs into the lungs every 6 (six) hours as needed for wheezing or shortness of breath.    Dispense:  8.5 g    Refill:  3    Follow-up:  Return in about 6 months (around 03/02/2023).     DO [place]  [facility]

## 2022-09-02 NOTE — Assessment & Plan Note (Signed)
Sending in Symbicort.

## 2022-09-04 ENCOUNTER — Other Ambulatory Visit: Payer: Self-pay | Admitting: Family Medicine

## 2022-09-05 ENCOUNTER — Telehealth: Payer: Self-pay

## 2022-09-05 ENCOUNTER — Other Ambulatory Visit: Payer: Self-pay | Admitting: Family Medicine

## 2022-09-05 MED ORDER — ARNUITY ELLIPTA 200 MCG/ACT IN AEPB: 200.0000 ug | INHALATION_SPRAY | Freq: Every day | RESPIRATORY_TRACT | 11 refills | Status: DC

## 2022-09-05 NOTE — Telephone Encounter (Signed)
Prescription Request  09/05/2022  LOV: Visit date not found  What is the name of the medication or equipment? Flovent HFA 220 MCG AER qty 12 Note from [facility] by insurance   Have you contacted your pharmacy to request a refill? Yes   Which pharmacy would you like this sent to? [facility]    Patient notified that their request is being sent to the clinical staff for review and that they should receive a response within 2 business days.   Please advise at Mobile [phone] (mobile)

## 2022-09-06 NOTE — Telephone Encounter (Signed)
Cook, Jayce G, DO     Rx was sent.    

## 2022-09-13 DIAGNOSIS — Z1283 Encounter for screening for malignant neoplasm of skin: Secondary | ICD-10-CM | POA: Diagnosis not present

## 2022-09-13 DIAGNOSIS — D225 Melanocytic nevi of trunk: Secondary | ICD-10-CM | POA: Diagnosis not present

## 2022-09-13 DIAGNOSIS — L820 Inflamed seborrheic keratosis: Secondary | ICD-10-CM | POA: Diagnosis not present

## 2022-10-18 DIAGNOSIS — H04129 Dry eye syndrome of unspecified lacrimal gland: Secondary | ICD-10-CM | POA: Diagnosis not present

## 2022-10-18 DIAGNOSIS — Z809 Family history of malignant neoplasm, unspecified: Secondary | ICD-10-CM | POA: Diagnosis not present

## 2022-10-18 DIAGNOSIS — F419 Anxiety disorder, unspecified: Secondary | ICD-10-CM | POA: Diagnosis not present

## 2022-10-18 DIAGNOSIS — K219 Gastro-esophageal reflux disease without esophagitis: Secondary | ICD-10-CM | POA: Diagnosis not present

## 2022-10-18 DIAGNOSIS — R32 Unspecified urinary incontinence: Secondary | ICD-10-CM | POA: Diagnosis not present

## 2022-10-18 DIAGNOSIS — N182 Chronic kidney disease, stage 2 (mild): Secondary | ICD-10-CM | POA: Diagnosis not present

## 2022-10-18 DIAGNOSIS — J45909 Unspecified asthma, uncomplicated: Secondary | ICD-10-CM | POA: Diagnosis not present

## 2022-10-18 DIAGNOSIS — I129 Hypertensive chronic kidney disease with stage 1 through stage 4 chronic kidney disease, or unspecified chronic kidney disease: Secondary | ICD-10-CM | POA: Diagnosis not present

## 2022-10-18 DIAGNOSIS — Z8249 Family history of ischemic heart disease and other diseases of the circulatory system: Secondary | ICD-10-CM | POA: Diagnosis not present

## 2022-10-18 DIAGNOSIS — K5900 Constipation, unspecified: Secondary | ICD-10-CM | POA: Diagnosis not present

## 2022-10-18 DIAGNOSIS — Z880 Allergy status to penicillin: Secondary | ICD-10-CM | POA: Diagnosis not present

## 2022-10-18 DIAGNOSIS — E785 Hyperlipidemia, unspecified: Secondary | ICD-10-CM | POA: Diagnosis not present

## 2022-10-23 ENCOUNTER — Other Ambulatory Visit: Payer: Self-pay | Admitting: Family Medicine

## 2022-10-31 ENCOUNTER — Other Ambulatory Visit: Payer: Self-pay | Admitting: Family Medicine

## 2022-10-31 DIAGNOSIS — F419 Anxiety disorder, unspecified: Secondary | ICD-10-CM

## 2022-11-09 ENCOUNTER — Other Ambulatory Visit: Payer: Self-pay | Admitting: Family Medicine

## 2022-12-14 ENCOUNTER — Telehealth: Payer: Self-pay

## 2022-12-14 NOTE — Telephone Encounter (Signed)
Pt needs lad work done before her next appt on on 10/31 Pt call back [phone]

## 2022-12-17 ENCOUNTER — Other Ambulatory Visit: Payer: Self-pay

## 2022-12-17 DIAGNOSIS — E782 Mixed hyperlipidemia: Secondary | ICD-10-CM

## 2022-12-17 DIAGNOSIS — R7303 Prediabetes: Secondary | ICD-10-CM

## 2022-12-17 DIAGNOSIS — I10 Essential (primary) hypertension: Secondary | ICD-10-CM

## 2022-12-17 DIAGNOSIS — Z79899 Other long term (current) drug therapy: Secondary | ICD-10-CM

## 2023-01-01 ENCOUNTER — Other Ambulatory Visit: Payer: Self-pay | Admitting: Family Medicine

## 2023-01-11 ENCOUNTER — Other Ambulatory Visit: Payer: Self-pay | Admitting: Podiatry

## 2023-01-11 ENCOUNTER — Ambulatory Visit (INDEPENDENT_AMBULATORY_CARE_PROVIDER_SITE_OTHER): Payer: Medicare HMO | Admitting: Podiatry

## 2023-01-11 DIAGNOSIS — Z79899 Other long term (current) drug therapy: Secondary | ICD-10-CM | POA: Diagnosis not present

## 2023-01-11 DIAGNOSIS — B351 Tinea unguium: Secondary | ICD-10-CM

## 2023-01-11 NOTE — Progress Notes (Signed)
Subjective:  Patient ID: Breanna Mason, female    DOB: April 02, 1946,  MRN: [id number]  Chief Complaint  Patient presents with   Nail Problem    Nail discoloration     77 y.o. female presents with the above complaint.  Patient presents with right hallux thickened elongated dystrophic mycotic nail x 1.  She wanted get it evaluated she has not seen MRIs prior to seeing me.  She has not tried any treatment options for it.  0 out of 10 pain scale.   Review of Systems: Negative except as noted in the HPI. Denies N/V/F/Ch.  Past Medical History:  Diagnosis Date   Allergy    Anxiety    Arthritis    Asthma    Essential hypertension, benign    GERD (gastroesophageal reflux disease)    IFG (impaired fasting glucose)    Impaired glucose tolerance    Mixed hyperlipidemia    Osteopenia    Osteoporosis    Pneumonia    hx   PONV (postoperative nausea and vomiting)    Reflux     Current Outpatient Medications:    acetaminophen (TYLENOL) 500 MG tablet, Take 500-1,000 mg by mouth every 6 (six) hours as needed (for headaches/pain.)., Disp: , Rfl:    albuterol (VENTOLIN HFA) 108 (90 Base) MCG/ACT inhaler, Inhale 1-2 puffs into the lungs every 6 (six) hours as needed for wheezing or shortness of breath., Disp: 8.5 g, Rfl: 3   ALPRAZolam (XANAX) 0.5 MG tablet, TAKE 1/2 TO 1 (ONE-HALF TO ONE) TABLET BY MOUTH TWICE DAILY AS NEEDED FOR ANXIETY, Disp: 30 tablet, Rfl: 0   atorvastatin (LIPITOR) 40 MG tablet, Take 1 tablet by mouth once daily, Disp: 90 tablet, Rfl: 0   calcium-vitamin D (OSCAL WITH D) 500-200 MG-UNIT per tablet, Take 1 tablet by mouth daily. , Disp: , Rfl:    docusate sodium (COLACE) 50 MG capsule, Take 50 mg by mouth daily as needed for mild constipation. , Disp: , Rfl:    enalapril (VASOTEC) 10 MG tablet, Take 1 tablet (10 mg total) by mouth at bedtime., Disp: 90 tablet, Rfl: 3   famotidine (PEPCID) 40 MG tablet, Take 1 tablet (40 mg total) by mouth daily. Prn acid reflux, Disp: 90  tablet, Rfl: 3   Fluticasone Furoate (ARNUITY ELLIPTA) 200 MCG/ACT AEPB, Inhale 200 mcg into the lungs daily., Disp: 30 each, Rfl: 11   meclizine (ANTIVERT) 25 MG tablet, TAKE 1 TABLET BY MOUTH TWICE DAILY AS NEEDED FOR DIZZINESS, Disp: 30 tablet, Rfl: 0  Social History   Tobacco Use  Smoking Status Never  Smokeless Tobacco Never    Allergies  Allergen Reactions   Levofloxacin Nausea And Vomiting   Other Other (See Comments)    Anesthesia-n/v   Penicillins Nausea And Vomiting    " Makes me sick" can take cephalosporins    Objective:  There were no vitals filed for this visit. There is no height or weight on file to calculate BMI. Constitutional Well developed. Well nourished.  Vascular Dorsalis pedis pulses palpable bilaterally. Posterior tibial pulses palpable bilaterally. Capillary refill normal to all digits.  No cyanosis or clubbing noted. Pedal hair growth normal.  Neurologic Normal speech. Oriented to person, place, and time. Epicritic sensation to light touch grossly present bilaterally.  Dermatologic Nails thickened elongated dystrophic mycotic toenails x 1 right hallux.  Mild pain on palpation. Skin within normal limits  Orthopedic: Normal joint ROM without pain or crepitus bilaterally. No visible deformities. No bony tenderness.  Radiographs: None Assessment:   1. Long-term use of high-risk medication   2. Nail fungus   3. Onychomycosis due to dermatophyte    Plan:  Patient was evaluated and treated and all questions answered.  Right hallux onychomycosis -Educated the patient on the etiology of onychomycosis and various treatment options associated with improving the fungal load.  I explained to the patient that there is 3 treatment options available to treat the onychomycosis including topical, p.o., laser treatment.  Patient elected to undergo p.o. options with Lamisil/terbinafine therapy.  In order for me to start the medication therapy, I explained to  the patient the importance of evaluating the liver and obtaining the liver function test.  Once the liver function test comes back normal I will start him on 71-month course of Lamisil therapy.  Patient understood all risk and would like to proceed with Lamisil therapy.  I have asked the patient to immediately stop the Lamisil therapy if she has any reactions to it and call the office or go to the emergency room right away.  Patient states understanding   No follow-ups on file.

## 2023-01-14 LAB — HEPATIC FUNCTION PANEL
ALT: 36 IU/L — ABNORMAL HIGH (ref 0–32)
AST: 26 IU/L (ref 0–40)
Albumin: 4.7 g/dL (ref 3.8–4.8)
Alkaline Phosphatase: 82 IU/L (ref 44–121)
Bilirubin Total: 0.6 mg/dL (ref 0.0–1.2)
Bilirubin, Direct: 0.15 mg/dL (ref 0.00–0.40)
Total Protein: 7.2 g/dL (ref 6.0–8.5)

## 2023-01-14 LAB — SPECIMEN STATUS REPORT

## 2023-01-15 MED ORDER — TERBINAFINE HCL 250 MG PO TABS: 250.0000 mg | ORAL_TABLET | Freq: Every day | ORAL | 0 refills | Status: DC

## 2023-01-15 NOTE — Addendum Note (Signed)
Addended by: Nicholes Rough on: 01/15/2023 08:21 AM   Modules accepted: Orders

## 2023-02-05 ENCOUNTER — Other Ambulatory Visit (HOSPITAL_COMMUNITY): Payer: Self-pay | Admitting: Family Medicine

## 2023-02-05 DIAGNOSIS — Z1231 Encounter for screening mammogram for malignant neoplasm of breast: Secondary | ICD-10-CM

## 2023-03-01 DIAGNOSIS — Z79899 Other long term (current) drug therapy: Secondary | ICD-10-CM | POA: Diagnosis not present

## 2023-03-01 DIAGNOSIS — R7303 Prediabetes: Secondary | ICD-10-CM | POA: Diagnosis not present

## 2023-03-01 DIAGNOSIS — I10 Essential (primary) hypertension: Secondary | ICD-10-CM | POA: Diagnosis not present

## 2023-03-01 DIAGNOSIS — E782 Mixed hyperlipidemia: Secondary | ICD-10-CM | POA: Diagnosis not present

## 2023-03-02 ENCOUNTER — Other Ambulatory Visit: Payer: Self-pay

## 2023-03-02 ENCOUNTER — Emergency Department (HOSPITAL_COMMUNITY)
Admission: EM | Admit: 2023-03-02 | Discharge: 2023-03-03 | Disposition: A | Discharge status: Home / Self Care | Payer: Medicare HMO | Attending: Emergency Medicine | Admitting: Emergency Medicine

## 2023-03-02 ENCOUNTER — Encounter (HOSPITAL_COMMUNITY): Payer: Self-pay

## 2023-03-02 DIAGNOSIS — Z7951 Long term (current) use of inhaled steroids: Secondary | ICD-10-CM | POA: Insufficient documentation

## 2023-03-02 DIAGNOSIS — Z79899 Other long term (current) drug therapy: Secondary | ICD-10-CM | POA: Insufficient documentation

## 2023-03-02 DIAGNOSIS — R109 Unspecified abdominal pain: Secondary | ICD-10-CM | POA: Diagnosis not present

## 2023-03-02 DIAGNOSIS — R310 Gross hematuria: Secondary | ICD-10-CM | POA: Diagnosis not present

## 2023-03-02 DIAGNOSIS — I10 Essential (primary) hypertension: Secondary | ICD-10-CM | POA: Diagnosis not present

## 2023-03-02 DIAGNOSIS — R319 Hematuria, unspecified: Secondary | ICD-10-CM | POA: Diagnosis present

## 2023-03-02 DIAGNOSIS — J45909 Unspecified asthma, uncomplicated: Secondary | ICD-10-CM | POA: Diagnosis not present

## 2023-03-02 LAB — URINALYSIS, ROUTINE W REFLEX MICROSCOPIC
Glucose, UA: 100 mg/dL — AB
Ketones, ur: NEGATIVE mg/dL
Nitrite: POSITIVE — AB
Protein, ur: 100 mg/dL — AB
Specific Gravity, Urine: 1.015 (ref 1.005–1.030)
pH: 7 (ref 5.0–8.0)

## 2023-03-02 LAB — COMPREHENSIVE METABOLIC PANEL
ALT: 27 [IU]/L (ref 0–32)
AST: 23 [IU]/L (ref 0–40)
Albumin: 4.4 g/dL (ref 3.8–4.8)
Alkaline Phosphatase: 80 [IU]/L (ref 44–121)
BUN/Creatinine Ratio: 24 (ref 12–28)
BUN: 17 mg/dL (ref 8–27)
Bilirubin Total: 0.8 mg/dL (ref 0.0–1.2)
CO2: 24 mmol/L (ref 20–29)
Calcium: 9.3 mg/dL (ref 8.7–10.3)
Chloride: 105 mmol/L (ref 96–106)
Creatinine, Ser: 0.71 mg/dL (ref 0.57–1.00)
Globulin, Total: 2.5 g/dL (ref 1.5–4.5)
Glucose: 110 mg/dL — ABNORMAL HIGH (ref 70–99)
Potassium: 4.5 mmol/L (ref 3.5–5.2)
Sodium: 142 mmol/L (ref 134–144)
Total Protein: 6.9 g/dL (ref 6.0–8.5)
eGFR: 88 mL/min/{1.73_m2} (ref >59)

## 2023-03-02 LAB — URINALYSIS, MICROSCOPIC (REFLEX): RBC / HPF: >50 RBC/hpf (ref 0–5)

## 2023-03-02 LAB — LIPID PANEL
Chol/HDL Ratio: 2.9 {ratio} (ref 0.0–4.4)
Cholesterol, Total: 196 mg/dL (ref 100–199)
HDL: 67 mg/dL (ref >39)
LDL Chol Calc (NIH): 113 mg/dL — ABNORMAL HIGH (ref 0–99)
Triglycerides: 92 mg/dL (ref 0–149)
VLDL Cholesterol Cal: 16 mg/dL (ref 5–40)

## 2023-03-02 LAB — CBC WITH DIFFERENTIAL/PLATELET
Basophils Absolute: 0 10*3/uL (ref 0.0–0.2)
Basos: 1 %
EOS (ABSOLUTE): 0.1 10*3/uL (ref 0.0–0.4)
Eos: 2 %
Hematocrit: 45.3 % (ref 34.0–46.6)
Hemoglobin: 15.2 g/dL (ref 11.1–15.9)
Immature Grans (Abs): 0 10*3/uL (ref 0.0–0.1)
Immature Granulocytes: 0 %
Lymphocytes Absolute: 1.3 10*3/uL (ref 0.7–3.1)
Lymphs: 30 %
MCH: 31.7 pg (ref 26.6–33.0)
MCHC: 33.6 g/dL (ref 31.5–35.7)
MCV: 94 fL (ref 79–97)
Monocytes Absolute: 0.7 10*3/uL (ref 0.1–0.9)
Monocytes: 15 %
Neutrophils Absolute: 2.3 10*3/uL (ref 1.4–7.0)
Neutrophils: 52 %
Platelets: 237 10*3/uL (ref 150–450)
RBC: 4.8 x10E6/uL (ref 3.77–5.28)
RDW: 12.8 % (ref 11.7–15.4)
WBC: 4.4 10*3/uL (ref 3.4–10.8)

## 2023-03-02 LAB — HEMOGLOBIN A1C
Est. average glucose Bld gHb Est-mCnc: 123 mg/dL
Hgb A1c MFr Bld: 5.9 % — ABNORMAL HIGH (ref 4.8–5.6)

## 2023-03-02 NOTE — ED Triage Notes (Signed)
Urine frequency with blood in urine started at 7pm   Denied ABD pain

## 2023-03-03 DIAGNOSIS — R310 Gross hematuria: Secondary | ICD-10-CM | POA: Diagnosis not present

## 2023-03-03 MED ORDER — CEPHALEXIN 500 MG PO CAPS: 500.0000 mg | ORAL_CAPSULE | Freq: Two times a day (BID) | ORAL | 0 refills | Status: DC

## 2023-03-03 MED ORDER — CEPHALEXIN 500 MG PO CAPS
500.0000 mg | ORAL_CAPSULE | Freq: Once | ORAL | Status: AC
  Administered 2023-03-03: 500 mg via ORAL
  Filled 2023-03-03: qty 1

## 2023-03-03 NOTE — ED Provider Notes (Signed)
[facility] EMERGENCY DEPARTMENT AT [facility] Provider Note   CSN: [id number] Arrival date & time: 03/02/23  2235     History  Chief Complaint  Patient presents with   Hematuria    Breanna Mason is a 77 y.o. female.  The history is provided by the patient.  Hematuria This is a new problem. The current episode started 3 to 5 hours ago. The problem has been gradually improving. Associated symptoms include abdominal pain. She has tried nothing for the symptoms.   Patient presents for hematuria.  Patient reports after going out to dinner tonight, she was laying down to rest when she had the urge to urinate.  She then noticed she had hematuria.  She has had multiple episodes since that time.  She had initial abdominal pain that is now improving.  No fevers or vomiting.  No back pain.  She is not on anticoagulation.  No previous history of kidney stones or any other urologic issues Patient is a non-smoker   Past Medical History:  Diagnosis Date   Allergy    Anxiety    Arthritis    Asthma    Essential hypertension, benign    GERD (gastroesophageal reflux disease)    IFG (impaired fasting glucose)    Impaired glucose tolerance    Mixed hyperlipidemia    Osteopenia    Osteoporosis    Pneumonia    hx   PONV (postoperative nausea and vomiting)    Reflux     Home Medications Prior to Admission medications   Medication Sig Start Date End Date Taking? Authorizing Provider  cephALEXin (KEFLEX) 500 MG capsule Take 1 capsule (500 mg total) by mouth 2 (two) times daily. 03/03/23  Yes Zadie Rhine, MD  acetaminophen (TYLENOL) 500 MG tablet Take 500-1,000 mg by mouth every 6 (six) hours as needed (for headaches/pain.).    [provider]  albuterol (VENTOLIN HFA) 108 (90 Base) MCG/ACT inhaler Inhale 1-2 puffs into the lungs every 6 (six) hours as needed for wheezing or shortness of breath. 08/31/22   Adriana Simas, Verdis Frederickson, DO  ALPRAZolam (XANAX) 0.5 MG tablet TAKE 1/2  TO 1 (ONE-HALF TO ONE) TABLET BY MOUTH TWICE DAILY AS NEEDED FOR ANXIETY 10/31/22   Tommie Sams, DO  atorvastatin (LIPITOR) 40 MG tablet Take 1 tablet by mouth once daily 01/02/23   Cook, Dorie Rank G, DO  calcium-vitamin D (OSCAL WITH D) 500-200 MG-UNIT per tablet Take 1 tablet by mouth daily.     [provider]  docusate sodium (COLACE) 50 MG capsule Take 50 mg by mouth daily as needed for mild constipation.     [provider]  enalapril (VASOTEC) 10 MG tablet Take 1 tablet (10 mg total) by mouth at bedtime. 08/31/22   Tommie Sams, DO  famotidine (PEPCID) 40 MG tablet Take 1 tablet (40 mg total) by mouth daily. Prn acid reflux 08/31/22   Everlene Other G, DO  Fluticasone Furoate (ARNUITY ELLIPTA) 200 MCG/ACT AEPB Inhale 200 mcg into the lungs daily. 09/05/22   Tommie Sams, DO  meclizine (ANTIVERT) 25 MG tablet TAKE 1 TABLET BY MOUTH TWICE DAILY AS NEEDED FOR DIZZINESS 11/09/22   Babs Sciara, MD  terbinafine (LAMISIL) 250 MG tablet Take 1 tablet (250 mg total) by mouth daily. 01/15/23   Candelaria Stagers, DPM      Allergies    Levofloxacin, Other, and Penicillins    Review of Systems   Review of Systems  Gastrointestinal:  Positive for abdominal pain.  Genitourinary:  Positive for hematuria.    Physical Exam Updated Vital Signs BP (!) 171/103   Pulse 98   Temp 98.1 F (36.7 C) (Oral)   Resp 16   Ht 1.6 m (5\' 3" )   Wt 72.6 kg   SpO2 95%   BMI 28.34 kg/m  Physical Exam CONSTITUTIONAL: Elderly, well-appearing, no acute distress HEAD: Normocephalic/atraumatic EYES: EOMI/PERRL ABDOMEN: soft, nontender, no rebound or guarding, bowel sounds noted throughout abdomen GU:no cva tenderness NEURO: Pt is awake/alert/appropriate, moves all extremitiesx4.  No facial droop.   EXTREMITIES: pulses normal/equal, full ROM SKIN: warm, color normal PSYCH: no abnormalities of mood noted, alert and oriented to situation  ED Results / Procedures / Treatments   Labs (all labs ordered  are listed, but only abnormal results are displayed) Labs Reviewed  URINALYSIS, ROUTINE W REFLEX MICROSCOPIC - Abnormal; Notable for the following components:      Result Value   Color, Urine RED (*)    APPearance CLOUDY (*)    Glucose, UA 100 (*)    Hgb urine dipstick LARGE (*)    Bilirubin Urine SMALL (*)    Protein, ur 100 (*)    Nitrite POSITIVE (*)    Leukocytes,Ua LARGE (*)    All other components within normal limits  URINALYSIS, MICROSCOPIC (REFLEX) - Abnormal; Notable for the following components:   Bacteria, UA RARE (*)    All other components within normal limits    EKG None  Radiology No results found.  Procedures Procedures    Medications Ordered in ED Medications  cephALEXin (KEFLEX) capsule 500 mg (500 mg Oral Given 03/03/23 0154)    ED Course/ Medical Decision Making/ A&P Clinical Course as of 03/03/23 0159  Sun Mar 03, 2023  0139 Patient presents for hematuria that she has never had previously.  She denies any vaginal bleeding or rectal bleeding  Urinalysis consistent with hematuria, will start on antibiotics.  Advised if no improvement in the next week she would need to have urology follow-up for further exploration including possible bladder tumor [DW]  0158 Bladder scan showed around 240 mL.  Patient was able to urinate afterwards without any difficulty.  She has no signs of urinary obstruction at this time.  Patient is well-appearing.  She will be discharged home and advised close follow-up with urology if no improvement [DW]    Clinical Course User Index [DW] Zadie Rhine, MD                                 Medical Decision Making Amount and/or Complexity of Data Reviewed Labs: ordered.  Risk Prescription drug management.           Final Clinical Impression(s) / ED Diagnoses Final diagnoses:  Gross hematuria    Rx / DC Orders ED Discharge Orders          Ordered    cephALEXin (KEFLEX) 500 MG capsule  2 times daily         03/03/23 0136              Zadie Rhine, MD 03/03/23 0159

## 2023-03-04 ENCOUNTER — Ambulatory Visit: Payer: 59 | Admitting: Family Medicine

## 2023-03-05 ENCOUNTER — Other Ambulatory Visit: Payer: Self-pay

## 2023-03-05 ENCOUNTER — Other Ambulatory Visit: Payer: Self-pay | Admitting: Family Medicine

## 2023-03-05 MED ORDER — ATORVASTATIN CALCIUM 80 MG PO TABS: 80.0000 mg | ORAL_TABLET | Freq: Every day | ORAL | 3 refills | Status: DC

## 2023-03-07 ENCOUNTER — Ambulatory Visit: Payer: 59 | Admitting: Family Medicine

## 2023-03-07 ENCOUNTER — Ambulatory Visit (INDEPENDENT_AMBULATORY_CARE_PROVIDER_SITE_OTHER): Payer: 59 | Admitting: Family Medicine

## 2023-03-07 VITALS — BP 142/88 | HR 97 | Temp 97.3°F | Ht 63.0 in | Wt 162.0 lb

## 2023-03-07 DIAGNOSIS — I10 Essential (primary) hypertension: Secondary | ICD-10-CM | POA: Diagnosis not present

## 2023-03-07 DIAGNOSIS — E782 Mixed hyperlipidemia: Secondary | ICD-10-CM

## 2023-03-07 DIAGNOSIS — R319 Hematuria, unspecified: Secondary | ICD-10-CM | POA: Insufficient documentation

## 2023-03-07 DIAGNOSIS — H04123 Dry eye syndrome of bilateral lacrimal glands: Secondary | ICD-10-CM | POA: Diagnosis not present

## 2023-03-07 DIAGNOSIS — Z23 Encounter for immunization: Secondary | ICD-10-CM

## 2023-03-07 LAB — POCT URINALYSIS DIP (CLINITEK)
Bilirubin, UA: NEGATIVE
Blood, UA: NEGATIVE
Glucose, UA: NEGATIVE mg/dL
Ketones, POC UA: NEGATIVE mg/dL
Leukocytes, UA: NEGATIVE
Nitrite, UA: NEGATIVE
POC PROTEIN,UA: NEGATIVE
Spec Grav, UA: 1.02 (ref 1.010–1.025)
Urobilinogen, UA: 0.2 U/dL
pH, UA: 6 (ref 5.0–8.0)

## 2023-03-07 NOTE — Assessment & Plan Note (Signed)
Fair control.  Continue enalapril.

## 2023-03-07 NOTE — Patient Instructions (Signed)
Stop Lipitor.  Recheck lipids in 3 months.  Sending urine for culture.  Follow up in 3-6 months.

## 2023-03-07 NOTE — Assessment & Plan Note (Signed)
Sending culture.

## 2023-03-07 NOTE — Progress Notes (Signed)
Subjective:  Patient ID: Breanna Mason, female    DOB: August 03, 1945  Age: 77 y.o. MRN: [id number]  CC:   Chief Complaint  Patient presents with   Hyperlipidemia   Hypertension   Hematuria    Follow up     HPI:  77 year old female presents for follow-up.  Fair control of hypertension given age.  She is on enalapril and tolerating well.  Patient is not tolerating Lipitor.  Will discontinue.  Patient recently went to the ER for hematuria.  No CT scan was done.  She was treated empirically for UTI with Keflex.  No culture was obtained.  She is currently pain-free and not having any urinary symptoms.  Needs recheck of urine today.  Patient Active Problem List   Diagnosis Date Noted   Hematuria 03/07/2023   Anxiety 03/03/2021   Osteoporosis without current pathological fracture 08/29/2020   Prediabetes 09/08/2017   S/P shoulder replacement 03/11/2014   Essential hypertension, benign 03/06/2011   Mixed hyperlipidemia 03/06/2011   Asthma 03/06/2011    Social Hx   Social History   Socioeconomic History   Marital status: Married    Spouse name: Not on file   Number of children: Not on file   Years of education: Not on file   Highest education level: Not on file  Occupational History   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  Tobacco Use   Smoking status: Never   Smokeless tobacco: Never  Vaping Use   Vaping status: Never Used  Substance and Sexual Activity   Alcohol use: No   Drug use: No   Sexual activity: Not Currently    Birth control/protection: Post-menopausal  Other Topics Concern   Not on file  Social History Narrative   Not on file   Social Determinants of Health   Financial Resource Strain: Low Risk  (06/15/2022)   Overall Financial Resource Strain (CARDIA)    Difficulty of Paying Living Expenses: Not hard at all  Food Insecurity: No Food Insecurity (06/15/2022)   Hunger Vital Sign    Worried About Running Out of Food in the Last Year: Never  true    Ran Out of Food in the Last Year: Never true  Transportation Needs: No Transportation Needs (06/15/2022)   PRAPARE - Administrator, Civil Service (Medical): No    Lack of Transportation (Non-Medical): No  Physical Activity: Insufficiently Active (06/15/2022)   Exercise Vital Sign    Days of Exercise per Week: 3 days    Minutes of Exercise per Session: 30 min  Stress: No Stress Concern Present (06/15/2022)   [employer] of Occupational Health - Occupational Stress Questionnaire    Feeling of Stress : Not at all  Social Connections: Socially Integrated (06/15/2022)   Social Connection and Isolation Panel [NHANES]    Frequency of Communication with Friends and Family: More than three times a week    Frequency of Social Gatherings with Friends and Family: More than three times a week    Attends Religious Services: More than 4 times per year    Active Member of [employer] or Organizations: Yes    Attends Engineer, structural: More than 4 times per year    Marital Status: Married    Review of Systems Per HPI  Objective:  BP (!) 142/88   Pulse 97   Temp (!) 97.3 F (36.3 C)   Ht 5\' 3"  (1.6 m)   Wt 162 lb (73.5 kg)   SpO2  97%   BMI 28.70 kg/m      03/07/2023    1:16 PM 03/03/2023    2:07 AM 03/02/2023   10:40 PM  BP/Weight  Systolic BP 142 153 171  Diastolic BP 88 79 103  Wt. (Lbs) 162  160  BMI 28.7 kg/m2  28.34 kg/m2    Physical Exam Vitals and nursing note reviewed.  Constitutional:      General: She is not in acute distress.    Appearance: Normal appearance.  HENT:     Head: Normocephalic and atraumatic.  Cardiovascular:     Rate and Rhythm: Normal rate and regular rhythm.  Pulmonary:     Effort: Pulmonary effort is normal.     Breath sounds: Normal breath sounds. No wheezing or rales.  Abdominal:     General: There is no distension.     Palpations: Abdomen is soft.     Tenderness: There is no abdominal tenderness.  Neurological:      Mental Status: She is alert.  Psychiatric:        Mood and Affect: Mood normal.        Behavior: Behavior normal.     Lab Results  Component Value Date   WBC 4.4 03/01/2023   HGB 15.2 03/01/2023   HCT 45.3 03/01/2023   PLT 237 03/01/2023   GLUCOSE 110 (H) 03/01/2023   CHOL 196 03/01/2023   TRIG 92 03/01/2023   HDL 67 03/01/2023   LDLCALC 113 (H) 03/01/2023   ALT 27 03/01/2023   AST 23 03/01/2023   NA 142 03/01/2023   K 4.5 03/01/2023   CL 105 03/01/2023   CREATININE 0.71 03/01/2023   BUN 17 03/01/2023   CO2 24 03/01/2023   TSH 2.320 02/11/2019   HGBA1C 5.9 (H) 03/01/2023     Assessment & Plan:   Problem List Items Addressed This Visit       Cardiovascular and Mediastinum   Essential hypertension, benign    Fair control.  Continue enalapril.        Other   Mixed hyperlipidemia    Having arthralgias due to atorvastatin.  Stopping today.  Recheck lipids in 3 months.      Relevant Orders   Lipid panel   Hematuria - Primary    Sending culture.      Relevant Orders   Urine Culture   POCT URINALYSIS DIP (CLINITEK) (Completed)   Other Visit Diagnoses     Immunization due       Relevant Orders   Flu Vaccine Trivalent High Dose (Fluad)      Follow-up:  3 months  Elektra Wartman Adriana Simas DO [facility]

## 2023-03-07 NOTE — Assessment & Plan Note (Addendum)
Having arthralgias due to atorvastatin.  Stopping today.  Recheck lipids in 3 months.

## 2023-03-09 LAB — URINE CULTURE: Organism ID, Bacteria: NO GROWTH

## 2023-03-09 LAB — SPECIMEN STATUS REPORT

## 2023-03-21 DIAGNOSIS — D2271 Melanocytic nevi of right lower limb, including hip: Secondary | ICD-10-CM | POA: Diagnosis not present

## 2023-03-21 DIAGNOSIS — Z1283 Encounter for screening for malignant neoplasm of skin: Secondary | ICD-10-CM | POA: Diagnosis not present

## 2023-03-21 DIAGNOSIS — L820 Inflamed seborrheic keratosis: Secondary | ICD-10-CM | POA: Diagnosis not present

## 2023-03-21 DIAGNOSIS — X32XXXD Exposure to sunlight, subsequent encounter: Secondary | ICD-10-CM | POA: Diagnosis not present

## 2023-03-21 DIAGNOSIS — L570 Actinic keratosis: Secondary | ICD-10-CM | POA: Diagnosis not present

## 2023-03-21 DIAGNOSIS — D225 Melanocytic nevi of trunk: Secondary | ICD-10-CM | POA: Diagnosis not present

## 2023-03-21 DIAGNOSIS — D485 Neoplasm of uncertain behavior of skin: Secondary | ICD-10-CM | POA: Diagnosis not present

## 2023-03-25 ENCOUNTER — Ambulatory Visit (HOSPITAL_COMMUNITY)
Admission: RE | Admit: 2023-03-25 | Discharge: 2023-03-25 | Disposition: A | Discharge status: Home / Self Care | Payer: Medicare HMO | Source: Ambulatory Visit | Attending: Family Medicine | Admitting: Family Medicine

## 2023-03-25 DIAGNOSIS — Z1231 Encounter for screening mammogram for malignant neoplasm of breast: Secondary | ICD-10-CM | POA: Insufficient documentation

## 2023-04-03 ENCOUNTER — Other Ambulatory Visit: Payer: Self-pay | Admitting: Family Medicine

## 2023-04-03 DIAGNOSIS — F419 Anxiety disorder, unspecified: Secondary | ICD-10-CM

## 2023-05-09 DIAGNOSIS — Z008 Encounter for other general examination: Secondary | ICD-10-CM | POA: Diagnosis not present

## 2023-05-17 ENCOUNTER — Encounter: Payer: Self-pay | Admitting: Podiatry

## 2023-05-17 ENCOUNTER — Ambulatory Visit (INDEPENDENT_AMBULATORY_CARE_PROVIDER_SITE_OTHER): Payer: Medicare HMO | Admitting: Podiatry

## 2023-05-17 DIAGNOSIS — B351 Tinea unguium: Secondary | ICD-10-CM

## 2023-05-17 DIAGNOSIS — Z79899 Other long term (current) drug therapy: Secondary | ICD-10-CM | POA: Diagnosis not present

## 2023-05-17 NOTE — Progress Notes (Signed)
 Subjective:  Patient ID: Breanna Mason, female    DOB: 05/17/1945,  MRN: [id number]  Chief Complaint  Patient presents with   Routine Post Op    Patient states she is taking half of the pill that the doctor prescribed her. But everything has been ok since last visit     78 y.o. female presents with the above complaint.  Patient presents with right hallux thickened elongated dystrophic mycotic nail x 1.  She wanted get it evaluated she has not seen MRIs prior to seeing me.  She has not tried any treatment options for it.  0 out of 10 pain scale.   Review of Systems: Negative except as noted in the HPI. Denies N/V/F/Ch.  Past Medical History:  Diagnosis Date   Allergy    Anxiety    Arthritis    Asthma    Essential hypertension, benign    GERD (gastroesophageal reflux disease)    IFG (impaired fasting glucose)    Impaired glucose tolerance    Mixed hyperlipidemia    Osteopenia    Osteoporosis    Pneumonia    hx   PONV (postoperative nausea and vomiting)    Reflux     Current Outpatient Medications:    acetaminophen  (TYLENOL ) 500 MG tablet, Take 500-1,000 mg by mouth every 6 (six) hours as needed (for headaches/pain.)., Disp: , Rfl:    albuterol  (VENTOLIN  HFA) 108 (90 Base) MCG/ACT inhaler, Inhale 1-2 puffs into the lungs every 6 (six) hours as needed for wheezing or shortness of breath., Disp: 8.5 g, Rfl: 3   ALPRAZolam  (XANAX ) 0.5 MG tablet, TAKE 1/2 TO 1 (ONE-HALF TO ONE) TABLET BY MOUTH TWICE DAILY AS NEEDED FOR ANXIETY, Disp: 30 tablet, Rfl: 0   calcium -vitamin D  (OSCAL WITH D) 500-200 MG-UNIT per tablet, Take 1 tablet by mouth daily. , Disp: , Rfl:    cephALEXin  (KEFLEX ) 500 MG capsule, Take 1 capsule (500 mg total) by mouth 2 (two) times daily., Disp: 14 capsule, Rfl: 0   docusate sodium  (COLACE) 50 MG capsule, Take 50 mg by mouth daily as needed for mild constipation. , Disp: , Rfl:    enalapril  (VASOTEC ) 10 MG tablet, Take 1 tablet (10 mg total) by mouth at bedtime.,  Disp: 90 tablet, Rfl: 3   famotidine  (PEPCID ) 40 MG tablet, Take 1 tablet (40 mg total) by mouth daily. Prn acid reflux, Disp: 90 tablet, Rfl: 3   Fluticasone  Furoate (ARNUITY ELLIPTA ) 200 MCG/ACT AEPB, Inhale 200 mcg into the lungs daily., Disp: 30 each, Rfl: 11   meclizine  (ANTIVERT ) 25 MG tablet, TAKE 1 TABLET BY MOUTH TWICE DAILY AS NEEDED FOR DIZZINESS, Disp: 30 tablet, Rfl: 0   terbinafine  (LAMISIL ) 250 MG tablet, Take 1 tablet (250 mg total) by mouth daily., Disp: 90 tablet, Rfl: 0  Social History   Tobacco Use  Smoking Status Never  Smokeless Tobacco Never    Allergies  Allergen Reactions   Levofloxacin  Nausea And Vomiting   Other Other (See Comments)    Anesthesia-n/v   Penicillins Nausea And Vomiting     Makes me sick can take cephalosporins    Objective:  There were no vitals filed for this visit. There is no height or weight on file to calculate BMI. Constitutional Well developed. Well nourished.  Vascular Dorsalis pedis pulses palpable bilaterally. Posterior tibial pulses palpable bilaterally. Capillary refill normal to all digits.  No cyanosis or clubbing noted. Pedal hair growth normal.  Neurologic Normal speech. Oriented to person, place, and time.  Epicritic sensation to light touch grossly present bilaterally.  Dermatologic Nails thickened elongated dystrophic mycotic toenails x 1 right hallux.  Mild pain on palpation. Skin within normal limits  Orthopedic: Normal joint ROM without pain or crepitus bilaterally. No visible deformities. No bony tenderness.   Radiographs: None Assessment:   1. Long-term use of high-risk medication   2. Nail fungus   3. Onychomycosis due to dermatophyte     Plan:  Patient was evaluated and treated and all questions answered.  Right hallux onychomycosis -Clinically healed and officially discharged from my care.  At this time patient will discussed with the patient if it reoccurs she will come back and see me.  Her  fungus has resolved.   No follow-ups on file.

## 2023-06-06 ENCOUNTER — Ambulatory Visit: Payer: 59 | Admitting: Family Medicine

## 2023-06-11 ENCOUNTER — Other Ambulatory Visit: Payer: Self-pay

## 2023-06-11 ENCOUNTER — Telehealth: Payer: Self-pay

## 2023-06-11 DIAGNOSIS — E782 Mixed hyperlipidemia: Secondary | ICD-10-CM

## 2023-06-11 DIAGNOSIS — R7303 Prediabetes: Secondary | ICD-10-CM

## 2023-06-11 DIAGNOSIS — Z79899 Other long term (current) drug therapy: Secondary | ICD-10-CM

## 2023-06-11 DIAGNOSIS — I10 Essential (primary) hypertension: Secondary | ICD-10-CM

## 2023-06-11 NOTE — Telephone Encounter (Signed)
 Copied from CRM [id number]. Topic: Clinical - Request for Lab/Test Order >> Jun 11, 2023 10:38 AM Breanna Mason wrote: Reason for CRM: Patient has called to request a call from the office to see if she needs to have labs done before her appointment on the 11th, patient states she was concerned about her cholesterol numbers and if she doesn't have to do blood work until her 6 months then she will reschedule everything for April

## 2023-06-11 NOTE — Telephone Encounter (Signed)
Left a message for a return call to inform of her labs and need to reschedule an appt for around spring.

## 2023-06-11 NOTE — Telephone Encounter (Signed)
 Nurses It would be fine to do all of her lab work in approximately 8 weeks with a follow-up office visit Lipid, liver, metabolic 7, urine ACR, A1c HTN, prediabetes, hyperlipidemia, high risk med She can work hard on her diet continue her current medications assist her with rescheduling her office visit into April May canceled February appointment Do lab work before her office visit in April including urine thank you

## 2023-06-18 ENCOUNTER — Ambulatory Visit: Payer: 59 | Admitting: Family Medicine

## 2023-06-21 ENCOUNTER — Ambulatory Visit (INDEPENDENT_AMBULATORY_CARE_PROVIDER_SITE_OTHER): Payer: Medicare HMO

## 2023-06-21 VITALS — Ht 63.0 in | Wt 160.0 lb

## 2023-06-21 DIAGNOSIS — Z0000 Encounter for general adult medical examination without abnormal findings: Secondary | ICD-10-CM | POA: Diagnosis not present

## 2023-06-21 DIAGNOSIS — Z780 Asymptomatic menopausal state: Secondary | ICD-10-CM | POA: Diagnosis not present

## 2023-06-21 NOTE — Patient Instructions (Signed)
Ms. Fasching , Thank you for taking time to come for your Medicare Wellness Visit. I appreciate your ongoing commitment to your health goals. Please review the following plan we discussed and let me know if I can assist you in the future.   Referrals/Orders/Follow-Ups/Clinician Recommendations:  Next Medicare Annual Wellness Visit: June 26, 2024 at 3:00pm virtual visit  You have an order for:  []   2D Mammogram  []   3D Mammogram  [x]   Bone Density   []   Lung Cancer Screening  Please call for appointment:   [facility] at [facility] [address]iology [place], [place] [address] [phone]  Make sure to wear two-piece clothing.  No lotions powders or deodorants the day of the appointment Make sure to bring picture ID and insurance card.  Bring list of medications you are currently taking including any supplements.   Schedule your [place] screening mammogram through MyChart!   Log into your MyChart account.  Go to 'Visit' (or 'Appointments' if on mobile App) --> Schedule an Appointment  Under 'Select a Reason for Visit' choose the Mammogram Screening option.  Complete the pre-visit questions and select the time and place that best fits your schedule.    This is a list of the screening recommended for you and due dates:  Health Maintenance  Topic Date Due   Zoster (Shingles) Vaccine (2 of 2) 01/17/2023   DEXA scan (bone density measurement)  04/07/2023   DTaP/Tdap/Td vaccine (2 - Tdap) 09/05/2023   Mammogram  03/24/2024   Medicare Annual Wellness Visit  06/20/2024   Colon Cancer Screening  09/16/2024   Pneumonia Vaccine  Completed   Flu Shot  Completed   HPV Vaccine  Aged Out   COVID-19 Vaccine  Discontinued   Hepatitis C Screening  Discontinued    Advanced directives: (ACP Link)Information on Advanced Care Planning can be found at [facility] Advance Health Care Directives Advance Health Care Directives ([email or web address]    Next Medicare Annual Wellness Visit scheduled for next year: yes  Understanding Your Risk for Falls Millions of people have serious injuries from falls each year. It is important to understand your risk of falling. Talk with your health care provider about your risk and what you can do to lower it. If you do have a serious fall, make sure to tell your provider. Falling once raises your risk of falling again. How can falls affect me? Serious injuries from falls are common. These include: Broken bones, such as hip fractures. Head injuries, such as traumatic brain injuries (TBI) or concussions. A fear of falling can cause you to avoid activities and stay at home. This can make your muscles weaker and raise your risk for a fall. What can increase my risk? There are a number of risk factors that increase your risk for falling. The more risk factors you have, the higher your risk of falling. Serious injuries from a fall happen most often to people who are older than 77 years old. Teenagers and young adults ages 40-29 are also at higher risk. Common risk factors include: Weakness in the lower body. Being generally weak or confused due to long-term (chronic) illness. Dizziness or balance problems. Poor vision. Medicines that cause dizziness or drowsiness. These may include: Medicines for your blood pressure, heart, anxiety, insomnia, or swelling (edema). Pain medicines. Muscle relaxants. Other risk factors include: Drinking alcohol. Having had a fall in the past. Having foot pain or wearing improper footwear. Working at  a dangerous job. Having any of the following in your home: Tripping hazards, such as floor clutter or loose rugs. Poor lighting. Pets. Having dementia or memory loss. What actions can I take to lower my risk of falling?     Physical activity Stay physically fit. Do strength and balance exercises. Consider taking a regular class to build strength and balance. Yoga and  tai chi are good options. Vision Have your eyes checked every year and your prescription for glasses or contacts updated as needed. Shoes and walking aids Wear non-skid shoes. Wear shoes that have rubber soles and low heels. Do not wear high heels. Do not walk around the house in socks or slippers. Use a cane or walker as told by your provider. Home safety Attach secure railings on both sides of your stairs. Install grab bars for your bathtub, shower, and toilet. Use a non-skid mat in your bathtub or shower. Attach bath mats securely with double-sided, non-slip rug tape. Use good lighting in all rooms. Keep a flashlight near your bed. Make sure there is a clear path from your bed to the bathroom. Use night-lights. Do not use throw rugs. Make sure all carpeting is taped or tacked down securely. Remove all clutter from walkways and stairways, including extension cords. Repair uneven or broken steps and floors. Avoid walking on icy or slippery surfaces. Walk on the grass instead of on icy or slick sidewalks. Use ice melter to get rid of ice on walkways in the winter. Use a cordless phone. Questions to ask your health care provider Can you help me check my risk for a fall? Do any of my medicines make me more likely to fall? Should I take a vitamin D supplement? What exercises can I do to improve my strength and balance? Should I make an appointment to have my vision checked? Do I need a bone density test to check for weak bones (osteoporosis)? Would it help to use a cane or a walker? Where to find more information Centers for Disease Control and Prevention, STEADI: TonerPromos.no Community-Based Fall Prevention Programs: TonerPromos.no [employer] on Aging: BaseRingTones.pl Contact a health care provider if: You fall at home. You are afraid of falling at home. You feel weak, drowsy, or dizzy. This information is not intended to replace advice given to you by your health care provider. Make sure  you discuss any questions you have with your health care provider. Document Revised: 12/25/2021 Document Reviewed: 12/25/2021 Elsevier Patient Education  2024 [employer].

## 2023-06-21 NOTE — Progress Notes (Signed)
Please attest and cosign this visit due to patients primary care provider not being in the office at the time the visit was completed.  Because this visit was a virtual/telehealth visit,  certain criteria was not obtained, such a blood pressure, CBG if applicable, and timed get up and go. Any medications not marked as "taking" were not mentioned during the medication reconciliation part of the visit. Any vitals not documented were not able to be obtained due to this being a telehealth visit or patient was unable to self-report a recent blood pressure reading due to a lack of equipment at home via telehealth. Vitals that have been documented are verbally provided by the patient.  Interactive audio and video telecommunications were attempted between this provider and patient, however failed, due to patient having technical difficulties OR patient did not have access to video capability.  We continued and completed visit with audio only.  Subjective:   Breanna Mason is a 78 y.o. female who presents for Medicare Annual (Subsequent) preventive examination.  Visit Complete: Virtual I connected with  Breanna Mason on 06/21/23 by a audio enabled telemedicine application and verified that I am speaking with the correct person using two identifiers.  Patient Location: Home  Provider Location: Home Office  I discussed the limitations of evaluation and management by telemedicine. The patient expressed understanding and agreed to proceed.  Vital Signs: Because this visit was a virtual/telehealth visit, some criteria may be missing or patient reported. Any vitals not documented were not able to be obtained and vitals that have been documented are patient reported.  Patient Medicare AWV questionnaire was completed by the patient on 06/21/2023; I have confirmed that all information answered by patient is correct and no changes since this date.  Cardiac Risk Factors include: advanced age (>8men, >17  women);dyslipidemia;hypertension;sedentary lifestyle     Objective:    Today's Vitals   06/21/23 1529  Weight: 160 lb (72.6 kg)  Height: 5\' 3"  (1.6 m)   Body mass index is 28.34 kg/m.     06/21/2023    3:29 PM 03/02/2023   10:42 PM 06/15/2022    3:25 PM 05/30/2021    3:38 PM 08/21/2019    2:59 PM 09/16/2017    8:03 AM 07/01/2017    9:07 AM  Advanced Directives  Does Patient Have a Medical Advance Directive? No No Yes Yes No No Yes  Type of Chief of Staff of [place];Living will     Does patient want to make changes to medical advance directive?   No - Patient declined      Copy of Healthcare Power of Attorney in Chart?   No - copy requested No - copy requested     Would patient like information on creating a medical advance directive? No - Patient declined    No - Patient declined No - Patient declined     Current Medications (verified) Outpatient Encounter Medications as of 06/21/2023  Medication Sig   acetaminophen (TYLENOL) 500 MG tablet Take 500-1,000 mg by mouth every 6 (six) hours as needed (for headaches/pain.).   albuterol (VENTOLIN HFA) 108 (90 Base) MCG/ACT inhaler Inhale 1-2 puffs into the lungs every 6 (six) hours as needed for wheezing or shortness of breath.   ALPRAZolam (XANAX) 0.5 MG tablet TAKE 1/2 TO 1 (ONE-HALF TO ONE) TABLET BY MOUTH TWICE DAILY AS NEEDED FOR ANXIETY   calcium-vitamin D (OSCAL WITH D) 500-200 MG-UNIT per tablet Take 1 tablet  by mouth daily.    cephALEXin (KEFLEX) 500 MG capsule Take 1 capsule (500 mg total) by mouth 2 (two) times daily.   docusate sodium (COLACE) 50 MG capsule Take 50 mg by mouth daily as needed for mild constipation.    enalapril (VASOTEC) 10 MG tablet Take 1 tablet (10 mg total) by mouth at bedtime.   famotidine (PEPCID) 40 MG tablet Take 1 tablet (40 mg total) by mouth daily. Prn acid reflux   Fluticasone Furoate (ARNUITY ELLIPTA) 200 MCG/ACT AEPB Inhale 200 mcg into the lungs  daily.   meclizine (ANTIVERT) 25 MG tablet TAKE 1 TABLET BY MOUTH TWICE DAILY AS NEEDED FOR DIZZINESS   terbinafine (LAMISIL) 250 MG tablet Take 1 tablet (250 mg total) by mouth daily.   No facility-administered encounter medications on file as of 06/21/2023.    Allergies (verified) Levofloxacin, Other, and Penicillins   History: Past Medical History:  Diagnosis Date   Allergy    Anxiety    Arthritis    Asthma    Essential hypertension, benign    GERD (gastroesophageal reflux disease)    IFG (impaired fasting glucose)    Impaired glucose tolerance    Mixed hyperlipidemia    Osteopenia    Osteoporosis    Pneumonia    hx   PONV (postoperative nausea and vomiting)    Reflux    Past Surgical History:  Procedure Laterality Date   Bilateral tubal ligation     CARPAL TUNNEL RELEASE Right    31 yrs   CATARACT EXTRACTION W/PHACO Right 06/17/2017   Procedure: CATARACT EXTRACTION PHACO AND INTRAOCULAR LENS PLACEMENT RIGHT EYE;  Surgeon: Gemma Payor, MD;  Location: [facility];  Service: Ophthalmology;  Laterality: Right;  CDE: 17.57   CATARACT EXTRACTION W/PHACO Left 07/01/2017   Procedure: CATARACT EXTRACTION PHACO AND INTRAOCULAR LENS PLACEMENT (IOC);  Surgeon: Gemma Payor, MD;  Location: [facility];  Service: Ophthalmology;  Laterality: Left;  CDE: 17.35   CESAREAN SECTION     COLONOSCOPY N/A 09/17/2013   Procedure: COLONOSCOPY;  Surgeon: Malissa Hippo, MD;  Location: [facility];  Service: Endoscopy;  Laterality: N/A;  1200   COLONOSCOPY N/A 09/16/2017   Procedure: COLONOSCOPY;  Surgeon: Malissa Hippo, MD;  Location: [facility];  Service: Endoscopy;  Laterality: N/A;  8:55   HYSTEROSCOPY WITH D & C N/A 08/25/2019   Procedure: DILATATION AND CURETTAGE /HYSTEROSCOPY;  Surgeon: Tilda Burrow, MD;  Location: [facility];  Service: Gynecology;  Laterality: N/A;   POLYPECTOMY N/A 08/25/2019   Procedure: ENDOMETRIAL POLYPECTOMY;  Surgeon: Tilda Burrow, MD;  Location: [facility];  Service:  Gynecology;  Laterality: N/A;   REVERSE SHOULDER ARTHROPLASTY Right 03/11/2014   Procedure: RIGHT REVERSE SHOULDER ARTHROPLASTY;  Surgeon: Senaida Lange, MD;  Location: [facility];  Service: Orthopedics;  Laterality: Right;   SHOULDER SURGERY Right    x4   TUBAL LIGATION     Family History  Problem Relation Age of Onset   Hypertension Father    Diabetes type II Father    Cancer Father    Hypertension Mother    COPD Mother    Hypertension Son    Social History   Socioeconomic History   Marital status: Married    Spouse name: Not on file   Number of children: Not on file   Years of education: Not on file   Highest education level: 12th grade  Occupational History   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  Tobacco  Use   Smoking status: Never   Smokeless tobacco: Never  Vaping Use   Vaping status: Never Used  Substance and Sexual Activity   Alcohol use: No   Drug use: No   Sexual activity: Not Currently    Birth control/protection: Post-menopausal  Other Topics Concern   Not on file  Social History Narrative   Not on file   Social Drivers of Health   Financial Resource Strain: Low Risk  (06/21/2023)   Overall Financial Resource Strain (CARDIA)    Difficulty of Paying Living Expenses: Not hard at all  Food Insecurity: No Food Insecurity (06/21/2023)   Hunger Vital Sign    Worried About Running Out of Food in the Last Year: Never true    Ran Out of Food in the Last Year: Never true  Transportation Needs: No Transportation Needs (06/21/2023)   PRAPARE - Administrator, Civil Service (Medical): No    Lack of Transportation (Non-Medical): No  Physical Activity: Inactive (06/21/2023)   Exercise Vital Sign    Days of Exercise per Week: 0 days    Minutes of Exercise per Session: 0 min  Stress: Stress Concern Present (06/21/2023)   [employer] of Occupational Health - Occupational Stress Questionnaire    Feeling of Stress : To some extent  Social  Connections: Moderately Integrated (06/21/2023)   Social Connection and Isolation Panel [NHANES]    Frequency of Communication with Friends and Family: Twice a week    Frequency of Social Gatherings with Friends and Family: Never    Attends Religious Services: More than 4 times per year    Active Member of [employer] or Organizations: Yes    Attends Engineer, structural: More than 4 times per year    Marital Status: Married    Tobacco Counseling Counseling given: Yes   Clinical Intake:  Pre-visit preparation completed: Yes  Pain : No/denies pain     BMI - recorded: 28.34 Nutritional Status: BMI 25 -29 Overweight Nutritional Risks: None Diabetes: No  How often do you need to have someone help you when you read instructions, pamphlets, or other written materials from your doctor or pharmacy?: 1 - Never  Interpreter Needed?: No  Information entered by :: Maryjean Ka CMA   Activities of Daily Living    06/21/2023    3:31 PM  In your present state of health, do you have any difficulty performing the following activities:  Hearing? 0  Vision? 0  Difficulty concentrating or making decisions? 0  Walking or climbing stairs? 0  Dressing or bathing? 0  Doing errands, shopping? 0  Preparing Food and eating ? N  Using the Toilet? N  In the past six months, have you accidently leaked urine? N  Do you have problems with loss of bowel control? N  Managing your Medications? N  Managing your Finances? N  Housekeeping or managing your Housekeeping? N    Patient Care Team: Tommie Sams, DO as PCP - General (Family Medicine) Daisy Lazar, DO (Optometry) Nita Sells, MD (Dermatology)  Indicate any recent Medical Services you may have received from other than [facility] providers in the past year (date may be approximate).     Assessment:   This is a routine wellness examination for Breanna Mason.  Hearing/Vision screen Hearing Screening - Comments:: Patient denies any hearing  difficulties.   Vision Screening - Comments:: Wears rx glasses - up to date with routine eye exams  Patient sees Dr. Daisy Lazar w/ [facility] [place] [facility].     Goals Addressed             This Visit's Progress    Patient Stated       Stay healthy       Depression Screen    06/21/2023    3:32 PM 08/31/2022    9:29 AM 06/15/2022    3:24 PM 08/31/2021   10:04 AM 08/31/2021   10:01 AM 05/30/2021    3:36 PM 03/02/2021    2:59 PM  PHQ 2/9 Scores  PHQ - 2 Score 0 2 0 0 0 0 0  PHQ- 9 Score  5         Fall Risk    06/21/2023    3:31 PM 06/15/2022    2:59 PM 03/01/2022   10:21 AM 08/31/2021   10:04 AM 08/31/2021   10:00 AM  Fall Risk   Falls in the past year? 0 0 0 0 0  Number falls in past yr: 0 0  0 0  Injury with Fall? 0 0  0 0  Risk for fall due to : No Fall Risks   No Fall Risks No Fall Risks  Follow up Falls prevention discussed;Falls evaluation completed Falls prevention discussed;Education provided;Falls evaluation completed  Falls evaluation completed Falls evaluation completed    MEDICARE RISK AT HOME: Medicare Risk at Home Any stairs in or around the home?: Yes If so, are there any without handrails?: No Home free of loose throw rugs in walkways, pet beds, electrical cords, etc?: Yes Adequate lighting in your home to reduce risk of falls?: Yes Life alert?: No Use of a cane, walker or w/c?: No Grab bars in the bathroom?: Yes Shower chair or bench in shower?: Yes Elevated toilet seat or a handicapped toilet?: Yes  TIMED UP AND GO:  Was the test performed?  No    Cognitive Function:        06/21/2023    3:30 PM 06/15/2022    3:01 PM 05/30/2021    3:40 PM  6CIT Screen  What Year? 0 points 0 points 0 points  What month? 0 points 0 points 0 points  What time? 0 points 0 points 0 points  Count back from 20 0 points 0 points 0 points  Months in reverse 0 points 0 points 0 points  Repeat phrase 0 points 0 points 0 points  Total Score 0 points 0 points  0 points    Immunizations Immunization History  Administered Date(s) Administered   Fluad Quad(high Dose 65+) 02/29/2020, 03/01/2022   Fluad Trivalent(High Dose 65+) 03/07/2023   Influenza,inj,Quad PF,6+ Mos 02/23/2014, 01/29/2017, 03/10/2018, 02/10/2019   Influenza-Unspecified 03/07/2012, 02/13/2013, 02/09/2015, 01/23/2016, 02/03/2021   Moderna Sars-Covid-2 Vaccination 07/10/2019, 08/11/2019   Pneumococcal Conjugate-13 09/04/2013   Pneumococcal Polysaccharide-23 01/05/2010, 03/06/2016   Td 09/04/2013   Zoster Recombinant(Shingrix) 11/22/2022   Zoster, Live 01/15/2008   Zoster, Unspecified 11/22/2022    TDAP status: Up to date  Flu Vaccine status: Up to date  Pneumococcal vaccine status: Up to date  Covid-19 vaccine status: Completed vaccines  Qualifies for Shingles Vaccine? Yes   Zostavax completed Yes   Shingrix Completed?: No.    Education has been provided regarding the importance of this vaccine. Patient has been advised to call insurance company to determine out of pocket expense if they have not yet received this vaccine. Advised may also receive vaccine at local pharmacy or Health Dept. Verbalized acceptance and understanding.Patient due for 2nd dose  Screening Tests  Health Maintenance  Topic Date Due   Zoster Vaccines- Shingrix (2 of 2) 01/17/2023   DEXA SCAN  04/07/2023   Medicare Annual Wellness (AWV)  06/16/2023   DTaP/Tdap/Td (2 - Tdap) 09/05/2023   MAMMOGRAM  03/24/2024   Colonoscopy  09/16/2024   Pneumonia Vaccine 38+ Years old  Completed   INFLUENZA VACCINE  Completed   HPV VACCINES  Aged Out   COVID-19 Vaccine  Discontinued   Hepatitis C Screening  Discontinued    Health Maintenance  Health Maintenance Due  Topic Date Due   Zoster Vaccines- Shingrix (2 of 2) 01/17/2023   DEXA SCAN  04/07/2023   Medicare Annual Wellness (AWV)  06/16/2023    Colorectal cancer screening: Type of screening: Colonoscopy. Completed 09/16/2017. Repeat every 7  years  Mammogram status: Completed 03/25/2023. Repeat every year  Bone Density status: Ordered 06/21/2023. Pt provided with contact info and advised to call to schedule appt.  Lung Cancer Screening: (Low Dose CT Chest recommended if Age 58-80 years, 20 pack-year currently smoking OR have quit w/in 15years.) does not qualify.   Lung Cancer Screening Referral: na  Additional Screening:  Hepatitis C Screening: does not qualify; Completed   Vision Screening: Recommended annual ophthalmology exams for early detection of glaucoma and other disorders of the eye. Is the patient up to date with their annual eye exam?  Yes  Who is the provider or what is the name of the office in which the patient attends annual eye exams? Daisy Lazar  Dental Screening: Recommended annual dental exams for proper oral hygiene  Diabetic Foot Exam: na  Community Resource Referral / Chronic Care Management: CRR required this visit?  No   CCM required this visit?  No     Plan:     I have personally reviewed and noted the following in the patient's chart:   Medical and social history Use of alcohol, tobacco or illicit drugs  Current medications and supplements including opioid prescriptions. Patient is not currently taking opioid prescriptions. Functional ability and status Nutritional status Physical activity Advanced directives List of other physicians Hospitalizations, surgeries, and ER visits in previous 12 months Vitals Screenings to include cognitive, depression, and falls Referrals and appointments  In addition, I have reviewed and discussed with patient certain preventive protocols, quality metrics, and best practice recommendations. A written personalized care plan for preventive services as well as general preventive health recommendations were provided to patient.     Jordan Hawks Nasario Czerniak, CMA   06/21/2023   After Visit Summary: (MyChart) Due to this being a telephonic visit, the after visit  summary with patients personalized plan was offered to patient via MyChart   Nurse Notes: na

## 2023-07-10 ENCOUNTER — Telehealth: Payer: Self-pay | Admitting: *Deleted

## 2023-07-10 ENCOUNTER — Other Ambulatory Visit: Payer: Self-pay | Admitting: Family Medicine

## 2023-07-10 NOTE — Telephone Encounter (Signed)
 Copied from CRM [id number]. Topic: Clinical - Prescription Issue >> Jul 10, 2023  4:17 PM Breanna Mason wrote: Reason for CRM: Patient called in says [employer] called says the prescriptions will no longer be covered  Fluticasone Furoate (ARNUITY ELLIPTA) 200 MCG/ACT AEPB Or budesonformot last refilled on 06/11/2023 will no longer covered and have to choose from the listed below wrisela 252 inhaler  Flueicafone 250 Broellibe 100/ 225

## 2023-07-11 ENCOUNTER — Encounter: Payer: Self-pay | Admitting: Adult Health

## 2023-07-11 ENCOUNTER — Ambulatory Visit: Payer: 59 | Admitting: Adult Health

## 2023-07-11 VITALS — BP 172/83 | HR 90 | Ht 63.0 in | Wt 166.0 lb

## 2023-07-11 DIAGNOSIS — Z0000 Encounter for general adult medical examination without abnormal findings: Secondary | ICD-10-CM

## 2023-07-11 DIAGNOSIS — Z1211 Encounter for screening for malignant neoplasm of colon: Secondary | ICD-10-CM

## 2023-07-11 DIAGNOSIS — Z1331 Encounter for screening for depression: Secondary | ICD-10-CM

## 2023-07-11 DIAGNOSIS — L900 Lichen sclerosus et atrophicus: Secondary | ICD-10-CM | POA: Diagnosis not present

## 2023-07-11 DIAGNOSIS — I10 Essential (primary) hypertension: Secondary | ICD-10-CM | POA: Diagnosis not present

## 2023-07-11 DIAGNOSIS — Z01419 Encounter for gynecological examination (general) (routine) without abnormal findings: Secondary | ICD-10-CM | POA: Insufficient documentation

## 2023-07-11 LAB — HEMOCCULT GUIAC POC 1CARD (OFFICE): Fecal Occult Blood, POC: NEGATIVE

## 2023-07-11 NOTE — Progress Notes (Signed)
 Patient ID: Breanna Mason, female   DOB: 1946/04/13, 78 y.o.   MRN: [id number] History of Present Illness: Breanna Mason is a 78 year old white female, married, PM in for well woman gyn exam. She had a kidney stone about 4 months ago. She keeps 74 year old twin grandchildren.   PCP is Dr Adriana Simas   Current Medications, Allergies, Past Medical History, Past Surgical History, Family History and Social History were reviewed in [employer] electronic medical record.     Review of Systems: Patient denies any headaches, hearing loss, blurred vision, shortness of breath, chest pain, abdominal pain, problems with bowel movements, urination, or intercourse.(Not active). No joint pain or mood swings.  Tired at times Feels a little raw when wipes   Physical Exam:BP (!) 172/83 (BP Location: Right Arm, Patient Position: Sitting, Cuff Size: Normal)   Pulse 90   Ht 5\' 3"  (1.6 m)   Wt 166 lb (75.3 kg)   BMI 29.41 kg/m   General:  Well developed, well nourished, no acute distress Skin:  Warm and dry Neck:  Midline trachea, normal thyroid, good ROM, no lymphadenopathy,no carotid bruits heard  Lungs; Clear to auscultation bilaterally Breast:  No dominant palpable mass, retraction, or nipple discharge Cardiovascular: Regular rate and rhythm Abdomen:  Soft, non tender, no hepatosplenomegaly Pelvic:  External genitalia is normal in appearance, no lesions.  The vagina is pale and atrophic has thin skin at introitus and fusion of labia near clitoris.  Urethra has no lesions or masses. The cervix is smooth.  Uterus is felt to be normal size, shape, and contour.  No adnexal masses or tenderness noted.Bladder is non tender, no masses felt. Rectal: Good sphincter tone, no polyps, + hemorrhoids felt.  Hemoccult negative. Extremities/musculoskeletal:  No swelling or varicosities noted, no clubbing or cyanosis Psych:  No mood changes, alert and cooperative,seems happy AA Is 0 Fall risk is low    07/11/2023    9:48  AM 06/21/2023    3:32 PM 08/31/2022    9:29 AM  Depression screen PHQ 2/9  Decreased Interest 2 0 1  Down, Depressed, Hopeless 2 0 1  PHQ - 2 Score 4 0 2  Altered sleeping 2  1  Tired, decreased energy 3  1  Change in appetite 1  0  Feeling bad or failure about yourself  0  0  Trouble concentrating 1  1  Moving slowly or fidgety/restless 0  0  Suicidal thoughts 0  0  PHQ-9 Score 11  5  Difficult doing work/chores   Not difficult at all   Has xanax prn     07/11/2023    9:48 AM 08/31/2022    9:30 AM 02/29/2020    3:13 PM 08/24/2019   10:06 AM  GAD 7 : Generalized Anxiety Score  Nervous, Anxious, on Edge 1 0 0 0  Control/stop worrying 2 1 0 2  Worry too much - different things 2 1 1 2   Trouble relaxing 3 1 1 1   Restless 1 0 0 0  Easily annoyed or irritable 1 0 0 0  Afraid - awful might happen 1 0 1 0  Total GAD 7 Score 11 3 3 5   Anxiety Difficulty  Not difficult at all  Not difficult at all      Upstream - 07/11/23 0953       Pregnancy Intention Screening   Does the patient want to become pregnant in the next year? N/A    Does the patient's partner  want to become pregnant in the next year? N/A    Would the patient like to discuss contraceptive options today? N/A      Contraception Wrap Up   Current Method Female Sterilization   PM   End Method Female Sterilization   PM   Contraception Counseling Provided No            Examination chaperoned by Malachy Mood LPN  Impression and plan: 1. Encounter for well woman exam with routine gynecological exam (Primary) Physical with PCP  Labs with PCP Mammogram was negative 03/25/23 Stay active  2. Essential hypertension, benign Follow up with PCP  3. Encounter for screening fecal occult blood testing Hemoccult was negative  - POCT occult blood stool  4. Lichen sclerosus et atrophicus She declines temovate for now She is using new soap  She will call me if wants the temovate sent in  Follow up prn

## 2023-07-12 ENCOUNTER — Other Ambulatory Visit: Payer: Self-pay | Admitting: Family Medicine

## 2023-07-12 MED ORDER — FLUTICASONE-SALMETEROL 250-50 MCG/ACT IN AEPB: 1.0000 | INHALATION_SPRAY | Freq: Two times a day (BID) | RESPIRATORY_TRACT | 6 refills | Status: AC

## 2023-07-15 NOTE — Telephone Encounter (Signed)
 Tommie Sams, DO     Rx sent.

## 2023-07-25 ENCOUNTER — Encounter (HOSPITAL_COMMUNITY): Payer: Self-pay | Admitting: Nurse Practitioner

## 2023-08-08 ENCOUNTER — Encounter: Payer: Self-pay | Admitting: Nurse Practitioner

## 2023-08-08 ENCOUNTER — Ambulatory Visit (HOSPITAL_COMMUNITY)
Admission: RE | Admit: 2023-08-08 | Discharge: 2023-08-08 | Disposition: A | Discharge status: Home / Self Care | Source: Ambulatory Visit | Attending: Nurse Practitioner | Admitting: Nurse Practitioner

## 2023-08-08 DIAGNOSIS — Z780 Asymptomatic menopausal state: Secondary | ICD-10-CM | POA: Insufficient documentation

## 2023-08-08 DIAGNOSIS — Z0000 Encounter for general adult medical examination without abnormal findings: Secondary | ICD-10-CM | POA: Diagnosis not present

## 2023-08-08 DIAGNOSIS — Z79899 Other long term (current) drug therapy: Secondary | ICD-10-CM | POA: Diagnosis not present

## 2023-08-08 DIAGNOSIS — R7303 Prediabetes: Secondary | ICD-10-CM | POA: Diagnosis not present

## 2023-08-08 DIAGNOSIS — E782 Mixed hyperlipidemia: Secondary | ICD-10-CM | POA: Diagnosis not present

## 2023-08-08 DIAGNOSIS — M85832 Other specified disorders of bone density and structure, left forearm: Secondary | ICD-10-CM | POA: Diagnosis not present

## 2023-08-08 DIAGNOSIS — I10 Essential (primary) hypertension: Secondary | ICD-10-CM | POA: Diagnosis not present

## 2023-08-09 LAB — LIPID PANEL
Chol/HDL Ratio: 3.8 ratio (ref 0.0–4.4)
Cholesterol, Total: 215 mg/dL — ABNORMAL HIGH (ref 100–199)
HDL: 56 mg/dL (ref >39)
LDL Chol Calc (NIH): 144 mg/dL — ABNORMAL HIGH (ref 0–99)
Triglycerides: 84 mg/dL (ref 0–149)
VLDL Cholesterol Cal: 15 mg/dL (ref 5–40)

## 2023-08-09 LAB — BASIC METABOLIC PANEL WITH GFR
BUN/Creatinine Ratio: 21 (ref 12–28)
BUN: 15 mg/dL (ref 8–27)
CO2: 21 mmol/L (ref 20–29)
Calcium: 9.1 mg/dL (ref 8.7–10.3)
Chloride: 104 mmol/L (ref 96–106)
Creatinine, Ser: 0.71 mg/dL (ref 0.57–1.00)
Glucose: 104 mg/dL — ABNORMAL HIGH (ref 70–99)
Potassium: 4.6 mmol/L (ref 3.5–5.2)
Sodium: 140 mmol/L (ref 134–144)
eGFR: 88 mL/min/{1.73_m2} (ref >59)

## 2023-08-09 LAB — HEPATIC FUNCTION PANEL
ALT: 41 IU/L — ABNORMAL HIGH (ref 0–32)
AST: 27 IU/L (ref 0–40)
Albumin: 4.4 g/dL (ref 3.8–4.8)
Alkaline Phosphatase: 80 IU/L (ref 44–121)
Bilirubin Total: 0.4 mg/dL (ref 0.0–1.2)
Bilirubin, Direct: 0.14 mg/dL (ref 0.00–0.40)
Total Protein: 6.9 g/dL (ref 6.0–8.5)

## 2023-08-09 LAB — MICROALBUMIN / CREATININE URINE RATIO
Creatinine, Urine: 150 mg/dL
Microalb/Creat Ratio: 18 mg/g{creat} (ref 0–29)
Microalbumin, Urine: 27.4 ug/mL

## 2023-08-09 LAB — HEMOGLOBIN A1C
Est. average glucose Bld gHb Est-mCnc: 128 mg/dL
Hgb A1c MFr Bld: 6.1 % — ABNORMAL HIGH (ref 4.8–5.6)

## 2023-08-11 ENCOUNTER — Encounter: Payer: Self-pay | Admitting: Family Medicine

## 2023-08-12 ENCOUNTER — Ambulatory Visit: Payer: Self-pay

## 2023-08-12 DIAGNOSIS — Z1379 Encounter for other screening for genetic and chromosomal anomalies: Secondary | ICD-10-CM | POA: Diagnosis not present

## 2023-08-12 NOTE — Telephone Encounter (Signed)
 Patient scheduled 08/13/23 with Dr Adriana Simas

## 2023-08-12 NOTE — Telephone Encounter (Signed)
  Chief Complaint: blood in urine Symptoms: hematuria, urgency and frequency Frequency: this afternoon Pertinent Negatives: Patient denies pain  Disposition: [] ED /[] Urgent Care (no appt availability in office) / [x] Appointment(In office/virtual)/ []  [facility]/ [] Home Care/ [] Refused Recommended Disposition /[] [facility]/ []  Follow-up with PCP Additional Notes: pt states that she noticed blood in her urine this afternoon. States she is having to wear a pad due to frequency and urgency and noticed her pad was tinted pink.  Denies fever or pain. States she was seen in the ED for this about a month ago but it went away.    Copied from CRM [id number]. Topic: Clinical - Red Word Triage >> Aug 12, 2023  2:05 PM Carlatta H wrote: [facility] that prompted transfer to Nurse Triage: Patient has blood in urine and urinating frequently// Reason for Disposition  Blood in urine  (Exception: Could be normal menstrual bleeding.)  Answer Assessment - Initial Assessment Questions 1. COLOR of URINE: "Describe the color of the urine."  (e.g., tea-colored, pink, red, bloody) "Do you have blood clots in your urine?" (e.g., none, pea, grape, small coin)     Tinting her pas pink 2. ONSET: "When did the bleeding start?"      today 3. EPISODES: "How many times has there been blood in the urine?" or "How many times today?"     x2 4. PAIN with URINATION: "Is there any pain with passing your urine?" If Yes, ask: "How bad is the pain?"  (Scale 1-10; or mild, moderate, severe)    - MILD: Complains slightly about urination hurting.    - MODERATE: Interferes with normal activities.      - SEVERE: Excruciating, unwilling or unable to urinate because of the pain.      No pain 5. FEVER: "Do you have a fever?" If Yes, ask: "What is your temperature, how was it measured, and when did it start?"     No fever 6. ASSOCIATED SYMPTOMS: "Are you passing urine more frequently than usual?"     Frequency and  urgency 7. OTHER SYMPTOMS: "Do you have any other symptoms?" (e.g., back/flank pain, abdomen pain, vomiting)     no  Protocols used: Urine - Blood In-A-AH

## 2023-08-13 ENCOUNTER — Ambulatory Visit (INDEPENDENT_AMBULATORY_CARE_PROVIDER_SITE_OTHER): Admitting: Family Medicine

## 2023-08-13 ENCOUNTER — Encounter: Payer: Self-pay | Admitting: Family Medicine

## 2023-08-13 VITALS — BP 142/76 | HR 94 | Temp 98.4°F | Ht 63.0 in | Wt 163.0 lb

## 2023-08-13 DIAGNOSIS — N3001 Acute cystitis with hematuria: Secondary | ICD-10-CM

## 2023-08-13 DIAGNOSIS — R399 Unspecified symptoms and signs involving the genitourinary system: Secondary | ICD-10-CM

## 2023-08-13 LAB — POCT URINALYSIS DIP (CLINITEK)
Bilirubin, UA: NEGATIVE
Glucose, UA: NEGATIVE mg/dL
Ketones, POC UA: NEGATIVE mg/dL
Nitrite, UA: POSITIVE — AB
POC PROTEIN,UA: 100 — AB
Spec Grav, UA: 1.02 (ref 1.010–1.025)
Urobilinogen, UA: 0.2 U/dL
pH, UA: 6 (ref 5.0–8.0)

## 2023-08-13 MED ORDER — ATORVASTATIN CALCIUM 20 MG PO TABS: 20.0000 mg | ORAL_TABLET | Freq: Every day | ORAL | 3 refills | Status: AC

## 2023-08-13 MED ORDER — NITROFURANTOIN MONOHYD MACRO 100 MG PO CAPS: 100.0000 mg | ORAL_CAPSULE | Freq: Two times a day (BID) | ORAL | 0 refills | Status: AC

## 2023-08-14 DIAGNOSIS — N390 Urinary tract infection, site not specified: Secondary | ICD-10-CM | POA: Insufficient documentation

## 2023-08-14 NOTE — Assessment & Plan Note (Signed)
Treating with Macrobid. Sending culture.

## 2023-08-14 NOTE — Progress Notes (Signed)
 Subjective:  Patient ID: Breanna Mason, female    DOB: 26-May-1945  Age: 78 y.o. MRN: [id number]  CC:   Chief Complaint  Patient presents with   Hematuria    Hematuria x's 3 days, urinary incontinence, urinary frequency     HPI:  78 year old female presents for evaluation of the above.  Patient reports that her symptoms started yesterday.  She reports urinary frequency, urinary incontinence, and hematuria.  No abdominal pain.  No current flank pain.  No fever.  No relieving factors.  No other complaints.  Patient Active Problem List   Diagnosis Date Noted   UTI (urinary tract infection) 08/14/2023   Lichen sclerosus et atrophicus 07/11/2023   Hematuria 03/07/2023   Anxiety 03/03/2021   Osteoporosis without current pathological fracture 08/29/2020   Prediabetes 09/08/2017   S/P shoulder replacement 03/11/2014   Essential hypertension, benign 03/06/2011   Mixed hyperlipidemia 03/06/2011   Asthma 03/06/2011    Social Hx   Social History   Socioeconomic History   Marital status: Married    Spouse name: Not on file   Number of children: Not on file   Years of education: Not on file   Highest education level: 12th grade  Occupational History   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  Tobacco Use   Smoking status: Never   Smokeless tobacco: Never  Vaping Use   Vaping status: Never Used  Substance and Sexual Activity   Alcohol use: No   Drug use: No   Sexual activity: Not Currently    Birth control/protection: Post-menopausal, Surgical    Comment: tubal  Other Topics Concern   Not on file  Social History Narrative   Not on file   Social Drivers of Health   Financial Resource Strain: Low Risk  (07/11/2023)   Overall Financial Resource Strain (CARDIA)    Difficulty of Paying Living Expenses: Not hard at all  Food Insecurity: No Food Insecurity (07/11/2023)   Hunger Vital Sign    Worried About Running Out of Food in the Last Year: Never true    Ran Out  of Food in the Last Year: Never true  Transportation Needs: No Transportation Needs (07/11/2023)   PRAPARE - Administrator, Civil Service (Medical): No    Lack of Transportation (Non-Medical): No  Physical Activity: Inactive (07/11/2023)   Exercise Vital Sign    Days of Exercise per Week: 0 days    Minutes of Exercise per Session: 0 min  Stress: No Stress Concern Present (07/11/2023)   [employer] of Occupational Health - Occupational Stress Questionnaire    Feeling of Stress : Only a little  Recent Concern: Stress - Stress Concern Present (06/21/2023)   [employer] of Occupational Health - Occupational Stress Questionnaire    Feeling of Stress : To some extent  Social Connections: Socially Integrated (07/11/2023)   Social Connection and Isolation Panel [NHANES]    Frequency of Communication with Friends and Family: Twice a week    Frequency of Social Gatherings with Friends and Family: Once a week    Attends Religious Services: More than 4 times per year    Active Member of [employer] or Organizations: Yes    Attends Engineer, structural: More than 4 times per year    Marital Status: Married    Review of Systems Per HPI  Objective:  BP (!) 142/76   Pulse 94   Temp 98.4 F (36.9 C)   Ht 5\' 3"  (1.6  m)   Wt 163 lb (73.9 kg)   SpO2 98%   BMI 28.87 kg/m      08/13/2023    2:43 PM 07/11/2023   10:31 AM 07/11/2023    9:48 AM  BP/Weight  Systolic BP 142 172 142  Diastolic BP 76 83 77  Wt. (Lbs) 163  166  BMI 28.87 kg/m2  29.41 kg/m2    Physical Exam Vitals and nursing note reviewed.  Constitutional:      Appearance: Normal appearance.  HENT:     Head: Normocephalic and atraumatic.  Cardiovascular:     Rate and Rhythm: Normal rate and regular rhythm.  Pulmonary:     Effort: Pulmonary effort is normal.     Breath sounds: Normal breath sounds.  Abdominal:     General: There is no distension.     Palpations: Abdomen is soft.     Tenderness: There is  no abdominal tenderness.  Neurological:     Mental Status: She is alert.     Lab Results  Component Value Date   WBC 4.4 03/01/2023   HGB 15.2 03/01/2023   HCT 45.3 03/01/2023   PLT 237 03/01/2023   GLUCOSE 104 (H) 08/08/2023   CHOL 215 (H) 08/08/2023   TRIG 84 08/08/2023   HDL 56 08/08/2023   LDLCALC 144 (H) 08/08/2023   ALT 41 (H) 08/08/2023   AST 27 08/08/2023   NA 140 08/08/2023   K 4.6 08/08/2023   CL 104 08/08/2023   CREATININE 0.71 08/08/2023   BUN 15 08/08/2023   CO2 21 08/08/2023   TSH 2.320 02/11/2019   HGBA1C 6.1 (H) 08/08/2023     Assessment & Plan:  Acute cystitis with hematuria Assessment & Plan: Treating with Macrobid.  Sending culture.  Orders: -     POCT URINALYSIS DIP (CLINITEK) -     Urine Culture  Other orders -     Atorvastatin Calcium; Take 1 tablet (20 mg total) by mouth daily.  Dispense: 90 tablet; Refill: 3 -     Nitrofurantoin Monohyd Macro; Take 1 capsule (100 mg total) by mouth 2 (two) times daily.  Dispense: 14 capsule; Refill: 0    Follow-up:  Return if symptoms worsen or fail to improve.  Everlene Other DO [facility]

## 2023-08-16 ENCOUNTER — Ambulatory Visit: Payer: 59 | Admitting: Family Medicine

## 2023-08-16 LAB — URINE CULTURE

## 2023-08-16 LAB — SPECIMEN STATUS REPORT

## 2023-08-17 ENCOUNTER — Encounter: Payer: Self-pay | Admitting: Family Medicine

## 2023-09-27 ENCOUNTER — Other Ambulatory Visit: Payer: Self-pay | Admitting: Family Medicine

## 2023-10-10 DIAGNOSIS — D485 Neoplasm of uncertain behavior of skin: Secondary | ICD-10-CM | POA: Diagnosis not present

## 2023-10-10 DIAGNOSIS — L821 Other seborrheic keratosis: Secondary | ICD-10-CM | POA: Diagnosis not present

## 2023-10-10 DIAGNOSIS — D2261 Melanocytic nevi of right upper limb, including shoulder: Secondary | ICD-10-CM | POA: Diagnosis not present

## 2023-10-10 DIAGNOSIS — Z1283 Encounter for screening for malignant neoplasm of skin: Secondary | ICD-10-CM | POA: Diagnosis not present

## 2023-10-10 DIAGNOSIS — D225 Melanocytic nevi of trunk: Secondary | ICD-10-CM | POA: Diagnosis not present

## 2023-10-24 ENCOUNTER — Telehealth: Payer: Self-pay | Admitting: *Deleted

## 2023-10-24 NOTE — Telephone Encounter (Unsigned)
 Copied from CRM [id number]. Topic: Referral - Request for Referral >> Oct 23, 2023 10:43 AM Tiffini S wrote: Did the patient discuss referral with their provider in the last year? No (If No - schedule appointment) (If Yes - send message)  Appointment offered? Yes  Type of order/referral and detailed reason for visit: [facility] for left shoulder for soreness Preference of office, provider, location:   If referral order, have you been seen by this specialty before? No, the soreness just started and patient is asking to see specialist  (If Yes, this issue or another issue? When? Where?  Can we respond through MyChart? No

## 2023-10-27 ENCOUNTER — Other Ambulatory Visit: Payer: Self-pay | Admitting: Family Medicine

## 2023-10-28 NOTE — Telephone Encounter (Signed)
 Cook, Jayce G, DO      10/24/23 10:11 PM Needs to see me first as they need records/documentation of the problem.

## 2023-11-04 DIAGNOSIS — M25512 Pain in left shoulder: Secondary | ICD-10-CM | POA: Diagnosis not present

## 2023-11-04 DIAGNOSIS — M25511 Pain in right shoulder: Secondary | ICD-10-CM | POA: Diagnosis not present

## 2023-11-06 ENCOUNTER — Other Ambulatory Visit: Payer: Self-pay | Admitting: Family Medicine

## 2023-11-11 DIAGNOSIS — G35 Multiple sclerosis: Secondary | ICD-10-CM | POA: Diagnosis not present

## 2023-11-30 DIAGNOSIS — R700 Elevated erythrocyte sedimentation rate: Secondary | ICD-10-CM | POA: Diagnosis not present

## 2023-12-23 ENCOUNTER — Other Ambulatory Visit: Payer: Self-pay | Admitting: Family Medicine

## 2023-12-23 DIAGNOSIS — F419 Anxiety disorder, unspecified: Secondary | ICD-10-CM

## 2024-01-14 ENCOUNTER — Ambulatory Visit (INDEPENDENT_AMBULATORY_CARE_PROVIDER_SITE_OTHER): Admitting: Family Medicine

## 2024-01-14 ENCOUNTER — Encounter: Payer: Self-pay | Admitting: Family Medicine

## 2024-01-14 VITALS — BP 131/81 | HR 80 | Ht 63.0 in | Wt 164.0 lb

## 2024-01-14 DIAGNOSIS — Z23 Encounter for immunization: Secondary | ICD-10-CM | POA: Diagnosis not present

## 2024-01-14 DIAGNOSIS — M25512 Pain in left shoulder: Secondary | ICD-10-CM

## 2024-01-14 MED ORDER — PREDNISONE 10 MG PO TABS: ORAL_TABLET | ORAL | 0 refills | Status: AC

## 2024-01-14 NOTE — Patient Instructions (Addendum)
Medication as prescribed.    If persists, please let me know.  Take care  Dr. Kaycie Pegues  

## 2024-01-14 NOTE — Assessment & Plan Note (Signed)
 Most consistent with bursitis.  Trial of oral corticosteroids.  Discussed physical therapy.  She wants to wait at this time.

## 2024-01-14 NOTE — Progress Notes (Signed)
 Subjective:  Patient ID: Breanna Mason, female    DOB: 1945-08-15  Age: 78 y.o. MRN: [id number]  CC:   Chief Complaint  Patient presents with   Shoulder Pain    Left side, trouble sleeping, uses heating pad and arthritis medication and ibuprofen    HPI:  78 year old female presents for evaluation of the above.  Ongoing shoulder pain. Has been going on for the past few months. Was seen on 6/30 and had a steroid injection (ortho) without significant improvement. Pain is worse at night. Has difficulty sleeping as a result. No relieving factors.   Patient Active Problem List   Diagnosis Date Noted   Left shoulder pain 01/14/2024   Lichen sclerosus et atrophicus 07/11/2023   Anxiety 03/03/2021   Osteoporosis without current pathological fracture 08/29/2020   Prediabetes 09/08/2017   S/P shoulder replacement 03/11/2014   Essential hypertension, benign 03/06/2011   Mixed hyperlipidemia 03/06/2011   Asthma 03/06/2011    Social Hx   Social History   Socioeconomic History   Marital status: Married    Spouse name: Not on file   Number of children: Not on file   Years of education: Not on file   Highest education level: 12th grade  Occupational History   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  Tobacco Use   Smoking status: Never   Smokeless tobacco: Never  Vaping Use   Vaping status: Never Used  Substance and Sexual Activity   Alcohol use: No   Drug use: No   Sexual activity: Not Currently    Birth control/protection: Post-menopausal, Surgical    Comment: tubal  Other Topics Concern   Not on file  Social History Narrative   Not on file   Social Drivers of Health   Financial Resource Strain: Low Risk  (07/11/2023)   Overall Financial Resource Strain (CARDIA)    Difficulty of Paying Living Expenses: Not hard at all  Food Insecurity: No Food Insecurity (07/11/2023)   Hunger Vital Sign    Worried About Running Out of Food in the Last Year: Never true    Ran  Out of Food in the Last Year: Never true  Transportation Needs: No Transportation Needs (07/11/2023)   PRAPARE - Administrator, Civil Service (Medical): No    Lack of Transportation (Non-Medical): No  Physical Activity: Inactive (07/11/2023)   Exercise Vital Sign    Days of Exercise per Week: 0 days    Minutes of Exercise per Session: 0 min  Stress: No Stress Concern Present (07/11/2023)   [employer] of Occupational Health - Occupational Stress Questionnaire    Feeling of Stress : Only a little  Recent Concern: Stress - Stress Concern Present (06/21/2023)   [employer] of Occupational Health - Occupational Stress Questionnaire    Feeling of Stress : To some extent  Social Connections: Socially Integrated (07/11/2023)   Social Connection and Isolation Panel    Frequency of Communication with Friends and Family: Twice a week    Frequency of Social Gatherings with Friends and Family: Once a week    Attends Religious Services: More than 4 times per year    Active Member of [employer] or Organizations: Yes    Attends Engineer, structural: More than 4 times per year    Marital Status: Married    Review of Systems Per HPI  Objective:  BP 131/81   Pulse 80   Ht 5' 3 (1.6 m)   Wt  164 lb (74.4 kg)   SpO2 98%   BMI 29.05 kg/m      01/14/2024    9:53 AM 08/13/2023    2:43 PM 07/11/2023   10:31 AM  BP/Weight  Systolic BP 131 142 172  Diastolic BP 81 76 83  Wt. (Lbs) 164 163   BMI 29.05 kg/m2 28.87 kg/m2     Physical Exam Vitals and nursing note reviewed.  Constitutional:      General: She is not in acute distress.    Appearance: Normal appearance.  HENT:     Head: Normocephalic and atraumatic.  Musculoskeletal:     Comments: Shoulder: Left Inspection reveals no abnormalities, atrophy or asymmetry. Palpation is normal with no tenderness over AC joint or bicipital groove. ROM is full in all planes. Rotator cuff strength normal throughout. + Hawkin's  tests, empty can.     Neurological:     Mental Status: She is alert.     Lab Results  Component Value Date   WBC 4.4 03/01/2023   HGB 15.2 03/01/2023   HCT 45.3 03/01/2023   PLT 237 03/01/2023   GLUCOSE 104 (H) 08/08/2023   CHOL 215 (H) 08/08/2023   TRIG 84 08/08/2023   HDL 56 08/08/2023   LDLCALC 144 (H) 08/08/2023   ALT 41 (H) 08/08/2023   AST 27 08/08/2023   NA 140 08/08/2023   K 4.6 08/08/2023   CL 104 08/08/2023   CREATININE 0.71 08/08/2023   BUN 15 08/08/2023   CO2 21 08/08/2023   TSH 2.320 02/11/2019   HGBA1C 6.1 (H) 08/08/2023     Assessment & Plan:  Need for influenza vaccination -     Flu vaccine HIGH DOSE PF(Fluzone  Trivalent)  Acute pain of left shoulder Assessment & Plan: Most consistent with bursitis.  Trial of oral corticosteroids.  Discussed physical therapy.  She wants to wait at this time.   Other orders -     predniSONE ; 50 mg daily x 2 days, then 40 mg daily x 2 days, then 30 mg daily x 2 days, then 20 mg daily x 2 days, then 10 mg daily x 2 days.  Dispense: 30 tablet; Refill: 0    Follow-up:  Return if symptoms worsen or fail to improve.  Jacqulyn Ahle DO [facility]

## 2024-01-23 ENCOUNTER — Other Ambulatory Visit: Payer: Self-pay | Admitting: Family Medicine

## 2024-01-30 ENCOUNTER — Telehealth: Payer: Self-pay

## 2024-01-30 NOTE — Telephone Encounter (Signed)
 Copied from CRM #[id number]. Topic: Clinical - Request for Lab/Test Order >> Jan 30, 2024  3:56 PM Pinkey ORN wrote: Reason for CRM: Requesting Blood Work >> Jan 30, 2024  3:59 PM Pinkey ORN wrote: Patient states she is requesting her 6 month blood work. A1C, Cholesterol, Hemoglobin, Lipid panel.

## 2024-01-30 NOTE — Telephone Encounter (Signed)
 Copied from CRM [id number]. Topic: Clinical - Request for Lab/Test Order >> Jan 30, 2024  3:07 PM Breanna Mason wrote: Reason for CRM:  Patient is calling to results orders for lab work as a follow up. Please advise CB- [phone]

## 2024-01-31 ENCOUNTER — Telehealth: Payer: Self-pay

## 2024-01-31 DIAGNOSIS — Z79899 Other long term (current) drug therapy: Secondary | ICD-10-CM

## 2024-01-31 DIAGNOSIS — E782 Mixed hyperlipidemia: Secondary | ICD-10-CM

## 2024-01-31 DIAGNOSIS — I10 Essential (primary) hypertension: Secondary | ICD-10-CM

## 2024-01-31 DIAGNOSIS — R7303 Prediabetes: Secondary | ICD-10-CM

## 2024-01-31 NOTE — Telephone Encounter (Signed)
 Addressed in separate encounter.

## 2024-01-31 NOTE — Telephone Encounter (Signed)
 Copied from CRM #[id number]. Topic: Clinical - Request for Lab/Test Order >> Jan 30, 2024  3:56 PM Pinkey ORN wrote: Reason for CRM: Requesting Blood Work >> Jan 30, 2024  4:14 PM CMA Sheena P wrote: Communication routed to incorrect clinic. Re routed back to original sender.  >> Jan 30, 2024  3:59 PM Pinkey ORN wrote: Patient states she is requesting her 6 month blood work. A1C, Cholesterol, Hemoglobin, Lipid panel.

## 2024-02-02 ENCOUNTER — Other Ambulatory Visit: Payer: Self-pay | Admitting: Family Medicine

## 2024-02-03 NOTE — Telephone Encounter (Signed)
Lab orders placed.  Left message informing patient. 

## 2024-02-03 NOTE — Addendum Note (Signed)
 Addended by: Sipriano Fendley M on: 02/03/2024 02:44 PM   Modules accepted: Orders

## 2024-02-10 ENCOUNTER — Other Ambulatory Visit (HOSPITAL_COMMUNITY): Payer: Self-pay | Admitting: Family Medicine

## 2024-02-10 DIAGNOSIS — Z1231 Encounter for screening mammogram for malignant neoplasm of breast: Secondary | ICD-10-CM

## 2024-02-21 DIAGNOSIS — R7303 Prediabetes: Secondary | ICD-10-CM | POA: Diagnosis not present

## 2024-02-21 DIAGNOSIS — I10 Essential (primary) hypertension: Secondary | ICD-10-CM | POA: Diagnosis not present

## 2024-02-21 DIAGNOSIS — Z79899 Other long term (current) drug therapy: Secondary | ICD-10-CM | POA: Diagnosis not present

## 2024-02-21 DIAGNOSIS — E782 Mixed hyperlipidemia: Secondary | ICD-10-CM | POA: Diagnosis not present

## 2024-02-22 LAB — CMP14+EGFR: EGFR: 86

## 2024-03-03 ENCOUNTER — Telehealth: Payer: Self-pay

## 2024-03-03 NOTE — Telephone Encounter (Signed)
 Copied from CRM [id number]. Topic: Clinical - Lab/Test Results >> Mar 02, 2024 10:03 AM Logan F wrote: Reason for CRM: Pt says she had labs done on 10/19 and she does not see them on her My Chart and has not received her results. She says she only on [facility] site. Please advise

## 2024-03-10 NOTE — Telephone Encounter (Signed)
 Left a message for the patient to inform I have contacted [facility] they stated they only had a cmp from 10/17 and they will fax this over to the provider.

## 2024-03-11 ENCOUNTER — Telehealth: Payer: Self-pay

## 2024-03-11 NOTE — Telephone Encounter (Signed)
 Copied from CRM [id number]. Topic: Clinical - Lab/Test Results >> Mar 10, 2024  4:36 PM Jasmin G wrote: Reason for CRM: Pt called to check on the status on her request on 10/27, please refer to recent Encounters for more info. Call pt back at [phone].

## 2024-03-11 NOTE — Telephone Encounter (Signed)
 Cook, Jayce G, DO      03/11/24  4:15 PM I don't have any results.

## 2024-03-12 ENCOUNTER — Telehealth: Payer: Self-pay

## 2024-03-12 NOTE — Telephone Encounter (Signed)
 I've contacted [facility] and requested lab results be faxed over for review

## 2024-03-12 NOTE — Telephone Encounter (Signed)
 Cmp was received and reviewed by Ms Breanna Mason labs normal, however fasting glucose was 116 , keep eye on this, pt was informed she will get the rest of labs done at next appt. In feb

## 2024-03-16 NOTE — Telephone Encounter (Signed)
 See other message

## 2024-03-27 ENCOUNTER — Ambulatory Visit (HOSPITAL_COMMUNITY)
Admission: RE | Admit: 2024-03-27 | Discharge: 2024-03-27 | Disposition: A | Discharge status: Home / Self Care | Source: Ambulatory Visit | Attending: Family Medicine | Admitting: Family Medicine

## 2024-03-27 ENCOUNTER — Encounter (HOSPITAL_COMMUNITY): Payer: Self-pay

## 2024-03-27 DIAGNOSIS — Z1231 Encounter for screening mammogram for malignant neoplasm of breast: Secondary | ICD-10-CM | POA: Insufficient documentation

## 2024-04-15 ENCOUNTER — Ambulatory Visit: Admitting: Family Medicine

## 2024-04-15 ENCOUNTER — Ambulatory Visit: Payer: Self-pay

## 2024-04-15 VITALS — BP 138/85 | HR 52 | Temp 97.4°F | Ht 63.0 in | Wt 163.2 lb

## 2024-04-15 DIAGNOSIS — F419 Anxiety disorder, unspecified: Secondary | ICD-10-CM

## 2024-04-15 DIAGNOSIS — I10 Essential (primary) hypertension: Secondary | ICD-10-CM

## 2024-04-15 DIAGNOSIS — G8929 Other chronic pain: Secondary | ICD-10-CM

## 2024-04-15 MED ORDER — DICLOFENAC SODIUM 75 MG PO TBEC: 75.0000 mg | DELAYED_RELEASE_TABLET | Freq: Two times a day (BID) | ORAL | 1 refills | Status: AC | PRN

## 2024-04-15 MED ORDER — ALPRAZOLAM 0.5 MG PO TABS: ORAL_TABLET | ORAL | 0 refills | Status: AC

## 2024-04-15 NOTE — Telephone Encounter (Signed)
 FYI Only or Action Required?: FYI only for provider: appointment scheduled on 04/15/2024.  Patient was last seen in primary care on 01/14/2024 by Cook, Jayce G, DO.  Called Nurse Triage reporting Pain.  Symptoms began a week ago.  Interventions attempted: OTC medications: ibuprofen and Ice/heat application.  Symptoms are: gradually worsening.  Triage Disposition: See PCP Within 2 Weeks  Patient/caregiver understands and will follow disposition?: Yes  Copied from CRM [id number]. Topic: Clinical - Red Word Triage >> Apr 15, 2024 10:39 AM Montie POUR wrote: Red Word that prompted transfer to Nurse Triage:  Left shoulder pain on and off; hip pain; both knees are in pain. It is hard to get up and down. Pain level is a 5 on hip; right foot cannot put a lot of weight on it and pain level 9 to 10. Reason for Disposition  [1] MILD pain (e.g., does not interfere with normal activities) AND [2] present > 7 days  Answer Assessment - Initial Assessment Questions Uses Voltaren gel for knee pain with success, Denies foot pain. Shoulder pain for 3 months, was told its bursitis and was given cortisone shot and told to take ibuprofen, with some relief. Was given steroids but says they didn't work at all. Uses heat and ice sometimes. 1. ONSET: When did the muscle aches or body pains start?      04/10/2024 2. LOCATION: What part of your body is hurting? (e.g., entire body, arms, legs)      Left shoulder, Right hip pain, both knees but only at night. 3. SEVERITY: How bad is the pain? (Scale 1-10; or mild, moderate, severe)     Hip 4-5, Shoulder 6-7.  4. CAUSE: What do you think is causing the pains?     Bursitis 5. FEVER: Do you have a fever? If Yes, ask: What is your temperature, how was it measured, and  when did it start?      fever 6. OTHER SYMPTOMS: Do you have any other symptoms? (e.g., chest pain, cold or flu symptoms, rash, weakness, weight loss)     Denies  Protocols used: Muscle  Aches and Body Pain-A-AH

## 2024-04-15 NOTE — Patient Instructions (Signed)
 Medication as prescribed.  Xray at your convenience.  We will call with results.

## 2024-04-16 ENCOUNTER — Ambulatory Visit: Payer: Self-pay | Admitting: Family Medicine

## 2024-04-16 ENCOUNTER — Ambulatory Visit (HOSPITAL_COMMUNITY)
Admission: RE | Admit: 2024-04-16 | Discharge: 2024-04-16 | Disposition: A | Discharge status: Home / Self Care | Source: Ambulatory Visit | Attending: Family Medicine | Admitting: Family Medicine

## 2024-04-16 DIAGNOSIS — M47816 Spondylosis without myelopathy or radiculopathy, lumbar region: Secondary | ICD-10-CM | POA: Diagnosis not present

## 2024-04-16 DIAGNOSIS — M25512 Pain in left shoulder: Secondary | ICD-10-CM | POA: Insufficient documentation

## 2024-04-16 DIAGNOSIS — M5441 Lumbago with sciatica, right side: Secondary | ICD-10-CM | POA: Diagnosis not present

## 2024-04-16 DIAGNOSIS — G8929 Other chronic pain: Secondary | ICD-10-CM | POA: Insufficient documentation

## 2024-04-16 DIAGNOSIS — M19012 Primary osteoarthritis, left shoulder: Secondary | ICD-10-CM | POA: Diagnosis not present

## 2024-04-16 DIAGNOSIS — M5136 Other intervertebral disc degeneration, lumbar region with discogenic back pain only: Secondary | ICD-10-CM | POA: Diagnosis not present

## 2024-04-16 NOTE — Assessment & Plan Note (Signed)
 Stable

## 2024-04-16 NOTE — Assessment & Plan Note (Signed)
 X-ray for further relation.  Diclofenac as prescribed.

## 2024-04-16 NOTE — Progress Notes (Signed)
 Subjective:  Patient ID: Breanna Mason, female    DOB: 1945/07/19  Age: 78 y.o. MRN: [id number]  CC:   Chief Complaint  Patient presents with   Pain    Patient is here for experiencing hip/knee/shoulder pain. Patient stated she has a bursa on the left shoulder    HPI:  78 year old female presents for evaluation of the above.  Patient reports ongoing left shoulder pain.  Pain is located laterally.  Has been going on for quite some time.  Has improved but is still not resolved.  2-week history of right hip pain.  She localizes the pain to the low back and right pelvis.  No groin pain.  She has a lot of difficulty getting up out of a seated position.  Reports significant pain.  No relieving factors.  Additionally, patient states that her anxiety is overall stable.  She does report a lot of underlying stressors.  Requesting refill on alprazolam .  Patient Active Problem List   Diagnosis Date Noted   Acute right-sided low back pain with right-sided sciatica 04/16/2024   Left shoulder pain 01/14/2024   Lichen sclerosus et atrophicus 07/11/2023   Anxiety 03/03/2021   Osteoporosis without current pathological fracture 08/29/2020   Prediabetes 09/08/2017   S/P shoulder replacement 03/11/2014   Essential hypertension, benign 03/06/2011   Mixed hyperlipidemia 03/06/2011   Asthma 03/06/2011    Social Hx   Social History   Socioeconomic History   Marital status: Married    Spouse name: Not on file   Number of children: Not on file   Years of education: Not on file   Highest education level: 12th grade  Occupational History   Occupation: English As A Second Language Teacher    Comment: Retired  Tobacco Use   Smoking status: Never   Smokeless tobacco: Never  Vaping Use   Vaping status: Never Used  Substance and Sexual Activity   Alcohol use: No   Drug use: No   Sexual activity: Not Currently    Birth control/protection: Post-menopausal, Surgical    Comment: tubal  Other Topics Concern    Not on file  Social History Narrative   Not on file   Social Drivers of Health   Tobacco Use: Low Risk (01/14/2024)   Patient History    Smoking Tobacco Use: Never    Smokeless Tobacco Use: Never    Passive Exposure: Not on file  Financial Resource Strain: Low Risk (07/11/2023)   Overall Financial Resource Strain (CARDIA)    Difficulty of Paying Living Expenses: Not hard at all  Food Insecurity: No Food Insecurity (07/11/2023)   Hunger Vital Sign    Worried About Running Out of Food in the Last Year: Never true    Ran Out of Food in the Last Year: Never true  Transportation Needs: No Transportation Needs (07/11/2023)   PRAPARE - Administrator, Civil Service (Medical): No    Lack of Transportation (Non-Medical): No  Physical Activity: Inactive (07/11/2023)   Exercise Vital Sign    Days of Exercise per Week: 0 days    Minutes of Exercise per Session: 0 min  Stress: No Stress Concern Present (07/11/2023)   [employer] Occupational [employer] - Occupational Stress Questionnaire    Feeling of Stress : Only a little  Recent Concern: Stress - Stress Concern Present (06/21/2023)   [employer] Occupational [employer] - Occupational Stress Questionnaire    Feeling of Stress : To some extent  Social Connections: Socially Integrated (07/11/2023)  Social Advertising Account Executive    Frequency of Communication with Friends and Family: Twice a week    Frequency of Social Gatherings with Friends and Family: Once a week    Attends Religious Services: More than 4 times per year    Active Member of Clubs or Organizations: Yes    Attends Banker Meetings: More than 4 times per year    Marital Status: Married  Depression (PHQ2-9): Medium Risk (04/15/2024)   Depression (PHQ2-9)    PHQ-2 Score: 9  Alcohol Screen: Low Risk (07/11/2023)   Alcohol Screen    Last Alcohol Screening Score (AUDIT): 0  Housing: Low Risk (07/11/2023)   Housing Stability Vital Sign    Unable to  Pay for Housing in the Last Year: No    Number of Times Moved in the Last Year: 0    Homeless in the Last Year: No  Utilities: Not At Risk (07/11/2023)   [employer] Utilities    Threatened with loss of utilities: No  Health Literacy: Adequate Health Literacy (06/21/2023)   B1300 Health Literacy    Frequency of need for help with medical instructions: Never    Review of Systems Per HPI  Objective:  BP 138/85 (BP Location: Left Arm, Patient Position: Sitting)   Pulse (!) 52   Temp (!) 97.4 F (36.3 C)   Ht 5' 3 (1.6 m)   Wt 163 lb 4 oz (74 kg)   SpO2 99%   BMI 28.92 kg/m      04/15/2024    2:50 PM 01/14/2024    9:53 AM 08/13/2023    2:43 PM  BP/Weight  Systolic BP 138 131 142  Diastolic BP 85 81 76  Wt. (Lbs) 163.25 164 163  BMI 28.92 kg/m2 29.05 kg/m2 28.87 kg/m2    Physical Exam Vitals and nursing note reviewed.  Constitutional:      General: She is not in acute distress.    Appearance: Normal appearance.  Cardiovascular:     Rate and Rhythm: Normal rate and regular rhythm.  Pulmonary:     Effort: Pulmonary effort is normal.     Breath sounds: Normal breath sounds. No wheezing, rhonchi or rales.  Musculoskeletal:     Comments: Normal range of motion of the left hip.  Neurological:     Mental Status: She is alert.     Lab Results  Component Value Date   WBC 4.4 03/01/2023   HGB 15.2 03/01/2023   HCT 45.3 03/01/2023   PLT 237 03/01/2023   GLUCOSE 104 (H) 08/08/2023   CHOL 215 (H) 08/08/2023   TRIG 84 08/08/2023   HDL 56 08/08/2023   LDLCALC 144 (H) 08/08/2023   ALT 41 (H) 08/08/2023   AST 27 08/08/2023   NA 140 08/08/2023   K 4.6 08/08/2023   CL 104 08/08/2023   CREATININE 0.71 08/08/2023   BUN 15 08/08/2023   CO2 21 08/08/2023   TSH 2.320 02/11/2019   HGBA1C 6.1 (H) 08/08/2023     Assessment & Plan:  Acute right-sided low back pain with right-sided sciatica Assessment & Plan: Patient's clinical picture most consistent with low back pain.  X-rays  today for further evaluation.  Diclofenac as prescribed.  Orders: -     Diclofenac Sodium; Take 1 tablet (75 mg total) by mouth 2 (two) times daily as needed for moderate pain (pain score 4-6).  Dispense: 60 tablet; Refill: 1 -     DG Lumbar Spine Complete  Anxiety Assessment & Plan:  Stable.  Her present refilled.  Orders: -     ALPRAZolam ; TAKE 1/2 TO 1 (ONE-HALF TO ONE) TABLET BY MOUTH TWICE DAILY AS NEEDED FOR ANXIETY  Dispense: 30 tablet; Refill: 0  Chronic left shoulder pain Assessment & Plan: X-ray for further relation.  Diclofenac as prescribed.  Orders: -     Diclofenac Sodium; Take 1 tablet (75 mg total) by mouth 2 (two) times daily as needed for moderate pain (pain score 4-6).  Dispense: 60 tablet; Refill: 1 -     DG Shoulder Left  Essential hypertension, benign Assessment & Plan: Stable.     Follow-up: Pending x-rays  Jacqulyn Ahle DO [facility]

## 2024-04-16 NOTE — Assessment & Plan Note (Signed)
 Stable.  Her present refilled.

## 2024-04-16 NOTE — Assessment & Plan Note (Signed)
 Patient's clinical picture most consistent with low back pain.  X-rays today for further evaluation.  Diclofenac as prescribed.

## 2024-04-20 ENCOUNTER — Other Ambulatory Visit: Payer: Self-pay | Admitting: Family Medicine

## 2024-04-20 DIAGNOSIS — G8929 Other chronic pain: Secondary | ICD-10-CM

## 2024-04-20 DIAGNOSIS — M25512 Pain in left shoulder: Secondary | ICD-10-CM

## 2024-04-21 ENCOUNTER — Telehealth: Payer: Self-pay | Admitting: *Deleted

## 2024-04-21 ENCOUNTER — Other Ambulatory Visit: Payer: Self-pay | Admitting: Family Medicine

## 2024-04-21 DIAGNOSIS — G8929 Other chronic pain: Secondary | ICD-10-CM

## 2024-04-21 NOTE — Telephone Encounter (Signed)
 Copied from CRM #[id number]. Topic: Referral - Question >> Apr 21, 2024  9:31 AM Breanna Mason wrote: Reason for CRM: PT called in to see about referral to ortho surgery. Pt would like to know if she is going to get surgery and also pt would like the location in [place] and not off of [address]   [name] Address: [address], [place], [place] [address] Phone: [phone]   Pls follow up with pt before end of day

## 2024-04-21 NOTE — Telephone Encounter (Signed)
 Cook, Jayce G, DO    04/21/24 12:40 PM Surgery depends on MRI findings and orthopedic opinion.  Will place another referral.

## 2024-04-24 ENCOUNTER — Other Ambulatory Visit: Payer: Self-pay | Admitting: Family Medicine

## 2024-06-26 ENCOUNTER — Ambulatory Visit: Payer: 59
